# Patient Record
Sex: Male | Born: 1960 | Race: White | Hispanic: No | Marital: Single | Smoking: Former smoker
Health system: Southern US, Community
[De-identification: ages and names within clinical notes are randomized; demographics above are authoritative.]

## PROBLEM LIST (undated history)

## (undated) DIAGNOSIS — K219 Gastro-esophageal reflux disease without esophagitis: Secondary | ICD-10-CM

## (undated) DIAGNOSIS — I214 Non-ST elevation (NSTEMI) myocardial infarction: Secondary | ICD-10-CM

## (undated) DIAGNOSIS — D649 Anemia, unspecified: Secondary | ICD-10-CM

## (undated) DIAGNOSIS — B1920 Unspecified viral hepatitis C without hepatic coma: Secondary | ICD-10-CM

## (undated) DIAGNOSIS — M1990 Unspecified osteoarthritis, unspecified site: Secondary | ICD-10-CM

## (undated) DIAGNOSIS — I5021 Acute systolic (congestive) heart failure: Secondary | ICD-10-CM

## (undated) DIAGNOSIS — R002 Palpitations: Secondary | ICD-10-CM

## (undated) DIAGNOSIS — E785 Hyperlipidemia, unspecified: Secondary | ICD-10-CM

## (undated) DIAGNOSIS — C801 Malignant (primary) neoplasm, unspecified: Secondary | ICD-10-CM

## (undated) HISTORY — DX: Unspecified viral hepatitis C without hepatic coma: B19.20

## (undated) HISTORY — PX: ARTHROSCOPIC REPAIR ACL: SUR80

---

## 1998-03-09 ENCOUNTER — Ambulatory Visit: Admission: RE | Admit: 1998-03-09 | Discharge: 1998-03-09 | Payer: Self-pay | Admitting: Critical Care Medicine

## 2001-12-04 ENCOUNTER — Encounter: Payer: Self-pay | Admitting: Urology

## 2001-12-04 ENCOUNTER — Encounter: Admission: RE | Admit: 2001-12-04 | Discharge: 2001-12-04 | Payer: Self-pay | Admitting: Urology

## 2005-07-26 ENCOUNTER — Ambulatory Visit (HOSPITAL_BASED_OUTPATIENT_CLINIC_OR_DEPARTMENT_OTHER): Admission: RE | Admit: 2005-07-26 | Discharge: 2005-07-26 | Payer: Self-pay | Admitting: Otolaryngology

## 2005-07-29 ENCOUNTER — Ambulatory Visit: Payer: Self-pay | Admitting: Internal Medicine

## 2009-02-28 ENCOUNTER — Encounter: Admission: RE | Admit: 2009-02-28 | Discharge: 2009-02-28 | Payer: Self-pay | Admitting: Gastroenterology

## 2010-07-28 NOTE — Procedures (Signed)
NAMEMARCELO, [name]               ACCOUNT NO.:  [id number]   MEDICAL RECORD NO.:  [id number]          PATIENT TYPE:  OUT   LOCATION:  SLEEP CENTER                 FACILITY:  [facility]   PHYSICIAN:  Clinton D. Maple Hudson, M.D. DATE OF BIRTH:  1960/09/27   DATE OF STUDY:  07/26/2005                              NOCTURNAL POLYSOMNOGRAM   REFERRING PHYSICIAN:  Dr. Hermelinda Medicus   INDICATION FOR STUDY:  Hypersomnia with sleep apnea.   EPWORTH SLEEPINESS SCORE:  14/24, BMI 28.4.  Weight 199 pounds.   HOME MEDICATIONS:  Famvir.  A diagnostic NPSG protocol was requested.   INDICATION FOR STUDY:  Total sleep time 408 minutes with sleep efficiency  89%.  Stage I was 7%, stage II 76%, stages III and IV absent, REM 17% of  total sleep time.  Sleep latency 15 minutes, REM latency 72 minutes, awake  after sleep onset 35 minutes, arousal index 41 indicating increased  fragmentation.  No bedtime medication was taken.   RESPIRATORY DATA:  Apnea/hypopnea index (AHI, RDI) 45.9 obstructive events  per hour indicating moderately severe obstructive sleep apnea/hypopnea  syndrome.  This reflected 281 obstructive apneas and 31 hypopneas.  Events  were not positional.  REM AHI 43.1 per hour.  NPSG protocol was followed.   OXYGEN DATA:  Very loud snoring with oxygen desaturation to a nadir of 72%.  Mean oxygen saturation through the study was 91% on room air.   CARDIAC DATA:  Normal sinus rhythm with PVCs.   MOVEMENT/PARASOMNIA:  A total of 98 limb jerks were recorded of which 15  were associated with arousal or awakening for periodic limb movement with  arousal index of 2.2 per hour which is minimally increased and of doubtful  significance.   IMPRESSION/RECOMMENDATION:  1.  Moderately severe obstructive sleep apnea/hypopnea syndrome, AHI 45.9      per hour with nonpositional events,      loud snoring, and oxygen desaturation to 72%.  2.  Consider return for CPAP titration or evaluate for alternative  therapies      as appropriate.      Clinton D. Maple Hudson, M.D.  Diplomate, Biomedical engineer of Sleep Medicine  Electronically Signed     CDY/MEDQ  D:  07/29/2005 15:13:57  T:  07/30/2005 12:15:56  Job:  [id number]

## 2011-03-16 ENCOUNTER — Other Ambulatory Visit: Payer: Self-pay | Admitting: Gastroenterology

## 2011-03-16 DIAGNOSIS — B182 Chronic viral hepatitis C: Secondary | ICD-10-CM

## 2011-03-19 ENCOUNTER — Ambulatory Visit
Admission: RE | Admit: 2011-03-19 | Discharge: 2011-03-19 | Disposition: A | Discharge status: Home / Self Care | Payer: BC Managed Care – PPO | Source: Ambulatory Visit | Attending: Gastroenterology | Admitting: Gastroenterology

## 2011-03-19 DIAGNOSIS — B182 Chronic viral hepatitis C: Secondary | ICD-10-CM

## 2011-08-13 ENCOUNTER — Telehealth: Payer: Self-pay | Admitting: *Deleted

## 2011-08-13 ENCOUNTER — Telehealth: Payer: Self-pay | Admitting: Internal Medicine

## 2011-08-13 NOTE — Telephone Encounter (Signed)
pt aware of new pt appt for 6/12

## 2011-08-13 NOTE — Telephone Encounter (Signed)
Rec'd referral and call from [facility] with Dr Isabel Caprice, that patient needs a phlebotomy. Informed James [name] that patient will need to establish with Hematologist here before this can be determined. 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 (17.9g/52.5%) Called and informed patient that we would schedule consult appt, and to make sure he eats and drinks well before visit in the event that our attending MD wants him to have a phlebotomy on same day. Patient verbalized understanding. Orders to scheduling, patient request Wednesday afternoons if possible, due to work obligations.

## 2011-08-16 ENCOUNTER — Telehealth: Payer: Self-pay | Admitting: Internal Medicine

## 2011-08-16 NOTE — Telephone Encounter (Signed)
Referred by Dr. Barron Alvine Dx- PCV

## 2011-08-22 ENCOUNTER — Ambulatory Visit (HOSPITAL_BASED_OUTPATIENT_CLINIC_OR_DEPARTMENT_OTHER): Payer: BC Managed Care – PPO

## 2011-08-22 ENCOUNTER — Telehealth: Payer: Self-pay | Admitting: Internal Medicine

## 2011-08-22 ENCOUNTER — Ambulatory Visit: Payer: BC Managed Care – PPO

## 2011-08-22 ENCOUNTER — Other Ambulatory Visit (HOSPITAL_BASED_OUTPATIENT_CLINIC_OR_DEPARTMENT_OTHER): Payer: BC Managed Care – PPO | Admitting: Lab

## 2011-08-22 ENCOUNTER — Other Ambulatory Visit: Payer: Self-pay | Admitting: Internal Medicine

## 2011-08-22 ENCOUNTER — Ambulatory Visit (HOSPITAL_BASED_OUTPATIENT_CLINIC_OR_DEPARTMENT_OTHER): Payer: BC Managed Care – PPO | Admitting: Internal Medicine

## 2011-08-22 ENCOUNTER — Telehealth: Payer: Self-pay | Admitting: *Deleted

## 2011-08-22 VITALS — BP 128/79 | HR 96 | Temp 98.4°F

## 2011-08-22 VITALS — BP 130/80 | HR 86 | Temp 98.3°F | Ht 69.0 in | Wt 196.7 lb

## 2011-08-22 DIAGNOSIS — D751 Secondary polycythemia: Secondary | ICD-10-CM

## 2011-08-22 DIAGNOSIS — D45 Polycythemia vera: Secondary | ICD-10-CM

## 2011-08-22 LAB — CBC & DIFF AND RETIC
BASO%: 0.1 % (ref 0.0–2.0)
Basophils Absolute: 0 10*3/uL (ref 0.0–0.1)
EOS%: 1 % (ref 0.0–7.0)
Eosinophils Absolute: 0.1 10*3/uL (ref 0.0–0.5)
HCT: 50.6 % — ABNORMAL HIGH (ref 38.4–49.9)
HGB: 17.9 g/dL — ABNORMAL HIGH (ref 13.0–17.1)
Immature Retic Fract: 6.2 % (ref 3.00–10.60)
LYMPH%: 17.2 % (ref 14.0–49.0)
MCH: 31.7 pg (ref 27.2–33.4)
MCHC: 35.4 g/dL (ref 32.0–36.0)
MCV: 89.7 fL (ref 79.3–98.0)
MONO#: 0.5 10*3/uL (ref 0.1–0.9)
MONO%: 6.6 % (ref 0.0–14.0)
NEUT#: 5.1 10*3/uL (ref 1.5–6.5)
NEUT%: 75.1 % — ABNORMAL HIGH (ref 39.0–75.0)
Platelets: 212 10*3/uL (ref 140–400)
RBC: 5.64 10*6/uL (ref 4.20–5.82)
RDW: 13.5 % (ref 11.0–14.6)
Retic %: 1.29 % (ref 0.80–1.80)
Retic Ct Abs: 72.76 10*3/uL (ref 34.80–93.90)
WBC: 6.8 10*3/uL (ref 4.0–10.3)
lymph#: 1.2 10*3/uL (ref 0.9–3.3)
nRBC: 0 % (ref 0–0)

## 2011-08-22 LAB — COMPREHENSIVE METABOLIC PANEL
ALT: 33 U/L (ref 0–53)
AST: 27 U/L (ref 0–37)
Albumin: 4 g/dL (ref 3.5–5.2)
Alkaline Phosphatase: 51 U/L (ref 39–117)
BUN: 22 mg/dL (ref 6–23)
CO2: 23 mEq/L (ref 19–32)
Calcium: 9.1 mg/dL (ref 8.4–10.5)
Chloride: 107 mEq/L (ref 96–112)
Creatinine, Ser: 1.08 mg/dL (ref 0.50–1.35)
Glucose, Bld: 96 mg/dL (ref 70–99)
Potassium: 4.1 mEq/L (ref 3.5–5.3)
Sodium: 138 mEq/L (ref 135–145)
Total Bilirubin: 0.5 mg/dL (ref 0.3–1.2)
Total Protein: 6.5 g/dL (ref 6.0–8.3)

## 2011-08-22 LAB — LACTATE DEHYDROGENASE: LDH: 154 U/L (ref 94–250)

## 2011-08-22 NOTE — Telephone Encounter (Signed)
Per staff message I have schedule appts.  JMW

## 2011-08-22 NOTE — Progress Notes (Signed)
Add on phlebotomy today. Pt. Saw Dr. Arbutus Ped.  Written and verbal info. Reviewed with pt. UJ:[id number]. 500g phlebotomized using phlebotomy set and tolerated well.  HL

## 2011-08-22 NOTE — Progress Notes (Signed)
Post phlebotomy: V/s stable.  Discharged in to see scheduling for appts.

## 2011-08-22 NOTE — Telephone Encounter (Signed)
Emailed Michelle regarding phlebotomy °

## 2011-08-22 NOTE — Telephone Encounter (Signed)
Gave pt appt calendar for July 2013 lab, MD, and phlebotomy

## 2011-08-22 NOTE — Patient Instructions (Addendum)
Therapeutic Phlebotomy Therapeutic phlebotomy is the controlled removal of blood from your body for the purpose of treating a medical condition. It is similar to donating blood. Usually, about a pint (470 mL) of blood is removed. The average adult has 9 to 12 pints (4.3 to 5.7 L) of blood. Therapeutic phlebotomy may be used to treat the following medical conditions:  Hemochromatosis. This is a condition in which there is too much iron in the blood.   Polycythemia vera. This is a condition in which there are too many red cells in the blood.   Porphyria cutanea tarda. This is a disease usually passed from one generation to the next (inherited). It is a condition in which an important part of hemoglobin is not made properly. This results in the build up of abnormal amounts of porphyrins in the body.   Sickle cell disease. This is an inherited disease. It is a condition in which the red blood cells form an abnormal crescent shape rather than a round shape.  LET YOUR CAREGIVER KNOW ABOUT:  Allergies.   Medicines taken including herbs, eyedrops, over-the-counter medicines, and creams.   Use of steroids (by mouth or creams).   Previous problems with anesthetics or numbing medicine.   History of blood clots.   History of bleeding or blood problems.   Previous surgery.   Possibility of pregnancy, if this applies.  RISKS AND COMPLICATIONS This is a simple and safe procedure. Problems are unlikely. However, problems can occur and may include:  Nausea or lightheadedness.   Low blood pressure.   Soreness, bleeding, swelling, or bruising at the needle insertion site.   Infection.  BEFORE THE PROCEDURE  This is a procedure that can be done as an outpatient. Confirm the time that you need to arrive for your procedure. Confirm whether there is a need to fast or withhold any medications. It is helpful to wear clothing with sleeves that can be raised above the elbow. A blood sample may be done  to determine the amount of red blood cells or iron in your blood. Plan ahead of time to have someone drive you home after the procedure. PROCEDURE The entire procedure from preparation through recovery takes about 1 hour. The actual collection takes about 10 to 15 minutes.  A needle will be inserted into your vein.   Tubing and a collection bag will be attached to that needle.   Blood will flow through the needle and tubing into the collection bag.   You may be asked to open and close your hand slowly and continuously during the entire collection.   Once the specified amount of blood has been removed from your body, the collection bag and tubing will be clamped.   The needle will be removed.   Pressure will be held on the site of the needle insertion to stop the bleeding. Then a bandage will be placed over the needle insertion site.  AFTER THE PROCEDURE  Your recovery will be assessed and monitored. If there are no problems, as an outpatient, you should be able to go home shortly after the procedure.  Document Released: 07/31/2010 Document Revised: 02/15/2011 Document Reviewed: 07/31/2010 [facility] Patient Information 2012 [place], [place].   PLEASE CALL [facility] [phone] IF YOU HAVE FURTHER QUESTIONS OR CALL 911 AND GO TO THE NEAREST EMERGENCY ROOM FOR ANY TYPE OF EMERGENCY ISSUES.

## 2011-08-22 NOTE — Progress Notes (Signed)
[facility] Telephone:([phone]) [phone]   Fax:([phone]) [phone]  CONSULT NOTE  REASON FOR CONSULTATION:  51 years old white male with questionable polycythemia vera  HPI James [name] is a 51 y.o. male was past medical history significant for hypogonadism currently on androgen gell and testosterone. He also has a history of benign prostatic hypertrophy, erectile dysfunction and hepatitis C after transfusion from an accident several years ago. The patient is followed by Dr. Isabel Caprice for evaluation of his hypogonadism and on routine blood work was found to have elevated hemoglobin and hematocrit suspicious for polycythemia vera. He was referred to me today for further evaluation and recommendation regarding this abnormality. The patient is feeling fine with no specific complaints. He denied having any fatigue weakness. No history of thrombosis or bleeding. He has no headache or visual changes. Ultrasound of the abdomen on 03/19/2011 showed normal spleen size and echotexture. On 04/16/2011 his hemoglobin was 17.9 and hematocrit of 52.5%. The patient has no family history of blood disease or polycythemia vera.  Past medical history significant for: #1 hypogonadism. #2 benign prostatic hypertrophy #3 erectile dysfunction #4 history of hepatitis C after transfusion The patient denied having any history of hypertension, diabetes mellitus, coronary artery disease or stroke.  Family history: Mother is alive and healthy, father has coronary artery disease.  Social history: The patient is separated, he has 2 daughters. He works as a Administrator. He has remote history of smoking and quit 15 years ago, no history of alcohol or drug abuse. He is very active and exercise on a regular basis.  No Known Allergies  Current Outpatient Prescriptions  Medication Sig Dispense Refill  . Aspirin-Acetaminophen-Caffeine (GOODY HEADACHE PO) Take by mouth as needed.        Review of  Systems  A comprehensive review of systems was negative.  Physical Exam  AVW:[id number], healthy, no distress, well nourished and well developed SKIN: skin color, texture, turgor are normal HEAD: Normocephalic, No masses, lesions, tenderness or abnormalities EYES: normal EARS: External ears normal OROPHARYNX:no exudate and no erythema  NECK: supple, no adenopathy LYMPH:  no palpable lymphadenopathy, no hepatosplenomegaly LUNGS: clear to auscultation  HEART: regular rate & rhythm, no murmurs and no gallops ABDOMEN:abdomen soft, non-tender, normal bowel sounds and no masses or organomegaly BACK: Back symmetric, no curvature. EXTREMITIES:no joint deformities, effusion, or inflammation, no edema, no skin discoloration, no clubbing, no cyanosis  NEURO: alert & oriented x 3 with fluent speech, no focal motor/sensory deficits, gait normal  PERFORMANCE STATUS: ECOG 0  Studies/Results: CBC today showed white blood count 6.8, hemoglobin 17.9, hematocrit 50.6% and platelets count 212,000.  ASSESSMENT: This is a very pleasant 51 years old white male with polycythemia most likely reactive in nature secondary to hormonal treatment with testosterone and AndroGel but he cannot rule out polycythemia vera at this point.  PLAN: I have a lengthy discussion with the patient about his condition. I ordered several studies to evaluate the etiology of his polycythemia including repeat CBC, erythropoietin level, iron study and ferritin in addition to JAK-2 mutation. I will arrange for the patient to have a phlebotomy performed today with a target hematocrit of 45%.   The patient would come back for followup visit in 3 weeks for reevaluation and repeat phlebotomy as needed. I give the patient the time to ask questions and I answered them completely to his satisfaction.  All questions were answered. The patient knows to call the clinic with any problems, questions or concerns.  We can certainly see the patient much  sooner if necessary.  Thank you so much for allowing me to participate in the care of James [name]. I will continue to follow up the patient with you and assist in his care.  I spent 30 minutes counseling the patient face to face. The total time spent in the appointment was 55 minutes.  Kayton Ripp K. 08/22/2011, 10:49 AM

## 2011-08-23 LAB — IRON AND TIBC
%SAT: 35 % (ref 20–55)
Iron: 118 ug/dL (ref 42–165)
TIBC: 334 ug/dL (ref 215–435)
UIBC: 216 ug/dL (ref 125–400)

## 2011-08-23 LAB — ERYTHROPOIETIN: Erythropoietin: 26 m[IU]/mL (ref 2.6–34.0)

## 2011-08-23 LAB — FERRITIN: Ferritin: 148 ng/mL (ref 22–322)

## 2011-09-03 ENCOUNTER — Telehealth: Payer: Self-pay | Admitting: Internal Medicine

## 2011-09-03 NOTE — Telephone Encounter (Signed)
Talked to pt, he is aware of all appt in July 2013, lab, ML and phlebotomy

## 2011-09-12 ENCOUNTER — Telehealth: Payer: Self-pay | Admitting: Internal Medicine

## 2011-09-12 ENCOUNTER — Ambulatory Visit (HOSPITAL_BASED_OUTPATIENT_CLINIC_OR_DEPARTMENT_OTHER): Payer: BC Managed Care – PPO | Admitting: Physician Assistant

## 2011-09-12 ENCOUNTER — Ambulatory Visit (HOSPITAL_BASED_OUTPATIENT_CLINIC_OR_DEPARTMENT_OTHER): Payer: BC Managed Care – PPO

## 2011-09-12 ENCOUNTER — Other Ambulatory Visit (HOSPITAL_BASED_OUTPATIENT_CLINIC_OR_DEPARTMENT_OTHER): Payer: BC Managed Care – PPO | Admitting: Lab

## 2011-09-12 ENCOUNTER — Encounter: Payer: Self-pay | Admitting: Physician Assistant

## 2011-09-12 VITALS — BP 130/80 | HR 82 | Temp 98.7°F | Ht 69.0 in | Wt 194.7 lb

## 2011-09-12 DIAGNOSIS — D751 Secondary polycythemia: Secondary | ICD-10-CM

## 2011-09-12 DIAGNOSIS — D45 Polycythemia vera: Secondary | ICD-10-CM

## 2011-09-12 LAB — CBC WITH DIFFERENTIAL/PLATELET
BASO%: 0.4 % (ref 0.0–2.0)
Basophils Absolute: 0 10*3/uL (ref 0.0–0.1)
EOS%: 1 % (ref 0.0–7.0)
Eosinophils Absolute: 0.1 10*3/uL (ref 0.0–0.5)
HCT: 49 % (ref 38.4–49.9)
HGB: 16.8 g/dL (ref 13.0–17.1)
LYMPH%: 14.8 % (ref 14.0–49.0)
MCH: 32.2 pg (ref 27.2–33.4)
MCHC: 34.3 g/dL (ref 32.0–36.0)
MCV: 93.7 fL (ref 79.3–98.0)
MONO#: 0.5 10*3/uL (ref 0.1–0.9)
MONO%: 7.5 % (ref 0.0–14.0)
NEUT#: 5.5 10*3/uL (ref 1.5–6.5)
NEUT%: 76.3 % — ABNORMAL HIGH (ref 39.0–75.0)
Platelets: 225 10*3/uL (ref 140–400)
RBC: 5.23 10*6/uL (ref 4.20–5.82)
RDW: 13.9 % (ref 11.0–14.6)
WBC: 7.2 10*3/uL (ref 4.0–10.3)
lymph#: 1.1 10*3/uL (ref 0.9–3.3)

## 2011-09-12 NOTE — Progress Notes (Signed)
One unit of blood drained from left AC w/o difficulty. Pt tolerated well.  Pt ate snack, sandwich, crackers cheese and drank a coke.

## 2011-09-12 NOTE — Patient Instructions (Addendum)
Therapeutic Phlebotomy Therapeutic phlebotomy is the controlled removal of blood from your body for the purpose of treating a medical condition. It is similar to donating blood. Usually, about a pint (470 mL) of blood is removed. The average adult has 9 to 12 pints (4.3 to 5.7 L) of blood. Therapeutic phlebotomy may be used to treat the following medical conditions:  Hemochromatosis. This is a condition in which there is too much iron in the blood.   Polycythemia vera. This is a condition in which there are too many red cells in the blood.   Porphyria cutanea tarda. This is a disease usually passed from one generation to the next (inherited). It is a condition in which an important part of hemoglobin is not made properly. This results in the build up of abnormal amounts of porphyrins in the body.   Sickle cell disease. This is an inherited disease. It is a condition in which the red blood cells form an abnormal crescent shape rather than a round shape.  LET YOUR CAREGIVER KNOW ABOUT:  Allergies.   Medicines taken including herbs, eyedrops, over-the-counter medicines, and creams.   Use of steroids (by mouth or creams).   Previous problems with anesthetics or numbing medicine.   History of blood clots.   History of bleeding or blood problems.   Previous surgery.   Possibility of pregnancy, if this applies.  RISKS AND COMPLICATIONS This is a simple and safe procedure. Problems are unlikely. However, problems can occur and may include:  Nausea or lightheadedness.   Low blood pressure.   Soreness, bleeding, swelling, or bruising at the needle insertion site.   Infection.  BEFORE THE PROCEDURE  This is a procedure that can be done as an outpatient. Confirm the time that you need to arrive for your procedure. Confirm whether there is a need to fast or withhold any medications. It is helpful to wear clothing with sleeves that can be raised above the elbow. A blood sample may be done  to determine the amount of red blood cells or iron in your blood. Plan ahead of time to have someone drive you home after the procedure. PROCEDURE The entire procedure from preparation through recovery takes about 1 hour. The actual collection takes about 10 to 15 minutes.  A needle will be inserted into your vein.   Tubing and a collection bag will be attached to that needle.   Blood will flow through the needle and tubing into the collection bag.   You may be asked to open and close your hand slowly and continuously during the entire collection.   Once the specified amount of blood has been removed from your body, the collection bag and tubing will be clamped.   The needle will be removed.   Pressure will be held on the site of the needle insertion to stop the bleeding. Then a bandage will be placed over the needle insertion site.  AFTER THE PROCEDURE  Your recovery will be assessed and monitored. If there are no problems, as an outpatient, you should be able to go home shortly after the procedure.  Document Released: 07/31/2010 Document Revised: 02/15/2011 Document Reviewed: 07/31/2010 [employer] Patient Information 2012 [employer]. 

## 2011-09-12 NOTE — Telephone Encounter (Signed)
Gave pt appt for July 31st lab, Ml and phlebotomy

## 2011-09-12 NOTE — Progress Notes (Signed)
No images are attached to the encounter. No scans are attached to the encounter. No scans are attached to the encounter. [facility] OFFICE PROGRESS NOTE  No primary provider on file. No primary provider on file.  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 male returns for a scheduled regular office visit for followup of his reactive polycythemia. He has done well since last being seen by Dr. Arbutus Ped. He denied any problems with headache blurred or double vision, no chest pain or shortness of breath. He continues on his testosterone without difficulty. His JAK tube was negative. His ferritin and erythropoietin and iron studies were all within normal range. So that he is reactive polycythemia. Our goal is to keep his hematocrit at 45% or less the phlebotomy on an as-needed basis. He voices no specific complaints today.   MEDICAL HISTORY:History reviewed. No pertinent past medical history.  ALLERGIES:   has no known allergies.  MEDICATIONS:  Current Outpatient Prescriptions  Medication Sig Dispense Refill  . Aspirin-Acetaminophen-Caffeine (GOODY HEADACHE PO) Take by mouth as needed.      . TESTIM 50 MG/5GM GEL       . valACYclovir (VALTREX) 500 MG tablet         SURGICAL HISTORY: History reviewed. No pertinent past surgical history.  REVIEW OF SYSTEMS:  A comprehensive review of systems was negative.   PHYSICAL EXAMINATION: General appearance: alert, cooperative, appears stated age and no distress Head: Normocephalic, without obvious abnormality, atraumatic Neck: no adenopathy, no carotid bruit, no JVD, supple, symmetrical, trachea midline and thyroid not enlarged, symmetric, no tenderness/mass/nodules Lymph nodes: Cervical, supraclavicular, and axillary nodes normal. Resp: clear to auscultation  bilaterally Cardio: regular rate and rhythm, S1, S2 normal, no murmur, click, rub or gallop GI: soft, non-tender; bowel sounds normal; no masses,  no organomegaly Extremities: extremities normal, atraumatic, no cyanosis or edema Neurologic: Alert and oriented X 3, normal strength and tone. Normal symmetric reflexes. Normal coordination and gait  ECOG PERFORMANCE STATUS: 0 - Asymptomatic  Blood pressure 130/80, pulse 82, temperature 98.7 F (37.1 C), temperature source Oral, height 5\' 9"  (1.753 m), weight 194 lb 11.2 oz (88.315 kg).  LABORATORY DATA: Lab Results  Component Value Date   WBC 7.2 09/12/2011   HGB 16.8 09/12/2011   HCT 49.0 09/12/2011   MCV 93.7 09/12/2011   PLT 225 09/12/2011      Chemistry      Component Value Date/Time   NA 138 08/22/2011 0923   K 4.1 08/22/2011 0923   CL 107 08/22/2011 0923   CO2 23 08/22/2011 0923   BUN 22 08/22/2011 0923   CREATININE 1.08 08/22/2011 0923      Component Value Date/Time   CALCIUM 9.1 08/22/2011 0923   ALKPHOS 51 08/22/2011 0923   AST 27 08/22/2011 0923   ALT 33 08/22/2011 0923   BILITOT 0.5 08/22/2011 0923       RADIOGRAPHIC STUDIES:  No results found.   ASSESSMENT/PLAN: The patient is a very pleasant 51 year old white male with reactive polycythemia. Patient was discussed with Dr. Arbutus Ped. His hematocrit today is 49% and he will require phlebotomy. We'll have her return in one month with a repeat CBC to reevaluate whether she will need further phlebotomy to reach his goals of hematocrit of 45% or less. We'll continue monthly checks and a phlebotomy as needed.  Laural Benes, Lalonnie Shaffer E, PA-C     All questions were answered. The patient knows to call the clinic with any problems, questions or concerns. We can certainly see the patient much sooner if necessary.  I spent 20 minutes counseling the patient face to face. The total time spent in the appointment was 30 minutes.

## 2011-10-10 ENCOUNTER — Ambulatory Visit (HOSPITAL_BASED_OUTPATIENT_CLINIC_OR_DEPARTMENT_OTHER): Payer: BC Managed Care – PPO | Admitting: Physician Assistant

## 2011-10-10 ENCOUNTER — Other Ambulatory Visit (HOSPITAL_BASED_OUTPATIENT_CLINIC_OR_DEPARTMENT_OTHER): Payer: BC Managed Care – PPO | Admitting: Lab

## 2011-10-10 ENCOUNTER — Ambulatory Visit (HOSPITAL_BASED_OUTPATIENT_CLINIC_OR_DEPARTMENT_OTHER): Payer: BC Managed Care – PPO

## 2011-10-10 VITALS — BP 135/76 | HR 84 | Temp 97.2°F | Ht 69.0 in | Wt 196.3 lb

## 2011-10-10 DIAGNOSIS — D45 Polycythemia vera: Secondary | ICD-10-CM

## 2011-10-10 DIAGNOSIS — D751 Secondary polycythemia: Secondary | ICD-10-CM

## 2011-10-10 LAB — CBC WITH DIFFERENTIAL/PLATELET
BASO%: 0.2 % (ref 0.0–2.0)
Basophils Absolute: 0 10*3/uL (ref 0.0–0.1)
EOS%: 1.6 % (ref 0.0–7.0)
Eosinophils Absolute: 0.1 10*3/uL (ref 0.0–0.5)
HCT: 50 % — ABNORMAL HIGH (ref 38.4–49.9)
HGB: 17 g/dL (ref 13.0–17.1)
LYMPH%: 18.2 % (ref 14.0–49.0)
MCH: 31.9 pg (ref 27.2–33.4)
MCHC: 34 g/dL (ref 32.0–36.0)
MCV: 93.8 fL (ref 79.3–98.0)
MONO#: 0.7 10*3/uL (ref 0.1–0.9)
MONO%: 8.3 % (ref 0.0–14.0)
NEUT#: 5.9 10*3/uL (ref 1.5–6.5)
NEUT%: 71.7 % (ref 39.0–75.0)
Platelets: 244 10*3/uL (ref 140–400)
RBC: 5.33 10*6/uL (ref 4.20–5.82)
RDW: 13.3 % (ref 11.0–14.6)
WBC: 8.2 10*3/uL (ref 4.0–10.3)
lymph#: 1.5 10*3/uL (ref 0.9–3.3)

## 2011-10-10 NOTE — Patient Instructions (Signed)
Patient aware of next appointment; discharged home with no complaints. 

## 2011-10-10 NOTE — Progress Notes (Signed)
Therapeutic range for Hct = > 45, per Dr. Arbutus Ped; Hct = 50 today; removed 500cc blood; patient tolerated well; pre and post vital signs stable.

## 2011-10-14 NOTE — Progress Notes (Signed)
No images are attached to the encounter. No scans are attached to the encounter. No scans are attached to the encounter. [facility] OFFICE PROGRESS NOTE  No primary provider on file. No primary provider on file.  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 male returns for a scheduled regular office visit for followup of his reactive polycythemia. He voices no complaints.  He denied any problems with headache blurred or double vision, no chest pain or shortness of breath. He continues on his testosterone without difficulty.  Our goal is to keep his hematocrit at 45% or less the phlebotomy on an as-needed basis. He voices no specific complaints today.   MEDICAL HISTORY:No past medical history on file.  ALLERGIES:   has no known allergies.  MEDICATIONS:  Current Outpatient Prescriptions  Medication Sig Dispense Refill  . Aspirin-Acetaminophen-Caffeine (GOODY HEADACHE PO) Take by mouth as needed.      . imiquimod (ALDARA) 5 % cream       . TESTIM 50 MG/5GM GEL       . valACYclovir (VALTREX) 500 MG tablet         SURGICAL HISTORY: No past surgical history on file.  REVIEW OF SYSTEMS:  A comprehensive review of systems was negative.   PHYSICAL EXAMINATION: General appearance: alert, cooperative, appears stated age and no distress Head: Normocephalic, without obvious abnormality, atraumatic Neck: no adenopathy, no carotid bruit, no JVD, supple, symmetrical, trachea midline and thyroid not enlarged, symmetric, no tenderness/mass/nodules Lymph nodes: Cervical, supraclavicular, and axillary nodes normal. Resp: clear to auscultation bilaterally Cardio: regular rate and rhythm, S1, S2 normal, no murmur, click, rub or gallop GI: soft, non-tender; bowel sounds normal; no masses,  no organomegaly Extremities:  extremities normal, atraumatic, no cyanosis or edema Neurologic: Alert and oriented X 3, normal strength and tone. Normal symmetric reflexes. Normal coordination and gait  ECOG PERFORMANCE STATUS: 0 - Asymptomatic  Blood pressure 135/76, pulse 84, temperature 97.2 F (36.2 C), temperature source Oral, height 5\' 9"  (1.753 m), weight 196 lb 4.8 oz (89.041 kg).  LABORATORY DATA: Lab Results  Component Value Date   WBC 8.2 10/10/2011   HGB 17.0 10/10/2011   HCT 50.0* 10/10/2011   MCV 93.8 10/10/2011   PLT 244 10/10/2011      Chemistry      Component Value Date/Time   NA 138 08/22/2011 0923   K 4.1 08/22/2011 0923   CL 107 08/22/2011 0923   CO2 23 08/22/2011 0923   BUN 22 08/22/2011 0923   CREATININE 1.08 08/22/2011 0923      Component Value Date/Time   CALCIUM 9.1 08/22/2011 0923   ALKPHOS 51 08/22/2011 0923   AST 27 08/22/2011 0923   ALT 33 08/22/2011 0923   BILITOT 0.5 08/22/2011 0923       RADIOGRAPHIC STUDIES:  No results found.   ASSESSMENT/PLAN: The patient is a very pleasant 51 year old white male with reactive polycythemia. Patient was discussed with Dr. Arbutus Ped. His hematocrit today is 50% and he will require phlebotomy. We'll have him return on a monthly basis with a CBC to reevaluate whether he will require phlebotomy and we'll also set up phlebotomy appointments. Should the hematocrit be greater than 45% he will require phlebotomy. He'll followup with Dr. Arbutus Ped in 3 months with repeat CBC and phlebotomy appointment as well.  James [name], James Pickelsimer E, PA-C     All questions were answered. The patient knows to call the clinic with any problems, questions or concerns. We can certainly see the patient much sooner if necessary.  I spent 20 minutes counseling the patient face to face. The total time spent in the appointment was 30 minutes.

## 2011-11-13 ENCOUNTER — Other Ambulatory Visit: Payer: BC Managed Care – PPO | Admitting: Lab

## 2011-11-13 ENCOUNTER — Telehealth: Payer: Self-pay | Admitting: Internal Medicine

## 2011-11-13 NOTE — Telephone Encounter (Signed)
pt called to r/s 9/3 lab to 9/11,done

## 2011-11-21 ENCOUNTER — Other Ambulatory Visit (HOSPITAL_BASED_OUTPATIENT_CLINIC_OR_DEPARTMENT_OTHER): Payer: BC Managed Care – PPO

## 2011-11-21 DIAGNOSIS — D751 Secondary polycythemia: Secondary | ICD-10-CM

## 2011-11-21 DIAGNOSIS — D45 Polycythemia vera: Secondary | ICD-10-CM

## 2011-11-21 LAB — CBC WITH DIFFERENTIAL/PLATELET
BASO%: 0.3 % (ref 0.0–2.0)
Basophils Absolute: 0 10*3/uL (ref 0.0–0.1)
EOS%: 1.7 % (ref 0.0–7.0)
Eosinophils Absolute: 0.1 10*3/uL (ref 0.0–0.5)
HCT: 47.6 % (ref 38.4–49.9)
HGB: 16.5 g/dL (ref 13.0–17.1)
LYMPH%: 23.7 % (ref 14.0–49.0)
MCH: 31 pg (ref 27.2–33.4)
MCHC: 34.7 g/dL (ref 32.0–36.0)
MCV: 89.3 fL (ref 79.3–98.0)
MONO#: 0.5 10*3/uL (ref 0.1–0.9)
MONO%: 6.5 % (ref 0.0–14.0)
NEUT#: 4.9 10*3/uL (ref 1.5–6.5)
NEUT%: 67.8 % (ref 39.0–75.0)
Platelets: 221 10*3/uL (ref 140–400)
RBC: 5.33 10*6/uL (ref 4.20–5.82)
RDW: 12.9 % (ref 11.0–14.6)
WBC: 7.2 10*3/uL (ref 4.0–10.3)
lymph#: 1.7 10*3/uL (ref 0.9–3.3)

## 2012-01-09 ENCOUNTER — Telehealth: Payer: Self-pay | Admitting: *Deleted

## 2012-01-09 ENCOUNTER — Ambulatory Visit (HOSPITAL_BASED_OUTPATIENT_CLINIC_OR_DEPARTMENT_OTHER): Payer: BC Managed Care – PPO | Admitting: Internal Medicine

## 2012-01-09 ENCOUNTER — Other Ambulatory Visit (HOSPITAL_BASED_OUTPATIENT_CLINIC_OR_DEPARTMENT_OTHER): Payer: BC Managed Care – PPO | Admitting: Lab

## 2012-01-09 ENCOUNTER — Ambulatory Visit (HOSPITAL_BASED_OUTPATIENT_CLINIC_OR_DEPARTMENT_OTHER): Payer: BC Managed Care – PPO

## 2012-01-09 VITALS — BP 120/72 | HR 81 | Temp 98.0°F | Resp 20 | Ht 69.0 in | Wt 202.0 lb

## 2012-01-09 DIAGNOSIS — D45 Polycythemia vera: Secondary | ICD-10-CM

## 2012-01-09 DIAGNOSIS — D751 Secondary polycythemia: Secondary | ICD-10-CM

## 2012-01-09 LAB — COMPREHENSIVE METABOLIC PANEL (CC13)
ALT: 35 U/L (ref 0–55)
AST: 25 U/L (ref 5–34)
Albumin: 3.7 g/dL (ref 3.5–5.0)
Alkaline Phosphatase: 60 U/L (ref 40–150)
BUN: 23 mg/dL (ref 7.0–26.0)
CO2: 24 mEq/L (ref 22–29)
Calcium: 9.3 mg/dL (ref 8.4–10.4)
Chloride: 107 mEq/L (ref 98–107)
Creatinine: 1 mg/dL (ref 0.7–1.3)
Glucose: 101 mg/dl — ABNORMAL HIGH (ref 70–99)
Potassium: 4.5 mEq/L (ref 3.5–5.1)
Sodium: 137 mEq/L (ref 136–145)
Total Bilirubin: 0.39 mg/dL (ref 0.20–1.20)
Total Protein: 6.7 g/dL (ref 6.4–8.3)

## 2012-01-09 LAB — CBC WITH DIFFERENTIAL/PLATELET
BASO%: 0.5 % (ref 0.0–2.0)
Basophils Absolute: 0 10*3/uL (ref 0.0–0.1)
EOS%: 2.2 % (ref 0.0–7.0)
Eosinophils Absolute: 0.2 10*3/uL (ref 0.0–0.5)
HCT: 50.5 % — ABNORMAL HIGH (ref 38.4–49.9)
HGB: 17.4 g/dL — ABNORMAL HIGH (ref 13.0–17.1)
LYMPH%: 21.1 % (ref 14.0–49.0)
MCH: 31.1 pg (ref 27.2–33.4)
MCHC: 34.5 g/dL (ref 32.0–36.0)
MCV: 90.1 fL (ref 79.3–98.0)
MONO#: 0.5 10*3/uL (ref 0.1–0.9)
MONO%: 7.4 % (ref 0.0–14.0)
NEUT#: 5 10*3/uL (ref 1.5–6.5)
NEUT%: 68.8 % (ref 39.0–75.0)
Platelets: 200 10*3/uL (ref 140–400)
RBC: 5.61 10*6/uL (ref 4.20–5.82)
RDW: 14.1 % (ref 11.0–14.6)
WBC: 7.3 10*3/uL (ref 4.0–10.3)
lymph#: 1.5 10*3/uL (ref 0.9–3.3)

## 2012-01-09 LAB — LACTATE DEHYDROGENASE (CC13): LDH: 199 U/L (ref 125–220)

## 2012-01-09 NOTE — Telephone Encounter (Signed)
Per staff message and POF I ave scheduled appts.  JMW  

## 2012-01-09 NOTE — Telephone Encounter (Signed)
gv and printed appt schedule for pt for NOV °

## 2012-01-09 NOTE — Progress Notes (Signed)
      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  PRIOR THERAPY: None   CURRENT THERAPY: Phlebotomy on an as-needed basis noted to keep the hematocrit at 45% or less   INTERVAL HISTORY: James [name] 51 y.o. male returns to the clinic today for routine two-month followup visit. The patient is feeling fine today with no specific complaints. He denied having any significant weight loss or night sweats. He denied having any chest pain, shortness breath, cough or hemoptysis. He has no bleeding issues or palpable lymphadenopathy. He has repeat CBC performed earlier today and he is here for evaluation and discussion of his lab results. He still on treatment with testosterone gel.  MEDICAL HISTORY:No past medical history on file.  ALLERGIES:   has no known allergies.  MEDICATIONS:  Current Outpatient Prescriptions  Medication Sig Dispense Refill  . Aspirin-Acetaminophen-Caffeine (GOODY HEADACHE PO) Take by mouth as needed.      . imiquimod (ALDARA) 5 % cream       . TESTIM 50 MG/5GM GEL       . valACYclovir (VALTREX) 500 MG tablet         REVIEW OF SYSTEMS:  A comprehensive review of systems was negative.   PHYSICAL EXAMINATION: General appearance: alert, cooperative and no distress Head: Normocephalic, without obvious abnormality, atraumatic Neck: no adenopathy Lymph nodes: Cervical, supraclavicular, and axillary nodes normal. Resp: clear to auscultation bilaterally Cardio: regular rate and rhythm, S1, S2 normal, no murmur, click, rub or gallop GI: soft, non-tender; bowel sounds normal; no masses,  no organomegaly Extremities: extremities normal, atraumatic, no cyanosis or edema  ECOG PERFORMANCE STATUS: 0 - Asymptomatic  Blood pressure 120/72, pulse 81, temperature 98 F (36.7 C), temperature source Oral, resp. rate 20, height 5\' 9"   (1.753 m), weight 202 lb (91.627 kg).  LABORATORY DATA: Lab Results  Component Value Date   WBC 7.3 01/09/2012   HGB 17.4* 01/09/2012   HCT 50.5* 01/09/2012   MCV 90.1 01/09/2012   PLT 200 01/09/2012      Chemistry      Component Value Date/Time   NA 138 08/22/2011 0923   K 4.1 08/22/2011 0923   CL 107 08/22/2011 0923   CO2 23 08/22/2011 0923   BUN 22 08/22/2011 0923   CREATININE 1.08 08/22/2011 0923      Component Value Date/Time   CALCIUM 9.1 08/22/2011 0923   ALKPHOS 51 08/22/2011 0923   AST 27 08/22/2011 0923   ALT 33 08/22/2011 0923   BILITOT 0.5 08/22/2011 0923       RADIOGRAPHIC STUDIES: No results found.  ASSESSMENT: This is a very pleasant 51 years old white male with reactive polycythemia most likely secondary to his hormonal treatment with testosterone gel. His hemoglobin and hematocrit are elevated today.  PLAN: I recommended for him to proceed with phlebotomy today to keep his hematocrit around 45%. I would see him back for followup visit in one month with repeat CBC and consideration of phlebotomy if his hematocrit is still elevated. The patient was advised to call immediately if he has any concerning symptoms in the interval.  All questions were answered. The patient knows to call the clinic with any problems, questions or concerns. We can certainly see the patient much sooner if necessary.

## 2012-01-09 NOTE — Patient Instructions (Signed)
Your hemoglobin and hematocrit are elevated today. We'll proceed with phlebotomy today and in one month

## 2012-01-09 NOTE — Progress Notes (Signed)
1458-Therapeutic phlebotomy complete.  516g blood removed.  Pt tolerated procedure well without complaints.  Nourishments provided-dhp, rn 1320- Pt states he feels well, would like to go home.  VSS.  D/c'd home-dhp, rn

## 2012-01-09 NOTE — Patient Instructions (Addendum)
Therapeutic Phlebotomy Therapeutic phlebotomy is the controlled removal of blood from your body for the purpose of treating a medical condition. It is similar to donating blood. Usually, about a pint (470 mL) of blood is removed. The average adult has 9 to 12 pints (4.3 to 5.7 L) of blood. Therapeutic phlebotomy may be used to treat the following medical conditions:  Hemochromatosis. This is a condition in which there is too much iron in the blood.  Polycythemia vera. This is a condition in which there are too many red cells in the blood.  Porphyria cutanea tarda. This is a disease usually passed from one generation to the next (inherited). It is a condition in which an important part of hemoglobin is not made properly. This results in the build up of abnormal amounts of porphyrins in the body.  Sickle cell disease. This is an inherited disease. It is a condition in which the red blood cells form an abnormal crescent shape rather than a round shape. LET YOUR CAREGIVER KNOW ABOUT:  Allergies.  Medicines taken including herbs, eyedrops, over-the-counter medicines, and creams.  Use of steroids (by mouth or creams).  Previous problems with anesthetics or numbing medicine.  History of blood clots.  History of bleeding or blood problems.  Previous surgery.  Possibility of pregnancy, if this applies. RISKS AND COMPLICATIONS This is a simple and safe procedure. Problems are unlikely. However, problems can occur and may include:  Nausea or lightheadedness.  Low blood pressure.  Soreness, bleeding, swelling, or bruising at the needle insertion site.  Infection. BEFORE THE PROCEDURE  This is a procedure that can be done as an outpatient. Confirm the time that you need to arrive for your procedure. Confirm whether there is a need to fast or withhold any medications. It is helpful to wear clothing with sleeves that can be raised above the elbow. A blood sample may be done to determine the  amount of red blood cells or iron in your blood. Plan ahead of time to have someone drive you home after the procedure. PROCEDURE The entire procedure from preparation through recovery takes about 1 hour. The actual collection takes about 10 to 15 minutes.  A needle will be inserted into your vein.  Tubing and a collection bag will be attached to that needle.  Blood will flow through the needle and tubing into the collection bag.  You may be asked to open and close your hand slowly and continuously during the entire collection.  Once the specified amount of blood has been removed from your body, the collection bag and tubing will be clamped.  The needle will be removed.  Pressure will be held on the site of the needle insertion to stop the bleeding. Then a bandage will be placed over the needle insertion site. AFTER THE PROCEDURE  Your recovery will be assessed and monitored. If there are no problems, as an outpatient, you should be able to go home shortly after the procedure.  Document Released: 07/31/2010 Document Revised: 05/21/2011 Document Reviewed: 07/31/2010 [employer] Patient Information 2013 [employer].  

## 2012-02-06 ENCOUNTER — Other Ambulatory Visit: Payer: BC Managed Care – PPO | Admitting: Lab

## 2012-02-06 ENCOUNTER — Ambulatory Visit: Payer: BC Managed Care – PPO | Admitting: Physician Assistant

## 2012-02-20 ENCOUNTER — Encounter: Payer: Self-pay | Admitting: Physician Assistant

## 2012-02-20 ENCOUNTER — Telehealth: Payer: Self-pay | Admitting: Internal Medicine

## 2012-02-20 ENCOUNTER — Other Ambulatory Visit (HOSPITAL_BASED_OUTPATIENT_CLINIC_OR_DEPARTMENT_OTHER): Payer: BC Managed Care – PPO | Admitting: Lab

## 2012-02-20 ENCOUNTER — Telehealth: Payer: Self-pay | Admitting: *Deleted

## 2012-02-20 ENCOUNTER — Ambulatory Visit (HOSPITAL_BASED_OUTPATIENT_CLINIC_OR_DEPARTMENT_OTHER): Payer: BC Managed Care – PPO | Admitting: Physician Assistant

## 2012-02-20 ENCOUNTER — Ambulatory Visit (HOSPITAL_BASED_OUTPATIENT_CLINIC_OR_DEPARTMENT_OTHER): Payer: BC Managed Care – PPO

## 2012-02-20 VITALS — BP 122/82 | HR 84 | Temp 98.1°F | Resp 18 | Ht 69.0 in | Wt 201.7 lb

## 2012-02-20 DIAGNOSIS — D751 Secondary polycythemia: Secondary | ICD-10-CM

## 2012-02-20 DIAGNOSIS — D45 Polycythemia vera: Secondary | ICD-10-CM

## 2012-02-20 LAB — CBC WITH DIFFERENTIAL/PLATELET
BASO%: 0.7 % (ref 0.0–2.0)
Basophils Absolute: 0 10*3/uL (ref 0.0–0.1)
EOS%: 1.8 % (ref 0.0–7.0)
Eosinophils Absolute: 0.1 10*3/uL (ref 0.0–0.5)
HCT: 49.3 % (ref 38.4–49.9)
HGB: 16.8 g/dL (ref 13.0–17.1)
LYMPH%: 20 % (ref 14.0–49.0)
MCH: 31.2 pg (ref 27.2–33.4)
MCHC: 34 g/dL (ref 32.0–36.0)
MCV: 91.8 fL (ref 79.3–98.0)
MONO#: 0.5 10*3/uL (ref 0.1–0.9)
MONO%: 6.6 % (ref 0.0–14.0)
NEUT#: 4.9 10*3/uL (ref 1.5–6.5)
NEUT%: 70.9 % (ref 39.0–75.0)
Platelets: 218 10*3/uL (ref 140–400)
RBC: 5.37 10*6/uL (ref 4.20–5.82)
RDW: 14.3 % (ref 11.0–14.6)
WBC: 6.9 10*3/uL (ref 4.0–10.3)
lymph#: 1.4 10*3/uL (ref 0.9–3.3)

## 2012-02-20 NOTE — Telephone Encounter (Signed)
Per staff message and POF I have scheduled appt.  JMW  

## 2012-02-20 NOTE — Progress Notes (Signed)
Phlebotomy performed via right antecubital vein. 500 cc removed without difficulty. Patient ate snack before procedure, tolerated procedure well.

## 2012-02-20 NOTE — Patient Instructions (Addendum)
Follow up in 1 month with repeat labs and possible phlebotomy

## 2012-02-20 NOTE — Patient Instructions (Addendum)

## 2012-02-20 NOTE — Telephone Encounter (Signed)
gv and printed appt schedule to pt for Jan 2014 °

## 2012-02-24 NOTE — Progress Notes (Signed)
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  PRIOR THERAPY: None   CURRENT THERAPY: Phlebotomy on an as-needed basis noted to keep the hematocrit at 45% or less   INTERVAL HISTORY: James [name] 51 y.o. male returns to the clinic today for a scheduled followup visit. The patient is feeling fine today with no specific complaints. He denied having any significant weight loss or night sweats. He denied having any chest pain, shortness breath, cough or hemoptysis. He has no bleeding issues or palpable lymphadenopathy. He has repeat CBC performed earlier today and he is here for evaluation and discussion of his lab results. He still on treatment with testosterone gel.  MEDICAL HISTORY:History reviewed. No pertinent past medical history.  ALLERGIES:   has no known allergies.  MEDICATIONS:  Current Outpatient Prescriptions  Medication Sig Dispense Refill  . Aspirin-Acetaminophen-Caffeine (GOODY HEADACHE PO) Take by mouth as needed.      . imiquimod (ALDARA) 5 % cream       . TESTIM 50 MG/5GM GEL       . valACYclovir (VALTREX) 500 MG tablet         REVIEW OF SYSTEMS:  A comprehensive review of systems was negative.   PHYSICAL EXAMINATION: General appearance: alert, cooperative and no distress Head: Normocephalic, without obvious abnormality, atraumatic Neck: no adenopathy Lymph nodes: Cervical, supraclavicular, and axillary nodes normal. Resp: clear to auscultation bilaterally Cardio: regular rate and rhythm, S1, S2 normal, no murmur, click, rub or gallop GI: soft, non-tender; bowel sounds normal; no masses,  no organomegaly Extremities: extremities normal, atraumatic, no cyanosis or edema  ECOG PERFORMANCE STATUS: 0 - Asymptomatic  Blood pressure 122/82, pulse 84, temperature 98.1 F (36.7 C), temperature source Oral, resp. rate  18, height 5\' 9"  (1.753 m), weight 201 lb 11.2 oz (91.491 kg).  LABORATORY DATA: Lab Results  Component Value Date   WBC 6.9 02/20/2012   HGB 16.8 02/20/2012   HCT 49.3 02/20/2012   MCV 91.8 02/20/2012   PLT 218 02/20/2012      Chemistry      Component Value Date/Time   NA 137 01/09/2012 1321   NA 138 08/22/2011 0923   K 4.5 01/09/2012 1321   K 4.1 08/22/2011 0923   CL 107 01/09/2012 1321   CL 107 08/22/2011 0923   CO2 24 01/09/2012 1321   CO2 23 08/22/2011 0923   BUN 23.0 01/09/2012 1321   BUN 22 08/22/2011 0923   CREATININE 1.0 01/09/2012 1321   CREATININE 1.08 08/22/2011 0923      Component Value Date/Time   CALCIUM 9.3 01/09/2012 1321   CALCIUM 9.1 08/22/2011 0923   ALKPHOS 60 01/09/2012 1321   ALKPHOS 51 08/22/2011 0923   AST 25 01/09/2012 1321   AST 27 08/22/2011 0923   ALT 35 01/09/2012 1321   ALT 33 08/22/2011 0923   BILITOT 0.39 01/09/2012 1321   BILITOT 0.5 08/22/2011 0923       RADIOGRAPHIC STUDIES: No results found.  ASSESSMENT/PLAN: This is a very pleasant 51 years old white male with reactive polycythemia most likely secondary to his hormonal treatment with testosterone gel. His hematocrit is elevated today at 49.3%. He will require phlebotomy today. The patient was discussed with Dr. Arbutus Ped. He will proceed with phlebotomy of one unit today and return in one month with repeat CBC differential and a platelet for possible further phlebotomy.  Laural Benes, Yaman Grauberger E, PA-C   All questions were answered. The patient knows to call the clinic with any problems, questions or concerns. We can certainly see the patient much sooner if necessary.

## 2012-03-24 ENCOUNTER — Ambulatory Visit (HOSPITAL_BASED_OUTPATIENT_CLINIC_OR_DEPARTMENT_OTHER): Payer: BC Managed Care – PPO | Admitting: Physician Assistant

## 2012-03-24 ENCOUNTER — Telehealth: Payer: Self-pay | Admitting: Internal Medicine

## 2012-03-24 ENCOUNTER — Other Ambulatory Visit (HOSPITAL_BASED_OUTPATIENT_CLINIC_OR_DEPARTMENT_OTHER): Payer: BC Managed Care – PPO

## 2012-03-24 ENCOUNTER — Telehealth: Payer: Self-pay | Admitting: *Deleted

## 2012-03-24 ENCOUNTER — Ambulatory Visit (HOSPITAL_BASED_OUTPATIENT_CLINIC_OR_DEPARTMENT_OTHER): Payer: BC Managed Care – PPO

## 2012-03-24 VITALS — BP 140/98 | HR 81 | Resp 20

## 2012-03-24 VITALS — BP 128/83 | HR 82 | Temp 98.7°F | Resp 20 | Ht 69.0 in | Wt 200.2 lb

## 2012-03-24 DIAGNOSIS — D751 Secondary polycythemia: Secondary | ICD-10-CM

## 2012-03-24 DIAGNOSIS — D45 Polycythemia vera: Secondary | ICD-10-CM

## 2012-03-24 LAB — CBC WITH DIFFERENTIAL/PLATELET
BASO%: 0.3 % (ref 0.0–2.0)
Basophils Absolute: 0 10*3/uL (ref 0.0–0.1)
EOS%: 1.8 % (ref 0.0–7.0)
Eosinophils Absolute: 0.1 10*3/uL (ref 0.0–0.5)
HCT: 48.9 % (ref 38.4–49.9)
HGB: 16.9 g/dL (ref 13.0–17.1)
LYMPH%: 20.8 % (ref 14.0–49.0)
MCH: 30.9 pg (ref 27.2–33.4)
MCHC: 34.6 g/dL (ref 32.0–36.0)
MCV: 89.4 fL (ref 79.3–98.0)
MONO#: 0.4 10*3/uL (ref 0.1–0.9)
MONO%: 5.9 % (ref 0.0–14.0)
NEUT#: 4.5 10*3/uL (ref 1.5–6.5)
NEUT%: 71.2 % (ref 39.0–75.0)
Platelets: 205 10*3/uL (ref 140–400)
RBC: 5.47 10*6/uL (ref 4.20–5.82)
RDW: 13.6 % (ref 11.0–14.6)
WBC: 6.3 10*3/uL (ref 4.0–10.3)
lymph#: 1.3 10*3/uL (ref 0.9–3.3)
nRBC: 0 % (ref 0–0)

## 2012-03-24 NOTE — Patient Instructions (Addendum)
Therapeutic Phlebotomy Therapeutic phlebotomy is the controlled removal of blood from your body for the purpose of treating a medical condition. It is similar to donating blood. Usually, about a pint (470 mL) of blood is removed. The average adult has 9 to 12 pints (4.3 to 5.7 L) of blood. Therapeutic phlebotomy may be used to treat the following medical conditions:  Hemochromatosis. This is a condition in which there is too much iron in the blood.  Polycythemia vera. This is a condition in which there are too many red cells in the blood.  Porphyria cutanea tarda. This is a disease usually passed from one generation to the next (inherited). It is a condition in which an important part of hemoglobin is not made properly. This results in the build up of abnormal amounts of porphyrins in the body.  Sickle cell disease. This is an inherited disease. It is a condition in which the red blood cells form an abnormal crescent shape rather than a round shape. LET YOUR CAREGIVER KNOW ABOUT:  Allergies.  Medicines taken including herbs, eyedrops, over-the-counter medicines, and creams.  Use of steroids (by mouth or creams).  Previous problems with anesthetics or numbing medicine.  History of blood clots.  History of bleeding or blood problems.  Previous surgery.  Possibility of pregnancy, if this applies. RISKS AND COMPLICATIONS This is a simple and safe procedure. Problems are unlikely. However, problems can occur and may include:  Nausea or lightheadedness.  Low blood pressure.  Soreness, bleeding, swelling, or bruising at the needle insertion site.  Infection. BEFORE THE PROCEDURE  This is a procedure that can be done as an outpatient. Confirm the time that you need to arrive for your procedure. Confirm whether there is a need to fast or withhold any medications. It is helpful to wear clothing with sleeves that can be raised above the elbow. A blood sample may be done to determine the  amount of red blood cells or iron in your blood. Plan ahead of time to have someone drive you home after the procedure. PROCEDURE The entire procedure from preparation through recovery takes about 1 hour. The actual collection takes about 10 to 15 minutes.  A needle will be inserted into your vein.  Tubing and a collection bag will be attached to that needle.  Blood will flow through the needle and tubing into the collection bag.  You may be asked to open and close your hand slowly and continuously during the entire collection.  Once the specified amount of blood has been removed from your body, the collection bag and tubing will be clamped.  The needle will be removed.  Pressure will be held on the site of the needle insertion to stop the bleeding. Then a bandage will be placed over the needle insertion site. AFTER THE PROCEDURE  Your recovery will be assessed and monitored. If there are no problems, as an outpatient, you should be able to go home shortly after the procedure.  Document Released: 07/31/2010 Document Revised: 05/21/2011 Document Reviewed: 07/31/2010 [employer] Patient Information 2013 [employer].  

## 2012-03-24 NOTE — Patient Instructions (Addendum)
Continue with monthly phlebotomy if indicated by your lab results Follow up in 3 months

## 2012-03-24 NOTE — Telephone Encounter (Signed)
Per staff message and POF I have scheduled appts.  JMW  

## 2012-03-24 NOTE — Progress Notes (Signed)
1 unit of blood drawn from right antecubital. Pt tolerated well. Snack given to pt with discharge instructions. Pt tolerated procedure well.

## 2012-03-24 NOTE — Telephone Encounter (Signed)
appts made and printed for pt aom °

## 2012-03-26 NOTE — Progress Notes (Signed)
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  PRIOR THERAPY: None   CURRENT THERAPY: Phlebotomy on an as-needed basis noted to keep the hematocrit at 45% or less   INTERVAL HISTORY: James [name] 52 y.o. male returns to the clinic today for a scheduled followup visit. The patient is feeling fine today with no specific complaints. He denied having any significant weight loss or night sweats. He denied having any chest pain, shortness breath, cough or hemoptysis. He has no bleeding issues or palpable lymphadenopathy. He had a repeat CBC performed earlier today and he is here for evaluation and discussion of his lab results. He still on treatment with testosterone gel.  MEDICAL HISTORY:No past medical history on file.  ALLERGIES:   has no known allergies.  MEDICATIONS:  Current Outpatient Prescriptions  Medication Sig Dispense Refill  . Aspirin-Acetaminophen-Caffeine (GOODY HEADACHE PO) Take by mouth as needed.      . TESTIM 50 MG/5GM GEL       . valACYclovir (VALTREX) 500 MG tablet         REVIEW OF SYSTEMS:  A comprehensive review of systems was negative.   PHYSICAL EXAMINATION: General appearance: alert, cooperative and no distress Head: Normocephalic, without obvious abnormality, atraumatic Neck: no adenopathy Lymph nodes: Cervical, supraclavicular, and axillary nodes normal. Resp: clear to auscultation bilaterally Cardio: regular rate and rhythm, S1, S2 normal, no murmur, click, rub or gallop GI: soft, non-tender; bowel sounds normal; no masses,  no organomegaly Extremities: extremities normal, atraumatic, no cyanosis or edema  ECOG PERFORMANCE STATUS: 0 - Asymptomatic  Blood pressure 128/83, pulse 82, temperature 98.7 F (37.1 C), temperature source Oral, resp. rate 20, height 5\' 9"  (1.753 m), weight 200 lb 3.2 oz (90.81  kg).  LABORATORY DATA: Lab Results  Component Value Date   WBC 6.3 03/24/2012   HGB 16.9 03/24/2012   HCT 48.9 03/24/2012   MCV 89.4 03/24/2012   PLT 205 03/24/2012      Chemistry      Component Value Date/Time   NA 137 01/09/2012 1321   NA 138 08/22/2011 0923   K 4.5 01/09/2012 1321   K 4.1 08/22/2011 0923   CL 107 01/09/2012 1321   CL 107 08/22/2011 0923   CO2 24 01/09/2012 1321   CO2 23 08/22/2011 0923   BUN 23.0 01/09/2012 1321   BUN 22 08/22/2011 0923   CREATININE 1.0 01/09/2012 1321   CREATININE 1.08 08/22/2011 0923      Component Value Date/Time   CALCIUM 9.3 01/09/2012 1321   CALCIUM 9.1 08/22/2011 0923   ALKPHOS 60 01/09/2012 1321   ALKPHOS 51 08/22/2011 0923   AST 25 01/09/2012 1321   AST 27 08/22/2011 0923   ALT 35 01/09/2012 1321   ALT 33 08/22/2011 0923   BILITOT 0.39 01/09/2012 1321   BILITOT 0.5 08/22/2011 0923       RADIOGRAPHIC STUDIES: No results found.  ASSESSMENT/PLAN: This is a very pleasant 52 years old white male with reactive polycythemia most likely secondary to his hormonal treatment with testosterone gel. His hematocrit is elevated today at 48.9%. He will require phlebotomy today. The patient was discussed with Dr. Arbutus Ped. He will proceed with phlebotomy of one unit today. He will return monthly for a CBC Differential and possible phlebotomy and will follow up in 3 months for another symptom management visit with a repeat CBC Differential  and possible phlebotomy.  James [name], James Raburn E, PA-C   All questions were answered. The patient knows to call the clinic with any problems, questions or concerns. We can certainly see the patient much sooner if necessary.

## 2012-04-15 ENCOUNTER — Telehealth: Payer: Self-pay | Admitting: *Deleted

## 2012-04-15 NOTE — Telephone Encounter (Signed)
Progress note from Dr Kinnie Scales 04/15/12 given to Dr Donnald Garre to review.  SLJ

## 2012-04-21 ENCOUNTER — Other Ambulatory Visit (HOSPITAL_BASED_OUTPATIENT_CLINIC_OR_DEPARTMENT_OTHER): Payer: BC Managed Care – PPO | Admitting: Lab

## 2012-04-21 ENCOUNTER — Ambulatory Visit (HOSPITAL_BASED_OUTPATIENT_CLINIC_OR_DEPARTMENT_OTHER): Payer: BC Managed Care – PPO

## 2012-04-21 DIAGNOSIS — D45 Polycythemia vera: Secondary | ICD-10-CM

## 2012-04-21 DIAGNOSIS — D751 Secondary polycythemia: Secondary | ICD-10-CM

## 2012-04-21 LAB — CBC WITH DIFFERENTIAL/PLATELET
BASO%: 0.1 % (ref 0.0–2.0)
Basophils Absolute: 0 10*3/uL (ref 0.0–0.1)
EOS%: 1.8 % (ref 0.0–7.0)
Eosinophils Absolute: 0.1 10*3/uL (ref 0.0–0.5)
HCT: 49.2 % (ref 38.4–49.9)
HGB: 16.8 g/dL (ref 13.0–17.1)
LYMPH%: 18.7 % (ref 14.0–49.0)
MCH: 30.8 pg (ref 27.2–33.4)
MCHC: 34.1 g/dL (ref 32.0–36.0)
MCV: 90.3 fL (ref 79.3–98.0)
MONO#: 0.7 10*3/uL (ref 0.1–0.9)
MONO%: 8.2 % (ref 0.0–14.0)
NEUT#: 5.7 10*3/uL (ref 1.5–6.5)
NEUT%: 71.2 % (ref 39.0–75.0)
Platelets: 199 10*3/uL (ref 140–400)
RBC: 5.45 10*6/uL (ref 4.20–5.82)
RDW: 13.6 % (ref 11.0–14.6)
WBC: 7.9 10*3/uL (ref 4.0–10.3)
lymph#: 1.5 10*3/uL (ref 0.9–3.3)
nRBC: 0 % (ref 0–0)

## 2012-04-21 NOTE — Patient Instructions (Addendum)
Therapeutic Phlebotomy Therapeutic phlebotomy is the controlled removal of blood from your body for the purpose of treating a medical condition. It is similar to donating blood. Usually, about a pint (470 mL) of blood is removed. The average adult has 9 to 12 pints (4.3 to 5.7 L) of blood. Therapeutic phlebotomy may be used to treat the following medical conditions:  Hemochromatosis. This is a condition in which there is too much iron in the blood.  Polycythemia vera. This is a condition in which there are too many red cells in the blood.  Porphyria cutanea tarda. This is a disease usually passed from one generation to the next (inherited). It is a condition in which an important part of hemoglobin is not made properly. This results in the build up of abnormal amounts of porphyrins in the body.  Sickle cell disease. This is an inherited disease. It is a condition in which the red blood cells form an abnormal crescent shape rather than a round shape. LET YOUR CAREGIVER KNOW ABOUT:  Allergies.  Medicines taken including herbs, eyedrops, over-the-counter medicines, and creams.  Use of steroids (by mouth or creams).  Previous problems with anesthetics or numbing medicine.  History of blood clots.  History of bleeding or blood problems.  Previous surgery.  Possibility of pregnancy, if this applies. RISKS AND COMPLICATIONS This is a simple and safe procedure. Problems are unlikely. However, problems can occur and may include:  Nausea or lightheadedness.  Low blood pressure.  Soreness, bleeding, swelling, or bruising at the needle insertion site.  Infection. BEFORE THE PROCEDURE  This is a procedure that can be done as an outpatient. Confirm the time that you need to arrive for your procedure. Confirm whether there is a need to fast or withhold any medications. It is helpful to wear clothing with sleeves that can be raised above the elbow. A blood sample may be done to determine the  amount of red blood cells or iron in your blood. Plan ahead of time to have someone drive you home after the procedure. PROCEDURE The entire procedure from preparation through recovery takes about 1 hour. The actual collection takes about 10 to 15 minutes.  A needle will be inserted into your vein.  Tubing and a collection bag will be attached to that needle.  Blood will flow through the needle and tubing into the collection bag.  You may be asked to open and close your hand slowly and continuously during the entire collection.  Once the specified amount of blood has been removed from your body, the collection bag and tubing will be clamped.  The needle will be removed.  Pressure will be held on the site of the needle insertion to stop the bleeding. Then a bandage will be placed over the needle insertion site. AFTER THE PROCEDURE  Your recovery will be assessed and monitored. If there are no problems, as an outpatient, you should be able to go home shortly after the procedure.  Document Released: 07/31/2010 Document Revised: 05/21/2011 Document Reviewed: 07/31/2010 [employer] Patient Information 2013 [employer].  

## 2012-04-21 NOTE — Progress Notes (Signed)
Phlebotomy started at 1445, 504g obtained, ended at 1455.  Pt tolerated well, eating snacks after.  No complaints voiced. Pt left ambulatory @ 1530.   dmr

## 2012-04-22 ENCOUNTER — Telehealth: Payer: Self-pay | Admitting: Medical Oncology

## 2012-04-22 NOTE — Telephone Encounter (Signed)
Getting treatment for hep c with zavalda / ribavirin. Wants to see Dr Arbutus Ped on march 7th to discus the hep c treatment and phlebotomies

## 2012-04-24 ENCOUNTER — Telehealth: Payer: Self-pay | Admitting: Internal Medicine

## 2012-04-24 NOTE — Telephone Encounter (Signed)
lmonvm for pt re appt for 2/18

## 2012-04-29 ENCOUNTER — Encounter: Payer: Self-pay | Admitting: Internal Medicine

## 2012-04-29 ENCOUNTER — Ambulatory Visit (HOSPITAL_BASED_OUTPATIENT_CLINIC_OR_DEPARTMENT_OTHER): Payer: BC Managed Care – PPO | Admitting: Internal Medicine

## 2012-04-29 VITALS — BP 124/81 | HR 95 | Temp 98.0°F | Resp 20 | Ht 69.0 in | Wt 201.6 lb

## 2012-04-29 DIAGNOSIS — B1920 Unspecified viral hepatitis C without hepatic coma: Secondary | ICD-10-CM

## 2012-04-29 DIAGNOSIS — D751 Secondary polycythemia: Secondary | ICD-10-CM

## 2012-04-29 DIAGNOSIS — D45 Polycythemia vera: Secondary | ICD-10-CM

## 2012-04-29 HISTORY — DX: Unspecified viral hepatitis C without hepatic coma: B19.20

## 2012-04-29 NOTE — Progress Notes (Signed)
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   PRIOR THERAPY: None   CURRENT THERAPY: Phlebotomy on an as-needed basis noted to keep the hematocrit at 45% or less  INTERVAL HISTORY: James [name] 52 y.o. male returns to the clinic today for followup visit. The patient is feeling fine today with no specific complaints. He denied having any significant weight loss or night sweats. He denied having any headache or blurry vision. Has no chest pain, shortness breath, cough or hemoptysis. He had a phlebotomy performed last week. He has repeat CBC performed earlier today and he is here for evaluation and discussion of his lab results. The patient expected to start treatment for hepatitis C at [facility] in April 2014.    ALLERGIES:  has No Known Allergies.  MEDICATIONS:  Current Outpatient Prescriptions  Medication Sig Dispense Refill  . Aspirin-Acetaminophen-Caffeine (GOODY HEADACHE PO) Take by mouth as needed.      . TESTIM 50 MG/5GM GEL       . valACYclovir (VALTREX) 500 MG tablet        No current facility-administered medications for this visit.   REVIEW OF SYSTEMS:  A comprehensive review of systems was negative.   PHYSICAL EXAMINATION: General appearance: alert, cooperative and no distress Head: Normocephalic, without obvious abnormality, atraumatic Neck: no adenopathy Lymph nodes: Cervical, supraclavicular, and axillary nodes normal. Resp: clear to auscultation bilaterally Cardio: regular rate and rhythm, S1, S2 normal, no murmur, click, rub or gallop GI: soft, non-tender; bowel sounds normal; no masses,  no organomegaly Extremities: extremities normal, atraumatic, no cyanosis or edema  ECOG PERFORMANCE STATUS: 0 - Asymptomatic  Blood pressure 124/81, pulse 95, temperature 98 F (36.7 C), temperature source Oral, resp.  rate 20, height 5\' 9"  (1.753 m), weight 201 lb 9.6 oz (91.445 kg).  LABORATORY DATA: Lab Results  Component Value Date   WBC 7.9 04/21/2012   HGB 16.8 04/21/2012   HCT 49.2 04/21/2012   MCV 90.3 04/21/2012   PLT 199 04/21/2012      Chemistry      Component Value Date/Time   NA 137 01/09/2012 1321   NA 138 08/22/2011 0923   K 4.5 01/09/2012 1321   K 4.1 08/22/2011 0923   CL 107 01/09/2012 1321   CL 107 08/22/2011 0923   CO2 24 01/09/2012 1321   CO2 23 08/22/2011 0923   BUN 23.0 01/09/2012 1321   BUN 22 08/22/2011 0923   CREATININE 1.0 01/09/2012 1321   CREATININE 1.08 08/22/2011 0923      Component Value Date/Time   CALCIUM 9.3 01/09/2012 1321   CALCIUM 9.1 08/22/2011 0923   ALKPHOS 60 01/09/2012 1321   ALKPHOS 51 08/22/2011 0923   AST 25 01/09/2012 1321   AST 27 08/22/2011 0923   ALT 35 01/09/2012 1321   ALT 33 08/22/2011 0923   BILITOT 0.39 01/09/2012 1321   BILITOT 0.5 08/22/2011 0923       RADIOGRAPHIC STUDIES: No results found.  ASSESSMENT: This is a very pleasant 51 years old white male with history of reactive polycythemia secondary to hormonal treatment with testesterone. His hemoglobin and hematocrit are within the acceptable range today.  PLAN: I recommend for the patient to continue on observation with repeat CBC and comprehensive metabolic panel in 3 months. His treatment for hepatitis C may lower his hemoglobin and hematocrit as a side effect.  We'll continue to monitor this closely. He was advised to call immediately if he has any concerning symptoms in the interval. All questions were answered. The patient knows to call the clinic with any problems, questions or concerns. We can certainly see the patient much sooner if necessary.

## 2012-04-29 NOTE — Patient Instructions (Signed)
Your hemoglobin and hematocrit are stable today. Followup in 3 months with repeat CBC and comprehensive metabolic panel.

## 2012-04-30 ENCOUNTER — Telehealth: Payer: Self-pay | Admitting: Medical Oncology

## 2012-04-30 NOTE — Telephone Encounter (Signed)
I called pt and left message to pick up his checkbook that was found in exam room 19.

## 2012-04-30 NOTE — Telephone Encounter (Signed)
Pt picked up checkbook this am.

## 2012-05-02 ENCOUNTER — Telehealth: Payer: Self-pay | Admitting: Internal Medicine

## 2012-05-16 ENCOUNTER — Ambulatory Visit: Payer: BC Managed Care – PPO | Admitting: Physician Assistant

## 2012-05-16 ENCOUNTER — Other Ambulatory Visit: Payer: BC Managed Care – PPO | Admitting: Lab

## 2012-07-29 ENCOUNTER — Other Ambulatory Visit: Payer: Self-pay | Admitting: *Deleted

## 2012-07-29 ENCOUNTER — Other Ambulatory Visit: Payer: BC Managed Care – PPO

## 2012-07-29 ENCOUNTER — Ambulatory Visit: Payer: BC Managed Care – PPO | Admitting: Internal Medicine

## 2012-07-29 ENCOUNTER — Telehealth: Payer: Self-pay | Admitting: Internal Medicine

## 2012-07-29 NOTE — Telephone Encounter (Signed)
s.w. pt and r/s missed appt.....pt ok and aware °

## 2012-08-06 ENCOUNTER — Encounter: Payer: Self-pay | Admitting: Internal Medicine

## 2012-08-06 ENCOUNTER — Ambulatory Visit (HOSPITAL_BASED_OUTPATIENT_CLINIC_OR_DEPARTMENT_OTHER): Payer: BC Managed Care – PPO | Admitting: Internal Medicine

## 2012-08-06 ENCOUNTER — Telehealth: Payer: Self-pay | Admitting: Internal Medicine

## 2012-08-06 ENCOUNTER — Other Ambulatory Visit (HOSPITAL_BASED_OUTPATIENT_CLINIC_OR_DEPARTMENT_OTHER): Payer: BC Managed Care – PPO | Admitting: Lab

## 2012-08-06 ENCOUNTER — Other Ambulatory Visit: Payer: Self-pay | Admitting: Medical Oncology

## 2012-08-06 VITALS — BP 138/84 | HR 86 | Temp 97.8°F | Resp 18 | Ht 69.0 in | Wt 198.9 lb

## 2012-08-06 DIAGNOSIS — D751 Secondary polycythemia: Secondary | ICD-10-CM

## 2012-08-06 DIAGNOSIS — D45 Polycythemia vera: Secondary | ICD-10-CM

## 2012-08-06 LAB — CBC WITH DIFFERENTIAL/PLATELET
BASO%: 0.4 % (ref 0.0–2.0)
Basophils Absolute: 0 10*3/uL (ref 0.0–0.1)
EOS%: 1.7 % (ref 0.0–7.0)
Eosinophils Absolute: 0.1 10*3/uL (ref 0.0–0.5)
HCT: 38.4 % (ref 38.4–49.9)
HGB: 12.8 g/dL — ABNORMAL LOW (ref 13.0–17.1)
LYMPH%: 18.6 % (ref 14.0–49.0)
MCH: 31.8 pg (ref 27.2–33.4)
MCHC: 33.3 g/dL (ref 32.0–36.0)
MCV: 95.5 fL (ref 79.3–98.0)
MONO#: 0.4 10*3/uL (ref 0.1–0.9)
MONO%: 6.9 % (ref 0.0–14.0)
NEUT#: 4.7 10*3/uL (ref 1.5–6.5)
NEUT%: 72.4 % (ref 39.0–75.0)
Platelets: 310 10*3/uL (ref 140–400)
RBC: 4.02 10*6/uL — ABNORMAL LOW (ref 4.20–5.82)
RDW: 18.8 % — ABNORMAL HIGH (ref 11.0–14.6)
WBC: 6.5 10*3/uL (ref 4.0–10.3)
lymph#: 1.2 10*3/uL (ref 0.9–3.3)

## 2012-08-06 LAB — COMPREHENSIVE METABOLIC PANEL (CC13)
ALT: 41 U/L (ref 0–55)
AST: 26 U/L (ref 5–34)
Albumin: 3.6 g/dL (ref 3.5–5.0)
Alkaline Phosphatase: 59 U/L (ref 40–150)
BUN: 25.6 mg/dL (ref 7.0–26.0)
CO2: 24 mEq/L (ref 22–29)
Calcium: 8.7 mg/dL (ref 8.4–10.4)
Chloride: 106 mEq/L (ref 98–107)
Creatinine: 1 mg/dL (ref 0.7–1.3)
Glucose: 111 mg/dl — ABNORMAL HIGH (ref 70–99)
Potassium: 4 mEq/L (ref 3.5–5.1)
Sodium: 138 mEq/L (ref 136–145)
Total Bilirubin: 0.74 mg/dL (ref 0.20–1.20)
Total Protein: 6.6 g/dL (ref 6.4–8.3)

## 2012-08-06 NOTE — Progress Notes (Signed)
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   PRIOR THERAPY: None   CURRENT THERAPY: Phlebotomy on an as-needed basis noted to keep the hematocrit at 45% or less  INTERVAL HISTORY: James [name] 53 y.o. male returns to the clinic today for his pain the patient is feeling fine today with no specific complaints. He is currently on treatment with Ribavirin for hepatitis C and tolerating it fairly well. He denied having any significant fatigue or weakness. He denied having any weight loss or night sweats. He has no chest pain, shortness breath, cough or hemoptysis. The patient has repeat CBC performed earlier today and he is here for evaluation and recommendation regarding his condition.  MEDICAL HISTORY: Past Medical History  Diagnosis Date  . Hepatitis C 04/29/2012    ALLERGIES:  has No Known Allergies.  MEDICATIONS:  Current Outpatient Prescriptions  Medication Sig Dispense Refill  . Aspirin-Acetaminophen-Caffeine (GOODY HEADACHE PO) Take by mouth as needed.      . TESTIM 50 MG/5GM GEL       . valACYclovir (VALTREX) 500 MG tablet       . ribavirin (REBETOL) 200 MG capsule Take 200 mg by mouth daily.      [name] SOVALDI 400 MG TABS Take 400 mg by mouth daily.       No current facility-administered medications for this visit.    REVIEW OF SYSTEMS:  A comprehensive review of systems was negative.   PHYSICAL EXAMINATION: General appearance: alert, cooperative and no distress Head: Normocephalic, without obvious abnormality, atraumatic Neck: no adenopathy Lymph nodes: Cervical, supraclavicular, and axillary nodes normal. Resp: clear to auscultation bilaterally Cardio: regular rate and rhythm, S1, S2 normal, no murmur, click, rub or gallop GI: soft, non-tender; bowel sounds  normal; no masses,  no organomegaly Extremities: extremities normal, atraumatic, no cyanosis or edema  ECOG PERFORMANCE STATUS: 0 - Asymptomatic  Blood pressure 138/84, pulse 86, temperature 97.8 F (36.6 C), temperature source Oral, resp. rate 18, height 5\' 9"  (1.753 m), weight 198 lb 14.4 oz (90.22 kg).  LABORATORY DATA: Lab Results  Component Value Date   WBC 6.5 08/06/2012   HGB 12.8* 08/06/2012   HCT 38.4 08/06/2012   MCV 95.5 08/06/2012   PLT 310 08/06/2012      Chemistry      Component Value Date/Time   NA 137 01/09/2012 1321   NA 138 08/22/2011 0923   K 4.5 01/09/2012 1321   K 4.1 08/22/2011 0923   CL 107 01/09/2012 1321   CL 107 08/22/2011 0923   CO2 24 01/09/2012 1321   CO2 23 08/22/2011 0923   BUN 23.0 01/09/2012 1321   BUN 22 08/22/2011 0923   CREATININE 1.0 01/09/2012 1321   CREATININE 1.08 08/22/2011 0923      Component Value Date/Time   CALCIUM 9.3 01/09/2012 1321   CALCIUM 9.1 08/22/2011 0923   ALKPHOS 60 01/09/2012 1321   ALKPHOS 51 08/22/2011 0923   AST 25 01/09/2012 1321   AST 27 08/22/2011 0923   ALT 35 01/09/2012 1321   ALT 33 08/22/2011 0923   BILITOT 0.39 01/09/2012 1321   BILITOT 0.5 08/22/2011 0923       RADIOGRAPHIC STUDIES: No results found.  ASSESSMENT: This is a very pleasant 52 years old white male with reactive polycythemia secondary to  hormonal treatment.   PLAN: The patient is doing fine today in his hemoglobin and hematocrit are within the acceptable range. I recommended for the patient to continue on observation for now with repeat CBC, comprehensive metabolic panel and LDH in 3 months. I will arrange for the patient to have phlebotomy at that time if his hematocrit over 45%. He was advised to call immediately if he has any concerning symptoms in the interval.  All questions were answered. The patient knows to call the clinic with any problems, questions or concerns. We can certainly see the patient much sooner if necessary.

## 2012-08-06 NOTE — Telephone Encounter (Signed)
Gave pt appt for August 2014 lab and md

## 2012-08-06 NOTE — Patient Instructions (Signed)
Continue observation for now. Followup visit in 3 months

## 2012-08-07 ENCOUNTER — Telehealth: Payer: Self-pay | Admitting: *Deleted

## 2012-08-07 NOTE — Telephone Encounter (Signed)
Per staff message and POF I have scheduled appts.  JMW  

## 2012-11-05 ENCOUNTER — Telehealth: Payer: Self-pay | Admitting: *Deleted

## 2012-11-05 ENCOUNTER — Other Ambulatory Visit: Payer: Self-pay | Admitting: *Deleted

## 2012-11-05 ENCOUNTER — Telehealth: Payer: Self-pay | Admitting: Internal Medicine

## 2012-11-05 NOTE — Telephone Encounter (Signed)
Walk in form received at 1515 that patient is in lobby with questions. Request  A stress test or referral to cardiologist.  Reports he is having a heart flutter/palpitations in the mid upper chest that have been worse the last two weeks.  Noted "fluttering all the time when on Hepatitis C medications".  Was told palpitations would eventually stop when this medicine was stopped.  The past two weeks he has noted palpitations at night before he lies down and every morning upon waking up.  Today he did feel a little light headed is why he came in.  Denies any diaphoresis or other symptoms.  Requested stress test and or a referral to a cardiologist if Dr. Arbutus Ped is unable to help.  Advised to go to ER but he does not want to do this.  [facility] handouts for Palpitions and Holter monitoring given to patient at this time.  Tomorrow's appointments will be cancelled due to provider out of office this week.  Patient did not want to wait any longer and asked for a scheduler to call him with new appointments.  Can be reached at [phone].

## 2012-11-05 NOTE — Telephone Encounter (Signed)
Talked to pt and he is aware of new appt time for 11/06/12 lab,ML and Phlebotomy, ok with Seward Grater

## 2012-11-06 ENCOUNTER — Ambulatory Visit (HOSPITAL_BASED_OUTPATIENT_CLINIC_OR_DEPARTMENT_OTHER): Payer: BC Managed Care – PPO | Admitting: Physician Assistant

## 2012-11-06 ENCOUNTER — Telehealth: Payer: Self-pay | Admitting: *Deleted

## 2012-11-06 ENCOUNTER — Other Ambulatory Visit: Payer: BC Managed Care – PPO | Admitting: Lab

## 2012-11-06 ENCOUNTER — Ambulatory Visit (HOSPITAL_BASED_OUTPATIENT_CLINIC_OR_DEPARTMENT_OTHER): Payer: BC Managed Care – PPO

## 2012-11-06 ENCOUNTER — Telehealth: Payer: Self-pay | Admitting: Internal Medicine

## 2012-11-06 ENCOUNTER — Ambulatory Visit: Payer: BC Managed Care – PPO | Admitting: Internal Medicine

## 2012-11-06 ENCOUNTER — Other Ambulatory Visit (HOSPITAL_BASED_OUTPATIENT_CLINIC_OR_DEPARTMENT_OTHER): Payer: BC Managed Care – PPO | Admitting: Lab

## 2012-11-06 VITALS — BP 142/86 | HR 84 | Temp 98.3°F | Resp 19

## 2012-11-06 VITALS — BP 140/84 | HR 80 | Temp 98.4°F | Resp 18 | Ht 69.0 in | Wt 198.0 lb

## 2012-11-06 DIAGNOSIS — D45 Polycythemia vera: Secondary | ICD-10-CM

## 2012-11-06 DIAGNOSIS — D751 Secondary polycythemia: Secondary | ICD-10-CM

## 2012-11-06 LAB — COMPREHENSIVE METABOLIC PANEL (CC13)
ALT: 23 U/L (ref 0–55)
AST: 20 U/L (ref 5–34)
Albumin: 3.5 g/dL (ref 3.5–5.0)
Alkaline Phosphatase: 58 U/L (ref 40–150)
BUN: 22.4 mg/dL (ref 7.0–26.0)
CO2: 22 mEq/L (ref 22–29)
Calcium: 9.1 mg/dL (ref 8.4–10.4)
Chloride: 108 mEq/L (ref 98–109)
Creatinine: 1.1 mg/dL (ref 0.7–1.3)
Glucose: 140 mg/dl (ref 70–140)
Potassium: 4.1 mEq/L (ref 3.5–5.1)
Sodium: 138 mEq/L (ref 136–145)
Total Bilirubin: 0.23 mg/dL (ref 0.20–1.20)
Total Protein: 6.9 g/dL (ref 6.4–8.3)

## 2012-11-06 LAB — CBC WITH DIFFERENTIAL/PLATELET
BASO%: 0.1 % (ref 0.0–2.0)
Basophils Absolute: 0 10*3/uL (ref 0.0–0.1)
EOS%: 1.6 % (ref 0.0–7.0)
Eosinophils Absolute: 0.1 10*3/uL (ref 0.0–0.5)
HCT: 49.1 % (ref 38.4–49.9)
HGB: 16.8 g/dL (ref 13.0–17.1)
LYMPH%: 23 % (ref 14.0–49.0)
MCH: 31.2 pg (ref 27.2–33.4)
MCHC: 34.2 g/dL (ref 32.0–36.0)
MCV: 91.3 fL (ref 79.3–98.0)
MONO#: 0.5 10*3/uL (ref 0.1–0.9)
MONO%: 6.6 % (ref 0.0–14.0)
NEUT#: 4.6 10*3/uL (ref 1.5–6.5)
NEUT%: 68.7 % (ref 39.0–75.0)
Platelets: 215 10*3/uL (ref 140–400)
RBC: 5.38 10*6/uL (ref 4.20–5.82)
RDW: 12.9 % (ref 11.0–14.6)
WBC: 6.8 10*3/uL (ref 4.0–10.3)
lymph#: 1.6 10*3/uL (ref 0.9–3.3)

## 2012-11-06 LAB — LACTATE DEHYDROGENASE (CC13): LDH: 182 U/L (ref 125–245)

## 2012-11-06 NOTE — Telephone Encounter (Signed)
Per staff message and POF I have scheduled appts.  JMW  

## 2012-11-06 NOTE — Patient Instructions (Signed)
Therapeutic Phlebotomy Therapeutic phlebotomy is the controlled removal of blood from your body for the purpose of treating a medical condition. It is similar to donating blood. Usually, about a pint (470 mL) of blood is removed. The average adult has 9 to 12 pints (4.3 to 5.7 L) of blood. Therapeutic phlebotomy may be used to treat the following medical conditions:  Hemochromatosis. This is a condition in which there is too much iron in the blood.  Polycythemia vera. This is a condition in which there are too many red cells in the blood.  Porphyria cutanea tarda. This is a disease usually passed from one generation to the next (inherited). It is a condition in which an important part of hemoglobin is not made properly. This results in the build up of abnormal amounts of porphyrins in the body.  Sickle cell disease. This is an inherited disease. It is a condition in which the red blood cells form an abnormal crescent shape rather than a round shape. LET YOUR CAREGIVER KNOW ABOUT:  Allergies.  Medicines taken including herbs, eyedrops, over-the-counter medicines, and creams.  Use of steroids (by mouth or creams).  Previous problems with anesthetics or numbing medicine.  History of blood clots.  History of bleeding or blood problems.  Previous surgery.  Possibility of pregnancy, if this applies. RISKS AND COMPLICATIONS This is a simple and safe procedure. Problems are unlikely. However, problems can occur and may include:  Nausea or lightheadedness.  Low blood pressure.  Soreness, bleeding, swelling, or bruising at the needle insertion site.  Infection. BEFORE THE PROCEDURE  This is a procedure that can be done as an outpatient. Confirm the time that you need to arrive for your procedure. Confirm whether there is a need to fast or withhold any medications. It is helpful to wear clothing with sleeves that can be raised above the elbow. A blood sample may be done to determine the  amount of red blood cells or iron in your blood. Plan ahead of time to have someone drive you home after the procedure. PROCEDURE The entire procedure from preparation through recovery takes about 1 hour. The actual collection takes about 10 to 15 minutes.  A needle will be inserted into your vein.  Tubing and a collection bag will be attached to that needle.  Blood will flow through the needle and tubing into the collection bag.  You may be asked to open and close your hand slowly and continuously during the entire collection.  Once the specified amount of blood has been removed from your body, the collection bag and tubing will be clamped.  The needle will be removed.  Pressure will be held on the site of the needle insertion to stop the bleeding. Then a bandage will be placed over the needle insertion site. AFTER THE PROCEDURE  Your recovery will be assessed and monitored. If there are no problems, as an outpatient, you should be able to go home shortly after the procedure.  Document Released: 07/31/2010 Document Revised: 05/21/2011 Document Reviewed: 07/31/2010 [employer] Patient Information 2014 [employer].  

## 2012-11-06 NOTE — Telephone Encounter (Signed)
gv and printed appt sched and avs for pt...emailed MW to add tx... °

## 2012-11-06 NOTE — Progress Notes (Signed)
Hct @ 49.1, goal is for hct to be less than 45%. Phlebotomy started @ 1545 using phlebotomy kit and completed @ 1550 with 1 unit removed through right AC. Patient tolerated well. To stay 30 minutes for observation. Drink and nourishment provided.

## 2012-11-11 NOTE — Patient Instructions (Addendum)
Reduce your consumption of nicotine, 5 lower inner she drinks an coffee/espresso as well as discontinue smoking cigars. Followup in one month with possible call phlebotomy

## 2012-11-11 NOTE — Progress Notes (Signed)
[facility] [phone]   Fax:(336) [phone]  OFFICE PROGRESS NOTE  No PCP Per Patient [address] 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   PRIOR THERAPY: None   CURRENT THERAPY: Phlebotomy on an as-needed basis noted to keep the hematocrit at 45% or less  INTERVAL HISTORY: James [name] 52 y.o. male returns to the clinic today for his pain the patient is feeling fine today with no specific complaints. He is status post treatment with Ribavirin for hepatitis C and tolerating it fairly well. He denied having any significant fatigue or weakness. He denied having any weight loss or night sweats. He has no chest pain, shortness breath, cough or hemoptysis. He does report some episodes of his heart fluttering and feeling lightheaded. He admits to using a lot of nicotine down as well as drinking a lot of five-hour energy drinks an a lot of coffee and espresso. She's also been smoking cigars. He feels that this may be contributing to his symptoms. He is able to work out at [employer], lifting weights without any difficulty and without any symptoms of chest pain heart letter or dizziness. The patient has repeat CBC performed earlier today and he is here for evaluation and recommendation regarding his condition.  MEDICAL HISTORY: Past Medical History  Diagnosis Date  . Hepatitis C 04/29/2012    ALLERGIES:  has No Known Allergies.  MEDICATIONS:  Current Outpatient Prescriptions  Medication Sig Dispense Refill  . Aspirin-Acetaminophen-Caffeine (GOODY HEADACHE PO) Take by mouth as needed.      . ribavirin (REBETOL) 200 MG capsule Take 200 mg by mouth daily.      [name] SOVALDI 400 MG TABS Take 400 mg by mouth daily.      . TESTIM 50 MG/5GM GEL       . valACYclovir (VALTREX) 500 MG tablet        No current facility-administered medications for this visit.    REVIEW OF SYSTEMS:  A  comprehensive review of systems was negative.   PHYSICAL EXAMINATION: General appearance: alert, cooperative and no distress Head: Normocephalic, without obvious abnormality, atraumatic Neck: no adenopathy Lymph nodes: Cervical, supraclavicular, and axillary nodes normal. Resp: clear to auscultation bilaterally Cardio: regular rate and rhythm, S1, S2 normal, no murmur, click, rub or gallop GI: soft, non-tender; bowel sounds normal; no masses,  no organomegaly Extremities: extremities normal, atraumatic, no cyanosis or edema  ECOG PERFORMANCE STATUS: 0 - Asymptomatic  Blood pressure 140/84, pulse 80, temperature 98.4 F (36.9 C), temperature source Oral, resp. rate 18, height 5\' 9"  (1.753 m), weight 198 lb (89.812 kg).  LABORATORY DATA: Lab Results  Component Value Date   WBC 6.8 11/06/2012   HGB 16.8 11/06/2012   HCT 49.1 11/06/2012   MCV 91.3 11/06/2012   PLT 215 11/06/2012      Chemistry      Component Value Date/Time   NA 138 11/06/2012 1425   NA 138 08/22/2011 0923   K 4.1 11/06/2012 1425   K 4.1 08/22/2011 0923   CL 106 08/06/2012 1536   CL 107 08/22/2011 0923   CO2 22 11/06/2012 1425   CO2 23 08/22/2011 0923   BUN 22.4 11/06/2012 1425   BUN 22 08/22/2011 0923   CREATININE 1.1 11/06/2012 1425   CREATININE 1.08 08/22/2011 0923      Component Value Date/Time   CALCIUM 9.1 11/06/2012 1425   CALCIUM 9.1 08/22/2011 0923  ALKPHOS 58 11/06/2012 1425   ALKPHOS 51 08/22/2011 0923   AST 20 11/06/2012 1425   AST 27 08/22/2011 0923   ALT 23 11/06/2012 1425   ALT 33 08/22/2011 0923   BILITOT 0.23 11/06/2012 1425   BILITOT 0.5 08/22/2011 0923       RADIOGRAPHIC STUDIES: No results found.  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   PLAN: The patient is doing fine today in his hemoglobin and hematocrit are elevated at hemoglobin 16.8 and hematocrit of 49.1. He will require phlebotomy today. Advised patient to  this to decrease her totally discontinue his use of nicotine down, hilar energy drinks is coffee consumption and certainly the smoking of cigars. Is also increase his water intake. If his symptoms of feeling his heart flutter and lightheadedness persists he is to followup with his primary care physician for further evaluation and management.  He'll followup in one month with repeat CBC differential C. met and LDH and possible phlebotomy appointment if needed.  Leahanna Buser E, PA-C   He was advised to call immediately if he has any concerning symptoms in the interval.  All questions were answered. The patient knows to call the clinic with any problems, questions or concerns. We can certainly see the patient much sooner if necessary.

## 2012-12-01 ENCOUNTER — Other Ambulatory Visit: Payer: Self-pay | Admitting: *Deleted

## 2012-12-02 ENCOUNTER — Telehealth: Payer: Self-pay | Admitting: Internal Medicine

## 2012-12-02 ENCOUNTER — Telehealth: Payer: Self-pay | Admitting: *Deleted

## 2012-12-02 NOTE — Telephone Encounter (Signed)
Sent michelle a staff message to add the pt in for a plebotomy appt on 12/10/2012 to follow the md visit.

## 2012-12-02 NOTE — Telephone Encounter (Signed)
Per staff message and POF I have scheduled appts.  JMW  

## 2012-12-03 ENCOUNTER — Ambulatory Visit: Payer: BC Managed Care – PPO | Admitting: Physician Assistant

## 2012-12-03 ENCOUNTER — Telehealth: Payer: Self-pay | Admitting: Internal Medicine

## 2012-12-03 ENCOUNTER — Other Ambulatory Visit: Payer: BC Managed Care – PPO

## 2012-12-03 NOTE — Telephone Encounter (Signed)
lmonvm of the pt regarding his r/s appts to oct.

## 2012-12-10 ENCOUNTER — Other Ambulatory Visit: Payer: BC Managed Care – PPO

## 2012-12-10 ENCOUNTER — Ambulatory Visit: Payer: BC Managed Care – PPO | Admitting: Physician Assistant

## 2012-12-18 ENCOUNTER — Telehealth: Payer: Self-pay | Admitting: Internal Medicine

## 2012-12-18 ENCOUNTER — Telehealth: Payer: Self-pay | Admitting: *Deleted

## 2012-12-18 NOTE — Telephone Encounter (Signed)
pt called to r/s missed appt...done.Marland Kitchenemialed MW to add phlebotomy

## 2012-12-18 NOTE — Telephone Encounter (Signed)
Per staff message and POF I have scheduled appts.  JMW  

## 2012-12-24 ENCOUNTER — Other Ambulatory Visit (HOSPITAL_BASED_OUTPATIENT_CLINIC_OR_DEPARTMENT_OTHER): Payer: BC Managed Care – PPO | Admitting: Lab

## 2012-12-24 ENCOUNTER — Ambulatory Visit (HOSPITAL_BASED_OUTPATIENT_CLINIC_OR_DEPARTMENT_OTHER): Payer: BC Managed Care – PPO | Admitting: Physician Assistant

## 2012-12-24 ENCOUNTER — Ambulatory Visit (HOSPITAL_BASED_OUTPATIENT_CLINIC_OR_DEPARTMENT_OTHER): Payer: BC Managed Care – PPO

## 2012-12-24 ENCOUNTER — Telehealth: Payer: Self-pay | Admitting: Internal Medicine

## 2012-12-24 ENCOUNTER — Encounter: Payer: Self-pay | Admitting: Physician Assistant

## 2012-12-24 VITALS — BP 115/70 | HR 85 | Temp 97.6°F | Resp 20 | Ht 69.0 in | Wt 197.0 lb

## 2012-12-24 DIAGNOSIS — D45 Polycythemia vera: Secondary | ICD-10-CM

## 2012-12-24 DIAGNOSIS — D751 Secondary polycythemia: Secondary | ICD-10-CM

## 2012-12-24 LAB — COMPREHENSIVE METABOLIC PANEL (CC13)
ALT: 21 U/L (ref 0–55)
AST: 24 U/L (ref 5–34)
Albumin: 3.5 g/dL (ref 3.5–5.0)
Alkaline Phosphatase: 61 U/L (ref 40–150)
Anion Gap: 9 mEq/L (ref 3–11)
BUN: 31.4 mg/dL — ABNORMAL HIGH (ref 7.0–26.0)
CO2: 22 mEq/L (ref 22–29)
Calcium: 9.2 mg/dL (ref 8.4–10.4)
Chloride: 107 mEq/L (ref 98–109)
Creatinine: 2 mg/dL — ABNORMAL HIGH (ref 0.7–1.3)
Glucose: 102 mg/dl (ref 70–140)
Potassium: 4 mEq/L (ref 3.5–5.1)
Sodium: 138 mEq/L (ref 136–145)
Total Bilirubin: 0.39 mg/dL (ref 0.20–1.20)
Total Protein: 7.2 g/dL (ref 6.4–8.3)

## 2012-12-24 LAB — CBC WITH DIFFERENTIAL/PLATELET
BASO%: 0.6 % (ref 0.0–2.0)
Basophils Absolute: 0 10*3/uL (ref 0.0–0.1)
EOS%: 2.6 % (ref 0.0–7.0)
Eosinophils Absolute: 0.2 10*3/uL (ref 0.0–0.5)
HCT: 49.6 % (ref 38.4–49.9)
HGB: 16.7 g/dL (ref 13.0–17.1)
LYMPH%: 21.2 % (ref 14.0–49.0)
MCH: 30.4 pg (ref 27.2–33.4)
MCHC: 33.6 g/dL (ref 32.0–36.0)
MCV: 90.4 fL (ref 79.3–98.0)
MONO#: 0.6 10*3/uL (ref 0.1–0.9)
MONO%: 8.3 % (ref 0.0–14.0)
NEUT#: 4.8 10*3/uL (ref 1.5–6.5)
NEUT%: 67.3 % (ref 39.0–75.0)
Platelets: 211 10*3/uL (ref 140–400)
RBC: 5.49 10*6/uL (ref 4.20–5.82)
RDW: 14.3 % (ref 11.0–14.6)
WBC: 7.1 10*3/uL (ref 4.0–10.3)
lymph#: 1.5 10*3/uL (ref 0.9–3.3)

## 2012-12-24 LAB — LACTATE DEHYDROGENASE (CC13): LDH: 193 U/L (ref 125–245)

## 2012-12-24 NOTE — Patient Instructions (Addendum)
Decrease your intake of protein as discussed Continue monthly lab and phlebotomy as needed Follow up in 3 months

## 2012-12-24 NOTE — Progress Notes (Signed)
500 grams removed over 5 minutes using a 16 gauge phlebotomy set. Patient tolerated well. Nourishment provided.

## 2012-12-24 NOTE — Patient Instructions (Signed)
Therapeutic Phlebotomy Therapeutic phlebotomy is the controlled removal of blood from your body for the purpose of treating a medical condition. It is similar to donating blood. Usually, about a pint (470 mL) of blood is removed. The average adult has 9 to 12 pints (4.3 to 5.7 L) of blood. Therapeutic phlebotomy may be used to treat the following medical conditions:  Hemochromatosis. This is a condition in which there is too much iron in the blood.  Polycythemia vera. This is a condition in which there are too many red cells in the blood.  Porphyria cutanea tarda. This is a disease usually passed from one generation to the next (inherited). It is a condition in which an important part of hemoglobin is not made properly. This results in the build up of abnormal amounts of porphyrins in the body.  Sickle cell disease. This is an inherited disease. It is a condition in which the red blood cells form an abnormal crescent shape rather than a round shape. LET YOUR CAREGIVER KNOW ABOUT:  Allergies.  Medicines taken including herbs, eyedrops, over-the-counter medicines, and creams.  Use of steroids (by mouth or creams).  Previous problems with anesthetics or numbing medicine.  History of blood clots.  History of bleeding or blood problems.  Previous surgery.  Possibility of pregnancy, if this applies. RISKS AND COMPLICATIONS This is a simple and safe procedure. Problems are unlikely. However, problems can occur and may include:  Nausea or lightheadedness.  Low blood pressure.  Soreness, bleeding, swelling, or bruising at the needle insertion site.  Infection. BEFORE THE PROCEDURE  This is a procedure that can be done as an outpatient. Confirm the time that you need to arrive for your procedure. Confirm whether there is a need to fast or withhold any medications. It is helpful to wear clothing with sleeves that can be raised above the elbow. A blood sample may be done to determine the  amount of red blood cells or iron in your blood. Plan ahead of time to have someone drive you home after the procedure. PROCEDURE The entire procedure from preparation through recovery takes about 1 hour. The actual collection takes about 10 to 15 minutes.  A needle will be inserted into your vein.  Tubing and a collection bag will be attached to that needle.  Blood will flow through the needle and tubing into the collection bag.  You may be asked to open and close your hand slowly and continuously during the entire collection.  Once the specified amount of blood has been removed from your body, the collection bag and tubing will be clamped.  The needle will be removed.  Pressure will be held on the site of the needle insertion to stop the bleeding. Then a bandage will be placed over the needle insertion site. AFTER THE PROCEDURE  Your recovery will be assessed and monitored. If there are no problems, as an outpatient, you should be able to go home shortly after the procedure.  Document Released: 07/31/2010 Document Revised: 05/21/2011 Document Reviewed: 07/31/2010 [employer] Patient Information 2014 [employer].  

## 2012-12-24 NOTE — Telephone Encounter (Signed)
gv and printed appt sched and avs for NOv thru Jan 2015

## 2012-12-25 ENCOUNTER — Telehealth: Payer: Self-pay | Admitting: *Deleted

## 2012-12-25 NOTE — Progress Notes (Addendum)
[facility] [phone]   [phone]  SHARED VISIT PROGRESS NOTE  No PCP Per Patient [address] 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   PRIOR THERAPY: None   CURRENT THERAPY: Phlebotomy on an as-needed basis noted to keep the hematocrit at 45% or less  INTERVAL HISTORY: James [name] 52 y.o. male returns to the clinic today for his pain the patient is feeling fine today with no specific complaints. He is status post treatment with Ribavirin for hepatitis C and toleratedit fairly well. He states that his doctor has declared him "healed" from hepatitis C. Since being seen last month he is been working out at [employer] and has significantly increased his protein intake in the form of protein shakes as well as needs. He did discontinue the 5 hour energy shots, caffeine and of cigars and has had no further heart palpitations. He denied having any significant fatigue or weakness. He denied having any weight loss or night sweats. He has no chest pain, shortness breath, cough or hemoptysis.  The patient has repeat CBC performed earlier today and he is here for evaluation and recommendation regarding his condition.  MEDICAL HISTORY: Past Medical History  Diagnosis Date  . Hepatitis C 04/29/2012    ALLERGIES:  has No Known Allergies.  MEDICATIONS:  Current Outpatient Prescriptions  Medication Sig Dispense Refill  . Aspirin-Acetaminophen-Caffeine (GOODY HEADACHE PO) Take by mouth as needed.      . TESTIM 50 MG/5GM GEL       . valACYclovir (VALTREX) 500 MG tablet        No current facility-administered medications for this visit.    REVIEW OF SYSTEMS:  A comprehensive review of systems was negative.   PHYSICAL EXAMINATION: General appearance: alert, cooperative and no distress Head: Normocephalic, without obvious abnormality, atraumatic Neck: no adenopathy Lymph  nodes: Cervical, supraclavicular, and axillary nodes normal. Resp: clear to auscultation bilaterally Cardio: regular rate and rhythm, S1, S2 normal, no murmur, click, rub or gallop GI: soft, non-tender; bowel sounds normal; no masses,  no organomegaly Extremities: extremities normal, atraumatic, no cyanosis or edema  ECOG PERFORMANCE STATUS: 0 - Asymptomatic  Blood pressure 115/70, pulse 85, temperature 97.6 F (36.4 C), temperature source Oral, resp. rate 20, height 5\' 9"  (1.753 m), weight 197 lb (89.359 kg).  LABORATORY DATA: Lab Results  Component Value Date   WBC 7.1 12/24/2012   HGB 16.7 12/24/2012   HCT 49.6 12/24/2012   MCV 90.4 12/24/2012   PLT 211 12/24/2012      Chemistry      Component Value Date/Time   NA 138 12/24/2012 1440   NA 138 08/22/2011 0923   K 4.0 12/24/2012 1440   K 4.1 08/22/2011 0923   CL 106 08/06/2012 1536   CL 107 08/22/2011 0923   CO2 22 12/24/2012 1440   CO2 23 08/22/2011 0923   BUN 31.4* 12/24/2012 1440   BUN 22 08/22/2011 0923   CREATININE 2.0* 12/24/2012 1440   CREATININE 1.08 08/22/2011 0923      Component Value Date/Time   CALCIUM 9.2 12/24/2012 1440   CALCIUM 9.1 08/22/2011 0923   ALKPHOS 61 12/24/2012 1440   ALKPHOS 51 08/22/2011 0923   AST 24 12/24/2012 1440   AST 27 08/22/2011 0923   ALT 21 12/24/2012 1440   ALT 33 08/22/2011 0923   BILITOT 0.39 12/24/2012 1440   BILITOT 0.5 08/22/2011 5284  RADIOGRAPHIC STUDIES: No results found.  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   PLAN: The patient is doing fine today in his hemoglobin and hematocrit are elevated at hemoglobin 16.7 and hematocrit of 49.6. He will require phlebotomy today. Patient was discussed with an also seen by Dr. Arbutus Ped. His chemistries became available and reveals a an increase creatinine of 2.0. His creatinine last month was 1.1. Patient was asked to decrease his increased protein  intake. He'll return on a monthly basis with a CBC and possible phlebotomy appointment. We are looking to keep his hematocrit around 45%. We'll plan to recheck his chemistries next month to ensure that his creatinine has returned to normal range. He'll followup with Dr. Arbutus Ped in 3 months for another symptom management visit. He lives CBC differential, C. met LDH at that visit as well as a phlebotomy appointment if needed.   Kataryna Mcquilkin E, PA-C   He was advised to call immediately if he has any concerning symptoms in the interval.  All questions were answered. The patient knows to call the clinic with any problems, questions or concerns. We can certainly see the patient much sooner if necessary.  ADDENDUM: Hematology/Oncology Attending: I had a face to face encounter with the patient. I recommended his care plan. This is a very pleasant 52 years old white male with reactive polycythemia was negative JAK-2 mutation most likely secondary to hormonal treatment and he required phlebotomy on as-needed basis. His CBC today showed elevated hematocrit. I recommended for the patient to proceed with phlebotomy today. He was also found to have elevated serum creatinine and the patient has been on several over-the-counter high-protein supplement. I strongly recommended for the patient to discontinue the high-protein supplement at this point. I will check his comprehensive metabolic panel in one month to ensure normalization of his serum creatinine. He was also advised to follow up with his primary care physician because of the renal insufficiency. He would come back for follow up visit in 3 months for reevaluation with repeat CBC and phlebotomy as needed. Lajuana Matte., MD 12/27/2012

## 2012-12-25 NOTE — Telephone Encounter (Signed)
Per staff message and POF I have scheduled appts.  JMW  

## 2013-01-21 ENCOUNTER — Telehealth: Payer: Self-pay | Admitting: Internal Medicine

## 2013-01-21 ENCOUNTER — Ambulatory Visit (HOSPITAL_BASED_OUTPATIENT_CLINIC_OR_DEPARTMENT_OTHER): Payer: BC Managed Care – PPO

## 2013-01-21 ENCOUNTER — Other Ambulatory Visit: Payer: Self-pay | Admitting: Medical Oncology

## 2013-01-21 ENCOUNTER — Other Ambulatory Visit (HOSPITAL_BASED_OUTPATIENT_CLINIC_OR_DEPARTMENT_OTHER): Payer: BC Managed Care – PPO

## 2013-01-21 ENCOUNTER — Other Ambulatory Visit: Payer: Self-pay | Admitting: Internal Medicine

## 2013-01-21 DIAGNOSIS — D45 Polycythemia vera: Secondary | ICD-10-CM

## 2013-01-21 DIAGNOSIS — D751 Secondary polycythemia: Secondary | ICD-10-CM

## 2013-01-21 LAB — CBC WITH DIFFERENTIAL/PLATELET
BASO%: 0.6 % (ref 0.0–2.0)
Basophils Absolute: 0 10*3/uL (ref 0.0–0.1)
EOS%: 2.1 % (ref 0.0–7.0)
Eosinophils Absolute: 0.2 10*3/uL (ref 0.0–0.5)
HCT: 48.5 % (ref 38.4–49.9)
HGB: 16 g/dL (ref 13.0–17.1)
LYMPH%: 20.9 % (ref 14.0–49.0)
MCH: 30.3 pg (ref 27.2–33.4)
MCHC: 33 g/dL (ref 32.0–36.0)
MCV: 91.6 fL (ref 79.3–98.0)
MONO#: 0.5 10*3/uL (ref 0.1–0.9)
MONO%: 6.5 % (ref 0.0–14.0)
NEUT#: 5.7 10*3/uL (ref 1.5–6.5)
NEUT%: 69.9 % (ref 39.0–75.0)
Platelets: 231 10*3/uL (ref 140–400)
RBC: 5.29 10*6/uL (ref 4.20–5.82)
RDW: 15 % — ABNORMAL HIGH (ref 11.0–14.6)
WBC: 8.1 10*3/uL (ref 4.0–10.3)
lymph#: 1.7 10*3/uL (ref 0.9–3.3)

## 2013-01-21 LAB — COMPREHENSIVE METABOLIC PANEL (CC13)
ALT: 25 U/L (ref 0–55)
AST: 19 U/L (ref 5–34)
Albumin: 3.5 g/dL (ref 3.5–5.0)
Alkaline Phosphatase: 61 U/L (ref 40–150)
Anion Gap: 9 mEq/L (ref 3–11)
BUN: 22 mg/dL (ref 7.0–26.0)
CO2: 23 mEq/L (ref 22–29)
Calcium: 9.4 mg/dL (ref 8.4–10.4)
Chloride: 108 mEq/L (ref 98–109)
Creatinine: 1.1 mg/dL (ref 0.7–1.3)
Glucose: 99 mg/dl (ref 70–140)
Potassium: 4.3 mEq/L (ref 3.5–5.1)
Sodium: 140 mEq/L (ref 136–145)
Total Bilirubin: 0.36 mg/dL (ref 0.20–1.20)
Total Protein: 6.9 g/dL (ref 6.4–8.3)

## 2013-01-21 NOTE — Patient Instructions (Signed)
Therapeutic Phlebotomy Therapeutic phlebotomy is the controlled removal of blood from your body for the purpose of treating a medical condition. It is similar to donating blood. Usually, about a pint (470 mL) of blood is removed. The average adult has 9 to 12 pints (4.3 to 5.7 L) of blood. Therapeutic phlebotomy may be used to treat the following medical conditions:  Hemochromatosis. This is a condition in which there is too much iron in the blood.  Polycythemia vera. This is a condition in which there are too many red cells in the blood.  Porphyria cutanea tarda. This is a disease usually passed from one generation to the next (inherited). It is a condition in which an important part of hemoglobin is not made properly. This results in the build up of abnormal amounts of porphyrins in the body.  Sickle cell disease. This is an inherited disease. It is a condition in which the red blood cells form an abnormal crescent shape rather than a round shape. LET YOUR CAREGIVER KNOW ABOUT:  Allergies.  Medicines taken including herbs, eyedrops, over-the-counter medicines, and creams.  Use of steroids (by mouth or creams).  Previous problems with anesthetics or numbing medicine.  History of blood clots.  History of bleeding or blood problems.  Previous surgery.  Possibility of pregnancy, if this applies. RISKS AND COMPLICATIONS This is a simple and safe procedure. Problems are unlikely. However, problems can occur and may include:  Nausea or lightheadedness.  Low blood pressure.  Soreness, bleeding, swelling, or bruising at the needle insertion site.  Infection. BEFORE THE PROCEDURE  This is a procedure that can be done as an outpatient. Confirm the time that you need to arrive for your procedure. Confirm whether there is a need to fast or withhold any medications. It is helpful to wear clothing with sleeves that can be raised above the elbow. A blood sample may be done to determine the  amount of red blood cells or iron in your blood. Plan ahead of time to have someone drive you home after the procedure. PROCEDURE The entire procedure from preparation through recovery takes about 1 hour. The actual collection takes about 10 to 15 minutes.  A needle will be inserted into your vein.  Tubing and a collection bag will be attached to that needle.  Blood will flow through the needle and tubing into the collection bag.  You may be asked to open and close your hand slowly and continuously during the entire collection.  Once the specified amount of blood has been removed from your body, the collection bag and tubing will be clamped.  The needle will be removed.  Pressure will be held on the site of the needle insertion to stop the bleeding. Then a bandage will be placed over the needle insertion site. AFTER THE PROCEDURE  Your recovery will be assessed and monitored. If there are no problems, as an outpatient, you should be able to go home shortly after the procedure.  Document Released: 07/31/2010 Document Revised: 05/21/2011 Document Reviewed: 07/31/2010 [employer] Patient Information 2014 [employer].  

## 2013-01-21 NOTE — Telephone Encounter (Signed)
Pt came by and informed me that he always see MD or ML on lab visit every months, went to see Dr. Arbutus Ped he stated that once pt resulltis seen he will order phlebotomy

## 2013-01-21 NOTE — Progress Notes (Signed)
Dr Arbutus Ped ordered  i unit phlebotomy  today. Pt tolerated well.

## 2013-02-18 ENCOUNTER — Other Ambulatory Visit: Payer: BC Managed Care – PPO

## 2013-02-18 ENCOUNTER — Telehealth: Payer: Self-pay | Admitting: Internal Medicine

## 2013-02-18 NOTE — Telephone Encounter (Signed)
s.w. pt on incomming call adn he wanted to have est and phlebotomy..per pof pt does not need until 1.7.15..transferred pt to desk nurse.

## 2013-02-19 ENCOUNTER — Other Ambulatory Visit (HOSPITAL_BASED_OUTPATIENT_CLINIC_OR_DEPARTMENT_OTHER): Payer: BC Managed Care – PPO

## 2013-02-19 ENCOUNTER — Ambulatory Visit (HOSPITAL_BASED_OUTPATIENT_CLINIC_OR_DEPARTMENT_OTHER): Payer: BC Managed Care – PPO

## 2013-02-19 ENCOUNTER — Other Ambulatory Visit: Payer: Self-pay | Admitting: Medical Oncology

## 2013-02-19 DIAGNOSIS — D45 Polycythemia vera: Secondary | ICD-10-CM

## 2013-02-19 DIAGNOSIS — D751 Secondary polycythemia: Secondary | ICD-10-CM

## 2013-02-19 LAB — CBC WITH DIFFERENTIAL/PLATELET
BASO%: 0.2 % (ref 0.0–2.0)
Basophils Absolute: 0 10*3/uL (ref 0.0–0.1)
EOS%: 2.7 % (ref 0.0–7.0)
Eosinophils Absolute: 0.2 10*3/uL (ref 0.0–0.5)
HCT: 47.9 % (ref 38.4–49.9)
HGB: 16 g/dL (ref 13.0–17.1)
LYMPH%: 24.6 % (ref 14.0–49.0)
MCH: 31 pg (ref 27.2–33.4)
MCHC: 33.4 g/dL (ref 32.0–36.0)
MCV: 92.7 fL (ref 79.3–98.0)
MONO#: 0.5 10*3/uL (ref 0.1–0.9)
MONO%: 7 % (ref 0.0–14.0)
NEUT#: 4.8 10*3/uL (ref 1.5–6.5)
NEUT%: 65.5 % (ref 39.0–75.0)
Platelets: 229 10*3/uL (ref 140–400)
RBC: 5.16 10*6/uL (ref 4.20–5.82)
RDW: 14.3 % (ref 11.0–14.6)
WBC: 7.3 10*3/uL (ref 4.0–10.3)
lymph#: 1.8 10*3/uL (ref 0.9–3.3)

## 2013-03-17 ENCOUNTER — Telehealth: Payer: Self-pay | Admitting: Internal Medicine

## 2013-03-17 NOTE — Telephone Encounter (Signed)
s.w pt called to r/s appt....done...pt ok and ok

## 2013-03-18 ENCOUNTER — Ambulatory Visit: Payer: BC Managed Care – PPO | Admitting: Internal Medicine

## 2013-03-18 ENCOUNTER — Other Ambulatory Visit: Payer: BC Managed Care – PPO

## 2013-03-25 ENCOUNTER — Ambulatory Visit (HOSPITAL_BASED_OUTPATIENT_CLINIC_OR_DEPARTMENT_OTHER): Payer: BC Managed Care – PPO | Admitting: Internal Medicine

## 2013-03-25 ENCOUNTER — Other Ambulatory Visit (HOSPITAL_BASED_OUTPATIENT_CLINIC_OR_DEPARTMENT_OTHER): Payer: BC Managed Care – PPO

## 2013-03-25 ENCOUNTER — Encounter: Payer: Self-pay | Admitting: Internal Medicine

## 2013-03-25 ENCOUNTER — Ambulatory Visit (HOSPITAL_BASED_OUTPATIENT_CLINIC_OR_DEPARTMENT_OTHER): Payer: BC Managed Care – PPO

## 2013-03-25 ENCOUNTER — Telehealth: Payer: Self-pay | Admitting: Internal Medicine

## 2013-03-25 VITALS — BP 130/76 | HR 79 | Resp 20

## 2013-03-25 VITALS — BP 142/92 | HR 84 | Temp 97.9°F | Resp 18 | Ht 69.0 in | Wt 203.8 lb

## 2013-03-25 DIAGNOSIS — D751 Secondary polycythemia: Secondary | ICD-10-CM

## 2013-03-25 DIAGNOSIS — D45 Polycythemia vera: Secondary | ICD-10-CM

## 2013-03-25 LAB — COMPREHENSIVE METABOLIC PANEL (CC13)
ALT: 23 U/L (ref 0–55)
AST: 19 U/L (ref 5–34)
Albumin: 3.6 g/dL (ref 3.5–5.0)
Alkaline Phosphatase: 62 U/L (ref 40–150)
Anion Gap: 9 mEq/L (ref 3–11)
BUN: 28.3 mg/dL — ABNORMAL HIGH (ref 7.0–26.0)
CO2: 24 mEq/L (ref 22–29)
Calcium: 9 mg/dL (ref 8.4–10.4)
Chloride: 106 mEq/L (ref 98–109)
Creatinine: 1.2 mg/dL (ref 0.7–1.3)
Glucose: 105 mg/dl (ref 70–140)
Potassium: 4.2 mEq/L (ref 3.5–5.1)
Sodium: 140 mEq/L (ref 136–145)
Total Bilirubin: 0.3 mg/dL (ref 0.20–1.20)
Total Protein: 6.8 g/dL (ref 6.4–8.3)

## 2013-03-25 LAB — CBC WITH DIFFERENTIAL/PLATELET
BASO%: 0.5 % (ref 0.0–2.0)
Basophils Absolute: 0 10*3/uL (ref 0.0–0.1)
EOS%: 2 % (ref 0.0–7.0)
Eosinophils Absolute: 0.2 10*3/uL (ref 0.0–0.5)
HCT: 49.7 % (ref 38.4–49.9)
HGB: 16.6 g/dL (ref 13.0–17.1)
LYMPH%: 19.2 % (ref 14.0–49.0)
MCH: 30.2 pg (ref 27.2–33.4)
MCHC: 33.3 g/dL (ref 32.0–36.0)
MCV: 90.6 fL (ref 79.3–98.0)
MONO#: 0.5 10*3/uL (ref 0.1–0.9)
MONO%: 6.8 % (ref 0.0–14.0)
NEUT#: 5.5 10*3/uL (ref 1.5–6.5)
NEUT%: 71.5 % (ref 39.0–75.0)
Platelets: 212 10*3/uL (ref 140–400)
RBC: 5.48 10*6/uL (ref 4.20–5.82)
RDW: 13.4 % (ref 11.0–14.6)
WBC: 7.7 10*3/uL (ref 4.0–10.3)
lymph#: 1.5 10*3/uL (ref 0.9–3.3)

## 2013-03-25 LAB — LACTATE DEHYDROGENASE (CC13): LDH: 170 U/L (ref 125–245)

## 2013-03-25 NOTE — Progress Notes (Signed)
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   PRIOR THERAPY: None   CURRENT THERAPY: Phlebotomy on an as-needed basis noted to keep the hematocrit at 45% or less  INTERVAL HISTORY: James [name] 53 y.o. male returns to the clinic today for followup visit. The patient is feeling fine today with no specific complaints. He denied having any significant fatigue or weakness. He denied having any weight loss or night sweats. He has no chest pain, shortness of breath, cough or hemoptysis. He denied having any significant bleeding issues. The patient continues to use androgen for muscle building. He has repeat CBC and comprehensive metabolic panel performed earlier today and he is here for evaluation and discussion of his lab results.  MEDICAL HISTORY: Past Medical History  Diagnosis Date  . Hepatitis C 04/29/2012    ALLERGIES:  has No Known Allergies.  MEDICATIONS:  Current Outpatient Prescriptions  Medication Sig Dispense Refill  . Aspirin-Acetaminophen-Caffeine (GOODY HEADACHE PO) Take by mouth as needed.      . TESTIM 50 MG/5GM GEL       . valACYclovir (VALTREX) 500 MG tablet        No current facility-administered medications for this visit.    REVIEW OF SYSTEMS:  A comprehensive review of systems was negative.   PHYSICAL EXAMINATION: General appearance: alert, cooperative and no distress Head: Normocephalic, without obvious abnormality, atraumatic Neck: no adenopathy, no JVD, supple, symmetrical, trachea midline and thyroid not enlarged, symmetric, no tenderness/mass/nodules Lymph nodes: Cervical, supraclavicular, and axillary nodes normal. Resp: clear to auscultation bilaterally Back: symmetric, no curvature. ROM normal. No CVA tenderness. Cardio: regular rate and  rhythm, S1, S2 normal, no murmur, click, rub or gallop GI: soft, non-tender; bowel sounds normal; no masses,  no organomegaly Extremities: extremities normal, atraumatic, no cyanosis or edema  ECOG PERFORMANCE STATUS: 0 - Asymptomatic  Blood pressure 142/92, pulse 84, temperature 97.9 F (36.6 C), temperature source Oral, resp. rate 18, height 5\' 9"  (1.753 m), weight 203 lb 12.8 oz (92.443 kg).  LABORATORY DATA: Lab Results  Component Value Date   WBC 7.7 03/25/2013   HGB 16.6 03/25/2013   HCT 49.7 03/25/2013   MCV 90.6 03/25/2013   PLT 212 03/25/2013      Chemistry      Component Value Date/Time   NA 140 01/21/2013 1440   NA 138 08/22/2011 0923   K 4.3 01/21/2013 1440   K 4.1 08/22/2011 0923   CL 106 08/06/2012 1536   CL 107 08/22/2011 0923   CO2 23 01/21/2013 1440   CO2 23 08/22/2011 0923   BUN 22.0 01/21/2013 1440   BUN 22 08/22/2011 0923   CREATININE 1.1 01/21/2013 1440   CREATININE 1.08 08/22/2011 0923      Component Value Date/Time   CALCIUM 9.4 01/21/2013 1440   CALCIUM 9.1 08/22/2011 0923   ALKPHOS 61 01/21/2013 1440   ALKPHOS 51 08/22/2011 0923   AST 19 01/21/2013 1440   AST 27 08/22/2011 0923   ALT 25 01/21/2013 1440   ALT 33 08/22/2011 0923   BILITOT 0.36 01/21/2013 1440   BILITOT 0.5 08/22/2011 0923       RADIOGRAPHIC STUDIES: No results found.  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 The patient is doing fine today. I gave him the option of observation versus proceeding with phlebotomy since his hematocrit is 49.7%. He would like to proceed with phlebotomy today as scheduled. I was him back for followup visit in 3 months with repeat CBC and comprehensive metabolic panel. He was advised to call immediately if he has any concerning symptoms in the interval. The patient voices  understanding of current disease status and treatment options and is in agreement with the current care plan.  All questions were answered. The patient knows to call the clinic with any problems, questions or concerns. We can certainly see the patient much sooner if necessary.  Disclaimer: This note was dictated with voice recognition software. Similar sounding words can inadvertently be transcribed and may not be corrected upon review.

## 2013-03-25 NOTE — Patient Instructions (Signed)

## 2013-03-25 NOTE — Patient Instructions (Signed)
Smoking Cessation Quitting smoking is important to your health and has many advantages. However, it is not always easy to quit since nicotine is a very addictive drug. Often times, people try 3 times or more before being able to quit. This document explains the best ways for you to prepare to quit smoking. Quitting takes hard work and a lot of effort, but you can do it. ADVANTAGES OF QUITTING SMOKING  You will live longer, feel better, and live better.  Your body will feel the impact of quitting smoking almost immediately.  Within 20 minutes, blood pressure decreases. Your pulse returns to its normal level.  After 8 hours, carbon monoxide levels in the blood return to normal. Your oxygen level increases.  After 24 hours, the chance of having a heart attack starts to decrease. Your breath, hair, and body stop smelling like smoke.  After 48 hours, damaged nerve endings begin to recover. Your sense of taste and smell improve.  After 72 hours, the body is virtually free of nicotine. Your bronchial tubes relax and breathing becomes easier.  After 2 to 12 weeks, lungs can hold more air. Exercise becomes easier and circulation improves.  The risk of having a heart attack, stroke, cancer, or lung disease is greatly reduced.  After 1 year, the risk of coronary heart disease is cut in half.  After 5 years, the risk of stroke falls to the same as a nonsmoker.  After 10 years, the risk of lung cancer is cut in half and the risk of other cancers decreases significantly.  After 15 years, the risk of coronary heart disease drops, usually to the level of a nonsmoker.  If you are pregnant, quitting smoking will improve your chances of having a healthy baby.  The people you live with, especially any children, will be healthier.  You will have extra money to spend on things other than cigarettes. QUESTIONS TO THINK ABOUT BEFORE ATTEMPTING TO QUIT You may want to talk about your answers with your  caregiver.  Why do you want to quit?  If you tried to quit in the past, what helped and what did not?  What will be the most difficult situations for you after you quit? How will you plan to handle them?  Who can help you through the tough times? Your family? Friends? A caregiver?  What pleasures do you get from smoking? What ways can you still get pleasure if you quit? Here are some questions to ask your caregiver:  How can you help me to be successful at quitting?  What medicine do you think would be best for me and how should I take it?  What should I do if I need more help?  What is smoking withdrawal like? How can I get information on withdrawal? GET READY  Set a quit date.  Change your environment by getting rid of all cigarettes, ashtrays, matches, and lighters in your home, car, or work. Do not let people smoke in your home.  Review your past attempts to quit. Think about what worked and what did not. GET SUPPORT AND ENCOURAGEMENT You have a better chance of being successful if you have help. You can get support in many ways.  Tell your family, friends, and co-workers that you are going to quit and need their support. Ask them not to smoke around you.  Get individual, group, or telephone counseling and support. Programs are available at local hospitals and health centers. Call your local health department for   information about programs in your area.  Spiritual beliefs and practices may help some smokers quit.  Download a "quit meter" on your computer to keep track of quit statistics, such as how long you have gone without smoking, cigarettes not smoked, and money saved.  Get a self-help book about quitting smoking and staying off of tobacco. LEARN NEW SKILLS AND BEHAVIORS  Distract yourself from urges to smoke. Talk to someone, go for a walk, or occupy your time with a task.  Change your normal routine. Take a different route to work. Drink tea instead of coffee.  Eat breakfast in a different place.  Reduce your stress. Take a hot bath, exercise, or read a book.  Plan something enjoyable to do every day. Reward yourself for not smoking.  Explore interactive web-based programs that specialize in helping you quit. GET MEDICINE AND USE IT CORRECTLY Medicines can help you stop smoking and decrease the urge to smoke. Combining medicine with the above behavioral methods and support can greatly increase your chances of successfully quitting smoking.  Nicotine replacement therapy helps deliver nicotine to your body without the negative effects and risks of smoking. Nicotine replacement therapy includes nicotine gum, lozenges, inhalers, nasal sprays, and skin patches. Some may be available over-the-counter and others require a prescription.  Antidepressant medicine helps people abstain from smoking, but how this works is unknown. This medicine is available by prescription.  Nicotinic receptor partial agonist medicine simulates the effect of nicotine in your brain. This medicine is available by prescription. Ask your caregiver for advice about which medicines to use and how to use them based on your health history. Your caregiver will tell you what side effects to look out for if you choose to be on a medicine or therapy. Carefully read the information on the package. Do not use any other product containing nicotine while using a nicotine replacement product.  RELAPSE OR DIFFICULT SITUATIONS Most relapses occur within the first 3 months after quitting. Do not be discouraged if you start smoking again. Remember, most people try several times before finally quitting. You may have symptoms of withdrawal because your body is used to nicotine. You may crave cigarettes, be irritable, feel very hungry, cough often, get headaches, or have difficulty concentrating. The withdrawal symptoms are only temporary. They are strongest when you first quit, but they will go away within  10 14 days. To reduce the chances of relapse, try to:  Avoid drinking alcohol. Drinking lowers your chances of successfully quitting.  Reduce the amount of caffeine you consume. Once you quit smoking, the amount of caffeine in your body increases and can give you symptoms, such as a rapid heartbeat, sweating, and anxiety.  Avoid smokers because they can make you want to smoke.  Do not let weight gain distract you. Many smokers will gain weight when they quit, usually less than 10 pounds. Eat a healthy diet and stay active. You can always lose the weight gained after you quit.  Find ways to improve your mood other than smoking. FOR MORE INFORMATION  www.smokefree.gov  Document Released: 02/20/2001 Document Revised: 08/28/2011 Document Reviewed: 06/07/2011 Exit[employer] Patient Information 2014 [employer].  

## 2013-03-25 NOTE — Telephone Encounter (Signed)
gv adn printed appt sched and avs for pt for April...sed added tx... °

## 2013-03-25 NOTE — Progress Notes (Signed)
Phlebotomized right AC with a 16 G needle and removed 500 mls of blood. Patient tolerated well. Patient was observed for 30 minutes post phlebotomy. Patient ate and drank afterwards. VSS. Patient denied dizziness and lightheadedness.

## 2013-05-22 ENCOUNTER — Telehealth: Payer: Self-pay | Admitting: Internal Medicine

## 2013-05-22 NOTE — Telephone Encounter (Signed)
lvm for pt regarding to April appt per MD pt has phleb every 50mths

## 2013-05-25 ENCOUNTER — Telehealth: Payer: Self-pay

## 2013-05-25 ENCOUNTER — Other Ambulatory Visit: Payer: Self-pay

## 2013-05-25 NOTE — Telephone Encounter (Signed)
Patient inquired about why is appointments were now 3 months apart when they used to be every month. Per documentation on 03/25/13 from last visit with Dr.Mohamed informed patient they discussed because his hematocrit was 49.7% he was given the option of option of observation vs phlembotomy. Dr.Mohamed also had advised for patient to return back in 3 months to repeat his CBC and CMET and F/U, and was advised to call back with any problems in between that time. I advised the patient he could discuss the f/u time periods more with Dr.Mohamed when we came back for his next f/u on 06/17/13. Patient denies any problems at this time, states he actually feels like a million bucks right now. Informed patient if he had any problems to give [place] a call. Pt verbalizes understanding. Denies any further questions or concerns at this time.

## 2013-06-17 ENCOUNTER — Encounter: Payer: Self-pay | Admitting: Internal Medicine

## 2013-06-17 ENCOUNTER — Telehealth: Payer: Self-pay | Admitting: Internal Medicine

## 2013-06-17 ENCOUNTER — Ambulatory Visit (HOSPITAL_BASED_OUTPATIENT_CLINIC_OR_DEPARTMENT_OTHER): Payer: BC Managed Care – PPO

## 2013-06-17 ENCOUNTER — Other Ambulatory Visit: Payer: Self-pay

## 2013-06-17 ENCOUNTER — Other Ambulatory Visit (HOSPITAL_BASED_OUTPATIENT_CLINIC_OR_DEPARTMENT_OTHER): Payer: BC Managed Care – PPO

## 2013-06-17 ENCOUNTER — Ambulatory Visit (HOSPITAL_BASED_OUTPATIENT_CLINIC_OR_DEPARTMENT_OTHER): Payer: BC Managed Care – PPO | Admitting: Internal Medicine

## 2013-06-17 VITALS — BP 105/64 | HR 88 | Temp 97.8°F | Resp 20

## 2013-06-17 VITALS — BP 121/79 | HR 101 | Resp 18 | Ht 69.0 in | Wt 199.8 lb

## 2013-06-17 DIAGNOSIS — D45 Polycythemia vera: Secondary | ICD-10-CM

## 2013-06-17 DIAGNOSIS — D751 Secondary polycythemia: Secondary | ICD-10-CM

## 2013-06-17 LAB — COMPREHENSIVE METABOLIC PANEL (CC13)
ALT: 17 U/L (ref 0–55)
AST: 21 U/L (ref 5–34)
Albumin: 4.1 g/dL (ref 3.5–5.0)
Alkaline Phosphatase: 62 U/L (ref 40–150)
Anion Gap: 9 mEq/L (ref 3–11)
BUN: 25.1 mg/dL (ref 7.0–26.0)
CO2: 24 mEq/L (ref 22–29)
Calcium: 9.5 mg/dL (ref 8.4–10.4)
Chloride: 109 mEq/L (ref 98–109)
Creatinine: 1.2 mg/dL (ref 0.7–1.3)
Glucose: 150 mg/dl — ABNORMAL HIGH (ref 70–140)
Potassium: 4 mEq/L (ref 3.5–5.1)
Sodium: 142 mEq/L (ref 136–145)
Total Bilirubin: 0.42 mg/dL (ref 0.20–1.20)
Total Protein: 7.3 g/dL (ref 6.4–8.3)

## 2013-06-17 LAB — CBC WITH DIFFERENTIAL/PLATELET
BASO%: 0.2 % (ref 0.0–2.0)
Basophils Absolute: 0 10*3/uL (ref 0.0–0.1)
EOS%: 1.9 % (ref 0.0–7.0)
Eosinophils Absolute: 0.1 10*3/uL (ref 0.0–0.5)
HCT: 51.8 % — ABNORMAL HIGH (ref 38.4–49.9)
HGB: 17.2 g/dL — ABNORMAL HIGH (ref 13.0–17.1)
LYMPH%: 20.1 % (ref 14.0–49.0)
MCH: 29.5 pg (ref 27.2–33.4)
MCHC: 33.2 g/dL (ref 32.0–36.0)
MCV: 89 fL (ref 79.3–98.0)
MONO#: 0.4 10*3/uL (ref 0.1–0.9)
MONO%: 5.9 % (ref 0.0–14.0)
NEUT#: 5.1 10*3/uL (ref 1.5–6.5)
NEUT%: 71.9 % (ref 39.0–75.0)
Platelets: 226 10*3/uL (ref 140–400)
RBC: 5.82 10*6/uL (ref 4.20–5.82)
RDW: 15.6 % — ABNORMAL HIGH (ref 11.0–14.6)
WBC: 7.1 10*3/uL (ref 4.0–10.3)
lymph#: 1.4 10*3/uL (ref 0.9–3.3)

## 2013-06-17 LAB — LACTATE DEHYDROGENASE (CC13): LDH: 196 U/L (ref 125–245)

## 2013-06-17 NOTE — Patient Instructions (Signed)
Therapeutic Phlebotomy Therapeutic phlebotomy is the controlled removal of blood from your body for the purpose of treating a medical condition. It is similar to donating blood. Usually, about a pint (470 mL) of blood is removed. The average adult has 9 to 12 pints (4.3 to 5.7 L) of blood. Therapeutic phlebotomy may be used to treat the following medical conditions:  Hemochromatosis. This is a condition in which there is too much iron in the blood.  Polycythemia vera. This is a condition in which there are too many red cells in the blood.  Porphyria cutanea tarda. This is a disease usually passed from one generation to the next (inherited). It is a condition in which an important part of hemoglobin is not made properly. This results in the build up of abnormal amounts of porphyrins in the body.  Sickle cell disease. This is an inherited disease. It is a condition in which the red blood cells form an abnormal crescent shape rather than a round shape. LET YOUR CAREGIVER KNOW ABOUT:  Allergies.  Medicines taken including herbs, eyedrops, over-the-counter medicines, and creams.  Use of steroids (by mouth or creams).  Previous problems with anesthetics or numbing medicine.  History of blood clots.  History of bleeding or blood problems.  Previous surgery.  Possibility of pregnancy, if this applies. RISKS AND COMPLICATIONS This is a simple and safe procedure. Problems are unlikely. However, problems can occur and may include:  Nausea or lightheadedness.  Low blood pressure.  Soreness, bleeding, swelling, or bruising at the needle insertion site.  Infection. BEFORE THE PROCEDURE  This is a procedure that can be done as an outpatient. Confirm the time that you need to arrive for your procedure. Confirm whether there is a need to fast or withhold any medications. It is helpful to wear clothing with sleeves that can be raised above the elbow. A blood sample may be done to determine the  amount of red blood cells or iron in your blood. Plan ahead of time to have someone drive you home after the procedure. PROCEDURE The entire procedure from preparation through recovery takes about 1 hour. The actual collection takes about 10 to 15 minutes.  A needle will be inserted into your vein.  Tubing and a collection bag will be attached to that needle.  Blood will flow through the needle and tubing into the collection bag.  You may be asked to open and close your hand slowly and continuously during the entire collection.  Once the specified amount of blood has been removed from your body, the collection bag and tubing will be clamped.  The needle will be removed.  Pressure will be held on the site of the needle insertion to stop the bleeding. Then a bandage will be placed over the needle insertion site. AFTER THE PROCEDURE  Your recovery will be assessed and monitored. If there are no problems, as an outpatient, you should be able to go home shortly after the procedure.  Document Released: 07/31/2010 Document Revised: 05/21/2011 Document Reviewed: 07/31/2010 [employer] Patient Information 2014 [employer].  

## 2013-06-17 NOTE — Progress Notes (Signed)
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   PRIOR THERAPY: None   CURRENT THERAPY: Phlebotomy on an as-needed basis noted to keep the hematocrit at 45% or less  INTERVAL HISTORY: James [name] 53 y.o. male returns to the clinic today for followup visit. The patient is feeling fine today with no specific complaints. He denied having any significant fatigue or weakness. He denied having any weight loss or night sweats. He has no chest pain, shortness of breath, cough or hemoptysis. He denied having any significant bleeding issues. The patient continues to use androgen gel 2 tube daily for muscle building and this is supervised by Dr. Risa Grill. He has repeat CBC and comprehensive metabolic panel performed earlier today and he is here for evaluation and discussion of his lab results.  MEDICAL HISTORY: Past Medical History  Diagnosis Date  . Hepatitis C 04/29/2012    ALLERGIES:  has No Known Allergies.  MEDICATIONS:  Current Outpatient Prescriptions  Medication Sig Dispense Refill  . Aspirin-Acetaminophen-Caffeine (GOODY HEADACHE PO) Take by mouth as needed.      . TESTIM 50 MG/5GM GEL       . valACYclovir (VALTREX) 500 MG tablet        No current facility-administered medications for this visit.    REVIEW OF SYSTEMS:  A comprehensive review of systems was negative.   PHYSICAL EXAMINATION: General appearance: alert, cooperative and no distress Head: Normocephalic, without obvious abnormality, atraumatic Neck: no adenopathy, no JVD, supple, symmetrical, trachea midline and thyroid not enlarged, symmetric, no tenderness/mass/nodules Lymph nodes: Cervical, supraclavicular, and axillary nodes normal. Resp: clear to auscultation bilaterally Back: symmetric, no curvature. ROM normal. No CVA  tenderness. Cardio: regular rate and rhythm, S1, S2 normal, no murmur, click, rub or gallop GI: soft, non-tender; bowel sounds normal; no masses,  no organomegaly Extremities: extremities normal, atraumatic, no cyanosis or edema  ECOG PERFORMANCE STATUS: 0 - Asymptomatic  Blood pressure 121/79, pulse 101, resp. rate 18, height 5\' 9"  (1.753 m), weight 199 lb 12.8 oz (90.629 kg).  LABORATORY DATA: Lab Results  Component Value Date   WBC 7.1 06/17/2013   HGB 17.2* 06/17/2013   HCT 51.8* 06/17/2013   MCV 89.0 06/17/2013   PLT 226 06/17/2013      Chemistry      Component Value Date/Time   NA 140 03/25/2013 1402   NA 138 08/22/2011 0923   K 4.2 03/25/2013 1402   K 4.1 08/22/2011 0923   CL 106 08/06/2012 1536   CL 107 08/22/2011 0923   CO2 24 03/25/2013 1402   CO2 23 08/22/2011 0923   BUN 28.3* 03/25/2013 1402   BUN 22 08/22/2011 0923   CREATININE 1.2 03/25/2013 1402   CREATININE 1.08 08/22/2011 0923      Component Value Date/Time   CALCIUM 9.0 03/25/2013 1402   CALCIUM 9.1 08/22/2011 0923   ALKPHOS 62 03/25/2013 1402   ALKPHOS 51 08/22/2011 0923   AST 19 03/25/2013 1402   AST 27 08/22/2011 0923   ALT 23 03/25/2013 1402   ALT 33 08/22/2011 0923   BILITOT 0.30 03/25/2013 1402   BILITOT 0.5 08/22/2011 0923       RADIOGRAPHIC STUDIES: No results found.  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 The patient is doing fine today. His hematocrit is elevated today to 51.8%. He would like to proceed with phlebotomy today as scheduled. I was him back for followup visit in 2 months with repeat CBC and comprehensive metabolic panel and phlebotomy as needed. He was advised to call immediately if he has any concerning symptoms in the interval. The patient voices understanding of current disease status and treatment options and is in  agreement with the current care plan.  All questions were answered. The patient knows to call the clinic with any problems, questions or concerns. We can certainly see the patient much sooner if necessary.  Disclaimer: This note was dictated with voice recognition software. Similar sounding words can inadvertently be transcribed and may not be corrected upon review.

## 2013-06-17 NOTE — Patient Instructions (Signed)
Smoking Cessation, Tips for Success If you are ready to quit smoking, congratulations! You have chosen to help yourself be healthier. Cigarettes bring nicotine, tar, carbon monoxide, and other irritants into your body. Your lungs, heart, and blood vessels will be able to work better without these poisons. There are many different ways to quit smoking. Nicotine gum, nicotine patches, a nicotine inhaler, or nicotine nasal spray can help with physical craving. Hypnosis, support groups, and medicines help break the habit of smoking. WHAT THINGS CAN I DO TO MAKE QUITTING EASIER?  Here are some tips to help you quit for good:  Pick a date when you will quit smoking completely. Tell all of your friends and family about your plan to quit on that date.  Do not try to slowly cut down on the number of cigarettes you are smoking. Pick a quit date and quit smoking completely starting on that day.  Throw away all cigarettes.   Clean and remove all ashtrays from your home, work, and car.   On a card, write down your reasons for quitting. Carry the card with you and read it when you get the urge to smoke.   Cleanse your body of nicotine. Drink enough water and fluids to keep your urine clear or pale yellow. Do this after quitting to flush the nicotine from your body.   Learn to predict your moods. Do not let a bad situation be your excuse to have a cigarette. Some situations in your life might tempt you into wanting a cigarette.   Never have "just one" cigarette. It leads to wanting another and another. Remind yourself of your decision to quit.   Change habits associated with smoking. If you smoked while driving or when feeling stressed, try other activities to replace smoking. Stand up when drinking your coffee. Brush your teeth after eating. Sit in a different chair when you read the paper. Avoid alcohol while trying to quit, and try to drink fewer caffeinated beverages. Alcohol and caffeine may urge  you to smoke.   Avoid foods and drinks that can trigger a desire to smoke, such as sugary or spicy foods and alcohol.   Ask people who smoke not to smoke around you.   Have something planned to do right after eating or having a cup of coffee. For example, plan to take a walk or exercise.   Try a relaxation exercise to calm you down and decrease your stress. Remember, you may be tense and nervous for the first 2 weeks after you quit, but this will pass.   Find new activities to keep your hands busy. Play with a pen, coin, or rubber band. Doodle or draw things on paper.   Brush your teeth right after eating. This will help cut down on the craving for the taste of tobacco after meals. You can also try mouthwash.   Use oral substitutes in place of cigarettes. Try using lemon drops, carrots, cinnamon sticks, or chewing gum. Keep them handy so they are available when you have the urge to smoke.   When you have the urge to smoke, try deep breathing.   Designate your home as a nonsmoking area.   If you are a heavy smoker, ask your health care provider about a prescription for nicotine chewing gum. It can ease your withdrawal from nicotine.   Reward yourself. Set aside the cigarette money you save and buy yourself something nice.   Look for support from others. Join a support group or   smoking cessation program. Ask someone at home or at work to help you with your plan to quit smoking.   Always ask yourself, "Do I need this cigarette or is this just a reflex?" Tell yourself, "Today, I choose not to smoke," or "I do not want to smoke." You are reminding yourself of your decision to quit.  Do not replace cigarette smoking with electronic cigarettes (commonly called e-cigarettes). The safety of e-cigarettes is unknown, and some may contain harmful chemicals.  If you relapse, do not give up! Plan ahead and think about what you will do the next time you get the urge to smoke.  HOW WILL  I FEEL WHEN I QUIT SMOKING? You may have symptoms of withdrawal because your body is used to nicotine (the addictive substance in cigarettes). You may crave cigarettes, be irritable, feel very hungry, cough often, get headaches, or have difficulty concentrating. The withdrawal symptoms are only temporary. They are strongest when you first quit but will go away within 10 14 days. When withdrawal symptoms occur, stay in control. Think about your reasons for quitting. Remind yourself that these are signs that your body is healing and getting used to being without cigarettes. Remember that withdrawal symptoms are easier to treat than the major diseases that smoking can cause.  Even after the withdrawal is over, expect periodic urges to smoke. However, these cravings are generally short lived and will go away whether you smoke or not. Do not smoke!  WHAT RESOURCES ARE AVAILABLE TO HELP ME QUIT SMOKING? Your health care provider can direct you to community resources or hospitals for support, which may include:  Group support.  Education.  Hypnosis.  Therapy. Document Released: 11/25/2003 Document Revised: 12/17/2012 Document Reviewed: 08/14/2012 [employer] Patient Information 2014 [employer].  

## 2013-06-17 NOTE — Progress Notes (Signed)
Phlebotomy performed from [phone], using 18G phlebotomy set to left [facility] without difficulty. 500 units removed. Snacks/drink provided after procedure. Phlebotomy tolerated well. 30 min observation completed at 1655.

## 2013-06-17 NOTE — Telephone Encounter (Signed)
gv and printed appt sched and avs for pt for June...sedc added tx.

## 2013-08-12 ENCOUNTER — Ambulatory Visit (HOSPITAL_BASED_OUTPATIENT_CLINIC_OR_DEPARTMENT_OTHER): Payer: BC Managed Care – PPO

## 2013-08-12 ENCOUNTER — Other Ambulatory Visit (HOSPITAL_BASED_OUTPATIENT_CLINIC_OR_DEPARTMENT_OTHER): Payer: BC Managed Care – PPO

## 2013-08-12 ENCOUNTER — Encounter: Payer: Self-pay | Admitting: Internal Medicine

## 2013-08-12 ENCOUNTER — Telehealth: Payer: Self-pay | Admitting: Internal Medicine

## 2013-08-12 ENCOUNTER — Ambulatory Visit (HOSPITAL_BASED_OUTPATIENT_CLINIC_OR_DEPARTMENT_OTHER): Payer: BC Managed Care – PPO | Admitting: Internal Medicine

## 2013-08-12 VITALS — BP 130/78 | HR 78 | Temp 98.5°F | Resp 18 | Ht 69.0 in | Wt 199.0 lb

## 2013-08-12 DIAGNOSIS — D751 Secondary polycythemia: Secondary | ICD-10-CM

## 2013-08-12 DIAGNOSIS — D45 Polycythemia vera: Secondary | ICD-10-CM

## 2013-08-12 LAB — COMPREHENSIVE METABOLIC PANEL (CC13)
ALT: 20 U/L (ref 0–55)
AST: 17 U/L (ref 5–34)
Albumin: 3.6 g/dL (ref 3.5–5.0)
Alkaline Phosphatase: 63 U/L (ref 40–150)
Anion Gap: 13 mEq/L — ABNORMAL HIGH (ref 3–11)
BUN: 25.7 mg/dL (ref 7.0–26.0)
CO2: 19 mEq/L — ABNORMAL LOW (ref 22–29)
Calcium: 9 mg/dL (ref 8.4–10.4)
Chloride: 108 mEq/L (ref 98–109)
Creatinine: 1.3 mg/dL (ref 0.7–1.3)
Glucose: 99 mg/dl (ref 70–140)
Potassium: 4.2 mEq/L (ref 3.5–5.1)
Sodium: 141 mEq/L (ref 136–145)
Total Bilirubin: 0.33 mg/dL (ref 0.20–1.20)
Total Protein: 7 g/dL (ref 6.4–8.3)

## 2013-08-12 LAB — CBC WITH DIFFERENTIAL/PLATELET
BASO%: 0.7 % (ref 0.0–2.0)
Basophils Absolute: 0.1 10*3/uL (ref 0.0–0.1)
EOS%: 2.3 % (ref 0.0–7.0)
Eosinophils Absolute: 0.2 10*3/uL (ref 0.0–0.5)
HCT: 49.8 % (ref 38.4–49.9)
HGB: 16.6 g/dL (ref 13.0–17.1)
LYMPH%: 21.8 % (ref 14.0–49.0)
MCH: 29.8 pg (ref 27.2–33.4)
MCHC: 33.3 g/dL (ref 32.0–36.0)
MCV: 89.6 fL (ref 79.3–98.0)
MONO#: 0.6 10*3/uL (ref 0.1–0.9)
MONO%: 8.6 % (ref 0.0–14.0)
NEUT#: 4.9 10*3/uL (ref 1.5–6.5)
NEUT%: 66.6 % (ref 39.0–75.0)
Platelets: 193 10*3/uL (ref 140–400)
RBC: 5.56 10*6/uL (ref 4.20–5.82)
RDW: 14.1 % (ref 11.0–14.6)
WBC: 7.4 10*3/uL (ref 4.0–10.3)
lymph#: 1.6 10*3/uL (ref 0.9–3.3)

## 2013-08-12 NOTE — Progress Notes (Signed)
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   PRIOR THERAPY: None   CURRENT THERAPY: Phlebotomy on an as-needed basis noted to keep the hematocrit at 45% or less  INTERVAL HISTORY: James [name] 53 y.o. male returns to the clinic today for followup visit. The patient is feeling fine today with no specific complaints except for occasional heart flutter. He was seen by his primary care physician and EKG in addition to be 2-D echo was performed and according to the patient would unremarkable. He denied having any significant fatigue or weakness. He denied having any weight loss or night sweats. He has no chest pain, shortness of breath, cough or hemoptysis. He denied having any significant bleeding issues. The patient continues to use androgen gel 2 tube daily for muscle building and this is supervised by Dr. Risa Grill. He has repeat CBC and comprehensive metabolic panel performed earlier today and he is here for evaluation and discussion of his lab results.  MEDICAL HISTORY: Past Medical History  Diagnosis Date  . Hepatitis C 04/29/2012    ALLERGIES:  has No Known Allergies.  MEDICATIONS:  Current Outpatient Prescriptions  Medication Sig Dispense Refill  . Aspirin-Acetaminophen-Caffeine (GOODY HEADACHE PO) Take by mouth as needed.      . TESTIM 50 MG/5GM GEL       . valACYclovir (VALTREX) 500 MG tablet        No current facility-administered medications for this visit.    REVIEW OF SYSTEMS:  A comprehensive review of systems was negative.   PHYSICAL EXAMINATION: General appearance: alert, cooperative and no distress Head: Normocephalic, without obvious abnormality, atraumatic Neck: no adenopathy, no JVD, supple, symmetrical, trachea midline and thyroid not enlarged, symmetric, no  tenderness/mass/nodules Lymph nodes: Cervical, supraclavicular, and axillary nodes normal. Resp: clear to auscultation bilaterally Back: symmetric, no curvature. ROM normal. No CVA tenderness. Cardio: regular rate and rhythm, S1, S2 normal, no murmur, click, rub or gallop GI: soft, non-tender; bowel sounds normal; no masses,  no organomegaly Extremities: extremities normal, atraumatic, no cyanosis or edema  ECOG PERFORMANCE STATUS: 0 - Asymptomatic  Blood pressure 130/78, pulse 78, temperature 98.5 F (36.9 C), temperature source Oral, resp. rate 18, height 5\' 9"  (1.753 m), weight 199 lb (90.266 kg), SpO2 97.00%.  LABORATORY DATA: Lab Results  Component Value Date   WBC 7.4 08/12/2013   HGB 16.6 08/12/2013   HCT 49.8 08/12/2013   MCV 89.6 08/12/2013   PLT 193 08/12/2013      Chemistry      Component Value Date/Time   NA 141 08/12/2013 1505   NA 138 08/22/2011 0923   K 4.2 08/12/2013 1505   K 4.1 08/22/2011 0923   CL 106 08/06/2012 1536   CL 107 08/22/2011 0923   CO2 19* 08/12/2013 1505   CO2 23 08/22/2011 0923   BUN 25.7 08/12/2013 1505   BUN 22 08/22/2011 0923   CREATININE 1.3 08/12/2013 1505   CREATININE 1.08 08/22/2011 0923      Component Value Date/Time   CALCIUM 9.0 08/12/2013 1505   CALCIUM 9.1 08/22/2011 0923   ALKPHOS 63 08/12/2013 1505   ALKPHOS 51 08/22/2011 0923   AST 17 08/12/2013 1505   AST 27 08/22/2011 0923   ALT 20 08/12/2013 1505   ALT 33 08/22/2011 0923   BILITOT 0.33 08/12/2013 1505  BILITOT 0.5 08/22/2011 0923       RADIOGRAPHIC STUDIES: No results found.  ASSESSMENT AND PLAN: This is a very pleasant 53 years old white male with reactive polycythemia secondary to hormonal treatment and currently on treatment with phlebotomy as needed basis to keep his hematocrit close to 45%. The patient is doing fine today. His hematocrit is elevated today to 49.8%. He would like to proceed with phlebotomy today as scheduled. I was him back for followup visit in 2 months with repeat CBC and  comprehensive metabolic panel and phlebotomy as needed. I was advised the patient to see a cardiologist if he continues to have cardiac palpitation. He was advised to call immediately if he has any concerning symptoms in the interval. The patient voices understanding of current disease status and treatment options and is in agreement with the current care plan.  All questions were answered. The patient knows to call the clinic with any problems, questions or concerns. We can certainly see the patient much sooner if necessary.  Disclaimer: This note was dictated with voice recognition software. Similar sounding words can inadvertently be transcribed and may not be corrected upon review.

## 2013-08-12 NOTE — Patient Instructions (Signed)
You Can Quit Smoking If you are ready to quit smoking or are thinking about it, congratulations! You have chosen to help yourself be healthier and live longer! There are lots of different ways to quit smoking. Nicotine gum, nicotine patches, a nicotine inhaler, or nicotine nasal spray can help with physical craving. Hypnosis, support groups, and medicines help break the habit of smoking. TIPS TO GET OFF AND STAY OFF CIGARETTES  Learn to predict your moods. Do not let a bad situation be your excuse to have a cigarette. Some situations in your life might tempt you to have a cigarette.  Ask friends and co-workers not to smoke around you.  Make your home smoke-free.  Never have "just one" cigarette. It leads to wanting another and another. Remind yourself of your decision to quit.  On a card, make a list of your reasons for not smoking. Read it at least the same number of times a day as you have a cigarette. Tell yourself everyday, "I do not want to smoke. I choose not to smoke."  Ask someone at home or work to help you with your plan to quit smoking.  Have something planned after you eat or have a cup of coffee. Take a walk or get other exercise to perk you up. This will help to keep you from overeating.  Try a relaxation exercise to calm you down and decrease your stress. Remember, you may be tense and nervous the first two weeks after you quit. This will pass.  Find new activities to keep your hands busy. Play with a pen, coin, or rubber band. Doodle or draw things on paper.  Brush your teeth right after eating. This will help cut down the craving for the taste of tobacco after meals. You can try mouthwash too.  Try gum, breath mints, or diet candy to keep something in your mouth. IF YOU SMOKE AND WANT TO QUIT:  Do not stock up on cigarettes. Never buy a carton. Wait until one pack is finished before you buy another.  Never carry cigarettes with you at work or at home.  Keep cigarettes  as far away from you as possible. Leave them with someone else.  Never carry matches or a lighter with you.  Ask yourself, "Do I need this cigarette or is this just a reflex?"  Bet with someone that you can quit. Put cigarette money in a piggy bank every morning. If you smoke, you give up the money. If you do not smoke, by the end of the week, you keep the money.  Keep trying. It takes 21 days to change a habit!  Talk to your doctor about using medicines to help you quit. These include nicotine replacement gum, lozenges, or skin patches. Document Released: 12/23/2008 Document Revised: 05/21/2011 Document Reviewed: 12/23/2008 [employer] Patient Information 2014 [employer].  

## 2013-08-12 NOTE — Telephone Encounter (Signed)
sent email to MW to sch Therapeutic Phlebotomy-adv pt that i would call w/appt time

## 2013-08-12 NOTE — Progress Notes (Signed)
Phlebotomy procedure done. 500 ml removed through 16G phlebotomy kit in right AC. Patient tolerated well. Provided snacks and drinks after procedure.

## 2013-08-12 NOTE — Patient Instructions (Signed)
Therapeutic Phlebotomy Therapeutic phlebotomy is the controlled removal of blood from your body for the purpose of treating a medical condition. It is similar to donating blood. Usually, about a pint (470 mL) of blood is removed. The average adult has 9 to 12 pints (4.3 to 5.7 L) of blood. Therapeutic phlebotomy may be used to treat the following medical conditions:  Hemochromatosis. This is a condition in which there is too much iron in the blood.  Polycythemia vera. This is a condition in which there are too many red cells in the blood.  Porphyria cutanea tarda. This is a disease usually passed from one generation to the next (inherited). It is a condition in which an important part of hemoglobin is not made properly. This results in the build up of abnormal amounts of porphyrins in the body.  Sickle cell disease. This is an inherited disease. It is a condition in which the red blood cells form an abnormal crescent shape rather than a round shape. LET YOUR CAREGIVER KNOW ABOUT:  Allergies.  Medicines taken including herbs, eyedrops, over-the-counter medicines, and creams.  Use of steroids (by mouth or creams).  Previous problems with anesthetics or numbing medicine.  History of blood clots.  History of bleeding or blood problems.  Previous surgery.  Possibility of pregnancy, if this applies. RISKS AND COMPLICATIONS This is a simple and safe procedure. Problems are unlikely. However, problems can occur and may include:  Nausea or lightheadedness.  Low blood pressure.  Soreness, bleeding, swelling, or bruising at the needle insertion site.  Infection. BEFORE THE PROCEDURE  This is a procedure that can be done as an outpatient. Confirm the time that you need to arrive for your procedure. Confirm whether there is a need to fast or withhold any medications. It is helpful to wear clothing with sleeves that can be raised above the elbow. A blood sample may be done to determine the  amount of red blood cells or iron in your blood. Plan ahead of time to have someone drive you home after the procedure. PROCEDURE The entire procedure from preparation through recovery takes about 1 hour. The actual collection takes about 10 to 15 minutes.  A needle will be inserted into your vein.  Tubing and a collection bag will be attached to that needle.  Blood will flow through the needle and tubing into the collection bag.  You may be asked to open and close your hand slowly and continuously during the entire collection.  Once the specified amount of blood has been removed from your body, the collection bag and tubing will be clamped.  The needle will be removed.  Pressure will be held on the site of the needle insertion to stop the bleeding. Then a bandage will be placed over the needle insertion site. AFTER THE PROCEDURE  Your recovery will be assessed and monitored. If there are no problems, as an outpatient, you should be able to go home shortly after the procedure.  Document Released: 07/31/2010 Document Revised: 05/21/2011 Document Reviewed: 07/31/2010 [employer] Patient Information 2014 [employer].  

## 2013-08-12 NOTE — Telephone Encounter (Signed)
per pof to sch appt-gave pt copy of sch °

## 2013-08-13 ENCOUNTER — Telehealth: Payer: Self-pay | Admitting: *Deleted

## 2013-08-13 NOTE — Telephone Encounter (Signed)
Per staff message and POF I have scheduled appts.  JMW  

## 2013-08-18 ENCOUNTER — Telehealth: Payer: Self-pay | Admitting: Internal Medicine

## 2013-08-18 NOTE — Telephone Encounter (Signed)
per pof to sch appt-cld pt to give time & date of appt in Aug

## 2013-10-28 ENCOUNTER — Ambulatory Visit (HOSPITAL_BASED_OUTPATIENT_CLINIC_OR_DEPARTMENT_OTHER): Payer: BC Managed Care – PPO | Admitting: Internal Medicine

## 2013-10-28 ENCOUNTER — Other Ambulatory Visit (HOSPITAL_BASED_OUTPATIENT_CLINIC_OR_DEPARTMENT_OTHER): Payer: BC Managed Care – PPO

## 2013-10-28 ENCOUNTER — Encounter: Payer: Self-pay | Admitting: Internal Medicine

## 2013-10-28 ENCOUNTER — Telehealth: Payer: Self-pay | Admitting: Internal Medicine

## 2013-10-28 ENCOUNTER — Ambulatory Visit (HOSPITAL_BASED_OUTPATIENT_CLINIC_OR_DEPARTMENT_OTHER): Payer: BC Managed Care – PPO

## 2013-10-28 VITALS — BP 119/66 | HR 70

## 2013-10-28 VITALS — BP 127/85 | HR 75 | Temp 98.1°F | Resp 20 | Ht 69.0 in | Wt 196.7 lb

## 2013-10-28 DIAGNOSIS — D751 Secondary polycythemia: Secondary | ICD-10-CM

## 2013-10-28 LAB — CBC WITH DIFFERENTIAL/PLATELET
BASO%: 0.8 % (ref 0.0–2.0)
Basophils Absolute: 0.1 10*3/uL (ref 0.0–0.1)
EOS%: 2.2 % (ref 0.0–7.0)
Eosinophils Absolute: 0.2 10*3/uL (ref 0.0–0.5)
HCT: 50.9 % — ABNORMAL HIGH (ref 38.4–49.9)
HGB: 16.6 g/dL (ref 13.0–17.1)
LYMPH%: 20.3 % (ref 14.0–49.0)
MCH: 29.7 pg (ref 27.2–33.4)
MCHC: 32.7 g/dL (ref 32.0–36.0)
MCV: 91 fL (ref 79.3–98.0)
MONO#: 0.5 10*3/uL (ref 0.1–0.9)
MONO%: 6.3 % (ref 0.0–14.0)
NEUT#: 5.7 10*3/uL (ref 1.5–6.5)
NEUT%: 70.4 % (ref 39.0–75.0)
Platelets: 231 10*3/uL (ref 140–400)
RBC: 5.59 10*6/uL (ref 4.20–5.82)
RDW: 14.3 % (ref 11.0–14.6)
WBC: 8.1 10*3/uL (ref 4.0–10.3)
lymph#: 1.7 10*3/uL (ref 0.9–3.3)

## 2013-10-28 LAB — COMPREHENSIVE METABOLIC PANEL (CC13)
ALT: 17 U/L (ref 0–55)
AST: 17 U/L (ref 5–34)
Albumin: 3.7 g/dL (ref 3.5–5.0)
Alkaline Phosphatase: 58 U/L (ref 40–150)
Anion Gap: 7 mEq/L (ref 3–11)
BUN: 29.4 mg/dL — ABNORMAL HIGH (ref 7.0–26.0)
CO2: 24 mEq/L (ref 22–29)
Calcium: 9.2 mg/dL (ref 8.4–10.4)
Chloride: 108 mEq/L (ref 98–109)
Creatinine: 1.2 mg/dL (ref 0.7–1.3)
Glucose: 103 mg/dl (ref 70–140)
Potassium: 4.3 mEq/L (ref 3.5–5.1)
Sodium: 140 mEq/L (ref 136–145)
Total Bilirubin: 0.3 mg/dL (ref 0.20–1.20)
Total Protein: 6.9 g/dL (ref 6.4–8.3)

## 2013-10-28 NOTE — Telephone Encounter (Signed)
gv and printed appt sched and avs for pt for OCT..sed added tx. °

## 2013-10-28 NOTE — Progress Notes (Signed)
Post VSS, Patient discharged after 30 minute observation, with no complaints. Ambulating and AVS provided.

## 2013-10-28 NOTE — Patient Instructions (Signed)

## 2013-10-28 NOTE — Progress Notes (Signed)
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   PRIOR THERAPY: None   CURRENT THERAPY: Phlebotomy on an as-needed basis noted to keep the hematocrit at 45% or less. He is scheduled for one today.  INTERVAL HISTORY: James [name] 53 y.o. male returns to the clinic today for followup visit. The patient is feeling fine today with no specific complaints. He denied having any significant fatigue or weakness. He denied having any weight loss or night sweats. He has no chest pain, shortness of breath, cough or hemoptysis. He denied having any significant bleeding issues. The patient continues to use androgen gel 2 tube daily for muscle building and this is supervised by Dr. Risa Grill and no change in this medication. He has repeat CBC and comprehensive metabolic panel performed earlier today and he is here for evaluation and discussion of his lab results.  MEDICAL HISTORY: Past Medical History  Diagnosis Date  . Hepatitis C 04/29/2012    ALLERGIES:  has No Known Allergies.  MEDICATIONS:  Current Outpatient Prescriptions  Medication Sig Dispense Refill  . Aspirin-Acetaminophen-Caffeine (GOODY HEADACHE PO) Take by mouth as needed.      . TESTIM 50 MG/5GM GEL       . valACYclovir (VALTREX) 500 MG tablet        No current facility-administered medications for this visit.    REVIEW OF SYSTEMS:  A comprehensive review of systems was negative.   PHYSICAL EXAMINATION: General appearance: alert, cooperative and no distress Head: Normocephalic, without obvious abnormality, atraumatic Neck: no adenopathy, no JVD, supple, symmetrical, trachea midline and thyroid not enlarged, symmetric, no tenderness/mass/nodules Lymph nodes: Cervical, supraclavicular, and axillary nodes normal. Resp: clear to auscultation  bilaterally Back: symmetric, no curvature. ROM normal. No CVA tenderness. Cardio: regular rate and rhythm, S1, S2 normal, no murmur, click, rub or gallop GI: soft, non-tender; bowel sounds normal; no masses,  no organomegaly Extremities: extremities normal, atraumatic, no cyanosis or edema  ECOG PERFORMANCE STATUS: 0 - Asymptomatic  There were no vitals taken for this visit.  LABORATORY DATA: Lab Results  Component Value Date   WBC 8.1 10/28/2013   HGB 16.6 10/28/2013   HCT 50.9* 10/28/2013   MCV 91.0 10/28/2013   PLT 231 10/28/2013      Chemistry      Component Value Date/Time   NA 141 08/12/2013 1505   NA 138 08/22/2011 0923   K 4.2 08/12/2013 1505   K 4.1 08/22/2011 0923   CL 106 08/06/2012 1536   CL 107 08/22/2011 0923   CO2 19* 08/12/2013 1505   CO2 23 08/22/2011 0923   BUN 25.7 08/12/2013 1505   BUN 22 08/22/2011 0923   CREATININE 1.3 08/12/2013 1505   CREATININE 1.08 08/22/2011 0923      Component Value Date/Time   CALCIUM 9.0 08/12/2013 1505   CALCIUM 9.1 08/22/2011 0923   ALKPHOS 63 08/12/2013 1505   ALKPHOS 51 08/22/2011 0923   AST 17 08/12/2013 1505   AST 27 08/22/2011 0923   ALT 20 08/12/2013 1505   ALT 33 08/22/2011 0923   BILITOT 0.33 08/12/2013 1505   BILITOT 0.5 08/22/2011 0923       RADIOGRAPHIC STUDIES: No results found.  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 The patient is doing fine today. His hematocrit is elevated today to 50.9%. He would like to proceed with phlebotomy today as scheduled. I was him back for followup visit in 2 months with repeat CBC and comprehensive metabolic panel and phlebotomy as needed. He was advised to call immediately if he has any concerning symptoms in the interval. The patient voices understanding of current disease status and treatment options and is in  agreement with the current care plan.  All questions were answered. The patient knows to call the clinic with any problems, questions or concerns. We can certainly see the patient much sooner if necessary.  Disclaimer: This note was dictated with voice recognition software. Similar sounding words can inadvertently be transcribed and may not be corrected upon review.

## 2013-12-23 ENCOUNTER — Telehealth: Payer: Self-pay | Admitting: *Deleted

## 2013-12-23 ENCOUNTER — Encounter: Payer: Self-pay | Admitting: Internal Medicine

## 2013-12-23 ENCOUNTER — Telehealth: Payer: Self-pay | Admitting: Internal Medicine

## 2013-12-23 ENCOUNTER — Other Ambulatory Visit (HOSPITAL_BASED_OUTPATIENT_CLINIC_OR_DEPARTMENT_OTHER): Payer: BC Managed Care – PPO

## 2013-12-23 ENCOUNTER — Ambulatory Visit (HOSPITAL_BASED_OUTPATIENT_CLINIC_OR_DEPARTMENT_OTHER): Payer: BC Managed Care – PPO | Admitting: Internal Medicine

## 2013-12-23 ENCOUNTER — Ambulatory Visit (HOSPITAL_BASED_OUTPATIENT_CLINIC_OR_DEPARTMENT_OTHER): Payer: BC Managed Care – PPO

## 2013-12-23 VITALS — BP 136/68 | HR 84 | Temp 98.2°F | Resp 18

## 2013-12-23 VITALS — BP 127/80 | HR 96 | Temp 98.4°F | Resp 18 | Ht 69.0 in | Wt 201.5 lb

## 2013-12-23 DIAGNOSIS — D751 Secondary polycythemia: Secondary | ICD-10-CM

## 2013-12-23 LAB — CBC WITH DIFFERENTIAL/PLATELET
BASO%: 0.5 % (ref 0.0–2.0)
Basophils Absolute: 0 10*3/uL (ref 0.0–0.1)
EOS%: 2.8 % (ref 0.0–7.0)
Eosinophils Absolute: 0.2 10*3/uL (ref 0.0–0.5)
HCT: 49.6 % (ref 38.4–49.9)
HGB: 16.5 g/dL (ref 13.0–17.1)
LYMPH%: 21.3 % (ref 14.0–49.0)
MCH: 30.3 pg (ref 27.2–33.4)
MCHC: 33.2 g/dL (ref 32.0–36.0)
MCV: 91.3 fL (ref 79.3–98.0)
MONO#: 0.7 10*3/uL (ref 0.1–0.9)
MONO%: 8.2 % (ref 0.0–14.0)
NEUT#: 5.4 10*3/uL (ref 1.5–6.5)
NEUT%: 67.2 % (ref 39.0–75.0)
Platelets: 210 10*3/uL (ref 140–400)
RBC: 5.44 10*6/uL (ref 4.20–5.82)
RDW: 14.3 % (ref 11.0–14.6)
WBC: 8 10*3/uL (ref 4.0–10.3)
lymph#: 1.7 10*3/uL (ref 0.9–3.3)

## 2013-12-23 LAB — COMPREHENSIVE METABOLIC PANEL (CC13)
ALT: 19 U/L (ref 0–55)
AST: 18 U/L (ref 5–34)
Albumin: 3.6 g/dL (ref 3.5–5.0)
Alkaline Phosphatase: 63 U/L (ref 40–150)
Anion Gap: 8 mEq/L (ref 3–11)
BUN: 29.3 mg/dL — ABNORMAL HIGH (ref 7.0–26.0)
CO2: 23 mEq/L (ref 22–29)
Calcium: 9.4 mg/dL (ref 8.4–10.4)
Chloride: 110 mEq/L — ABNORMAL HIGH (ref 98–109)
Creatinine: 1.2 mg/dL (ref 0.7–1.3)
Glucose: 95 mg/dl (ref 70–140)
Potassium: 4.4 mEq/L (ref 3.5–5.1)
Sodium: 141 mEq/L (ref 136–145)
Total Bilirubin: 0.45 mg/dL (ref 0.20–1.20)
Total Protein: 7 g/dL (ref 6.4–8.3)

## 2013-12-23 NOTE — Telephone Encounter (Signed)
Per staff message and POF I have scheduled appts. Advised scheduler of appts. JMW  

## 2013-12-23 NOTE — Telephone Encounter (Signed)
Pt confirmed labs/ov per 10/14 POF, sent msg to add Phlebotomy, gave pt AVS.... KJ

## 2013-12-23 NOTE — Progress Notes (Signed)
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   PRIOR THERAPY: None   CURRENT THERAPY: Phlebotomy on an as-needed basis noted to keep the hematocrit at 45% or less. He is scheduled for one today.  INTERVAL HISTORY: James [name] 53 y.o. male returns to the clinic today for followup visit. He has no complaints today. He denied having any significant fatigue or weakness. He denied having any weight loss or night sweats. He has no chest pain, shortness of breath, cough or hemoptysis. He denied having any significant bleeding issues. The patient continues to use androgen gel 2 tube daily for muscle building and this is supervised by Dr. Risa Grill and no change in this medication. He has repeat CBC and comprehensive metabolic panel performed earlier today and he is here for evaluation and discussion of his lab results.  MEDICAL HISTORY: Past Medical History  Diagnosis Date  . Hepatitis C 04/29/2012    ALLERGIES:  has No Known Allergies.  MEDICATIONS:  Current Outpatient Prescriptions  Medication Sig Dispense Refill  . aspirin 325 MG tablet Take 325 mg by mouth 2 (two) times daily.      . TESTIM 50 MG/5GM GEL       . valACYclovir (VALTREX) 500 MG tablet       . Aspirin-Acetaminophen-Caffeine (GOODY HEADACHE PO) Take by mouth as needed.       No current facility-administered medications for this visit.    REVIEW OF SYSTEMS:  A comprehensive review of systems was negative.   PHYSICAL EXAMINATION: General appearance: alert, cooperative and no distress Head: Normocephalic, without obvious abnormality, atraumatic Neck: no adenopathy, no JVD, supple, symmetrical, trachea midline and thyroid not enlarged, symmetric, no tenderness/mass/nodules Lymph nodes: Cervical, supraclavicular, and axillary nodes  normal. Resp: clear to auscultation bilaterally Back: symmetric, no curvature. ROM normal. No CVA tenderness. Cardio: regular rate and rhythm, S1, S2 normal, no murmur, click, rub or gallop GI: soft, non-tender; bowel sounds normal; no masses,  no organomegaly Extremities: extremities normal, atraumatic, no cyanosis or edema  ECOG PERFORMANCE STATUS: 0 - Asymptomatic  Blood pressure 127/80, pulse 96, temperature 98.4 F (36.9 C), temperature source Oral, resp. rate 18, height 5\' 9"  (1.753 m), weight 201 lb 8 oz (91.4 kg), SpO2 98.00%.  LABORATORY DATA: Lab Results  Component Value Date   WBC 8.0 12/23/2013   HGB 16.5 12/23/2013   HCT 49.6 12/23/2013   MCV 91.3 12/23/2013   PLT 210 12/23/2013      Chemistry      Component Value Date/Time   NA 141 12/23/2013 1404   NA 138 08/22/2011 0923   K 4.4 12/23/2013 1404   K 4.1 08/22/2011 0923   CL 106 08/06/2012 1536   CL 107 08/22/2011 0923   CO2 23 12/23/2013 1404   CO2 23 08/22/2011 0923   BUN 29.3* 12/23/2013 1404   BUN 22 08/22/2011 0923   CREATININE 1.2 12/23/2013 1404   CREATININE 1.08 08/22/2011 0923      Component Value Date/Time   CALCIUM 9.4 12/23/2013 1404   CALCIUM 9.1 08/22/2011 0923   ALKPHOS 63 12/23/2013 1404   ALKPHOS 51 08/22/2011 0923   AST 18 12/23/2013 1404   AST 27 08/22/2011 0923   ALT 19 12/23/2013 1404   ALT 33 08/22/2011 0923   BILITOT 0.45 12/23/2013 1404   BILITOT  0.5 08/22/2011 0923       RADIOGRAPHIC STUDIES: No results found.  ASSESSMENT AND PLAN: This is a very pleasant 53 years old white male with reactive polycythemia secondary to hormonal treatment and currently on treatment with phlebotomy as needed basis to keep his hematocrit close to 45%. The patient is doing fine today. His hematocrit is elevated today to 49.6%. I will arrange for the patient to have phlebotomy today. I also encouraged him to decrease the dose of androgen gel if possible. He will try 1 1/2 tube instead of 2. I was him  back for followup visit in 2 months with repeat CBC and comprehensive metabolic panel and phlebotomy as needed. He was advised to call immediately if he has any concerning symptoms in the interval. The patient voices understanding of current disease status and treatment options and is in agreement with the current care plan.  All questions were answered. The patient knows to call the clinic with any problems, questions or concerns. We can certainly see the patient much sooner if necessary.  Disclaimer: This note was dictated with voice recognition software. Similar sounding words can inadvertently be transcribed and may not be corrected upon review.

## 2013-12-23 NOTE — Progress Notes (Signed)
Pt states he has not eaten recently. Pt provided with sandwhich, chips, and coke to drink per request prior to start of phlebotomy.  1550: Therapeutic phlebotomy performed per order. 1 unit of blood removed from pt's left AC over 10 minutes without difficulty. Coke provided for patient, refused any other snacks.  Will continue to monitor patient.  1620: Pt ambulatory at discharge with no complaints, vital signs stable.

## 2013-12-23 NOTE — Patient Instructions (Signed)

## 2014-02-23 ENCOUNTER — Other Ambulatory Visit (HOSPITAL_BASED_OUTPATIENT_CLINIC_OR_DEPARTMENT_OTHER): Payer: BC Managed Care – PPO

## 2014-02-23 ENCOUNTER — Ambulatory Visit (HOSPITAL_BASED_OUTPATIENT_CLINIC_OR_DEPARTMENT_OTHER): Payer: BC Managed Care – PPO | Admitting: Internal Medicine

## 2014-02-23 ENCOUNTER — Ambulatory Visit (HOSPITAL_BASED_OUTPATIENT_CLINIC_OR_DEPARTMENT_OTHER): Payer: BC Managed Care – PPO

## 2014-02-23 ENCOUNTER — Telehealth: Payer: Self-pay | Admitting: Internal Medicine

## 2014-02-23 ENCOUNTER — Encounter: Payer: Self-pay | Admitting: Internal Medicine

## 2014-02-23 VITALS — BP 154/86 | HR 88 | Temp 98.2°F | Resp 18 | Ht 69.0 in | Wt 202.6 lb

## 2014-02-23 DIAGNOSIS — D751 Secondary polycythemia: Secondary | ICD-10-CM

## 2014-02-23 LAB — CBC WITH DIFFERENTIAL/PLATELET
BASO%: 0.1 % (ref 0.0–2.0)
Basophils Absolute: 0 10*3/uL (ref 0.0–0.1)
EOS%: 2.7 % (ref 0.0–7.0)
Eosinophils Absolute: 0.2 10*3/uL (ref 0.0–0.5)
HCT: 48.7 % (ref 38.4–49.9)
HGB: 16.2 g/dL (ref 13.0–17.1)
LYMPH%: 20.6 % (ref 14.0–49.0)
MCH: 30.7 pg (ref 27.2–33.4)
MCHC: 33.3 g/dL (ref 32.0–36.0)
MCV: 92.4 fL (ref 79.3–98.0)
MONO#: 0.4 10*3/uL (ref 0.1–0.9)
MONO%: 5.8 % (ref 0.0–14.0)
NEUT#: 5.3 10*3/uL (ref 1.5–6.5)
NEUT%: 70.8 % (ref 39.0–75.0)
Platelets: 215 10*3/uL (ref 140–400)
RBC: 5.27 10*6/uL (ref 4.20–5.82)
RDW: 14 % (ref 11.0–14.6)
WBC: 7.5 10*3/uL (ref 4.0–10.3)
lymph#: 1.6 10*3/uL (ref 0.9–3.3)

## 2014-02-23 LAB — COMPREHENSIVE METABOLIC PANEL (CC13)
ALT: 24 U/L (ref 0–55)
AST: 20 U/L (ref 5–34)
Albumin: 3.7 g/dL (ref 3.5–5.0)
Alkaline Phosphatase: 66 U/L (ref 40–150)
Anion Gap: 10 mEq/L (ref 3–11)
BUN: 25.6 mg/dL (ref 7.0–26.0)
CO2: 23 mEq/L (ref 22–29)
Calcium: 9.1 mg/dL (ref 8.4–10.4)
Chloride: 107 mEq/L (ref 98–109)
Creatinine: 1.2 mg/dL (ref 0.7–1.3)
EGFR: 71 mL/min/{1.73_m2} — ABNORMAL LOW (ref >90)
Glucose: 127 mg/dl (ref 70–140)
Potassium: 4 mEq/L (ref 3.5–5.1)
Sodium: 141 mEq/L (ref 136–145)
Total Bilirubin: 0.33 mg/dL (ref 0.20–1.20)
Total Protein: 6.7 g/dL (ref 6.4–8.3)

## 2014-02-23 LAB — LACTATE DEHYDROGENASE (CC13): LDH: 181 U/L (ref 125–245)

## 2014-02-23 NOTE — Progress Notes (Signed)
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   PRIOR THERAPY: None   CURRENT THERAPY: Phlebotomy on an as-needed basis noted to keep the hematocrit at 45% or less. He is scheduled for one today.  INTERVAL HISTORY: James [name] 53 y.o. male returns to the clinic today for routine 2 months followup visit. The patient is feeling fine with no complaints today. He denied having any significant fatigue or weakness. He denied having any weight loss or night sweats. He has no chest pain, shortness of breath, cough or hemoptysis. He denied having any significant bleeding issues. He has repeat CBC and comprehensive metabolic panel performed earlier today and he is here for evaluation and discussion of his lab results.  MEDICAL HISTORY: Past Medical History  Diagnosis Date  . Hepatitis C 04/29/2012    ALLERGIES:  has No Known Allergies.  MEDICATIONS:  Current Outpatient Prescriptions  Medication Sig Dispense Refill  . aspirin 325 MG tablet Take 325 mg by mouth 2 (two) times daily.    . Aspirin-Acetaminophen-Caffeine (GOODY HEADACHE PO) Take by mouth as needed.    . TESTIM 50 MG/5GM GEL     . valACYclovir (VALTREX) 500 MG tablet      No current facility-administered medications for this visit.    REVIEW OF SYSTEMS:  A comprehensive review of systems was negative.   PHYSICAL EXAMINATION: General appearance: alert, cooperative and no distress Head: Normocephalic, without obvious abnormality, atraumatic Neck: no adenopathy, no JVD, supple, symmetrical, trachea midline and thyroid not enlarged, symmetric, no tenderness/mass/nodules Lymph nodes: Cervical, supraclavicular, and axillary nodes normal. Resp: clear to auscultation bilaterally Back: symmetric, no curvature. ROM normal. No CVA  tenderness. Cardio: regular rate and rhythm, S1, S2 normal, no murmur, click, rub or gallop GI: soft, non-tender; bowel sounds normal; no masses,  no organomegaly Extremities: extremities normal, atraumatic, no cyanosis or edema  ECOG PERFORMANCE STATUS: 0 - Asymptomatic  Blood pressure 154/86, pulse 88, temperature 98.2 F (36.8 C), temperature source Oral, resp. rate 18, height 5\' 9"  (1.753 m), weight 202 lb 9.6 oz (91.899 kg), SpO2 97 %.  LABORATORY DATA: Lab Results  Component Value Date   WBC 7.5 02/23/2014   HGB 16.2 02/23/2014   HCT 48.7 02/23/2014   MCV 92.4 02/23/2014   PLT 215 02/23/2014      Chemistry      Component Value Date/Time   NA 141 12/23/2013 1404   NA 138 08/22/2011 0923   K 4.4 12/23/2013 1404   K 4.1 08/22/2011 0923   CL 106 08/06/2012 1536   CL 107 08/22/2011 0923   CO2 23 12/23/2013 1404   CO2 23 08/22/2011 0923   BUN 29.3* 12/23/2013 1404   BUN 22 08/22/2011 0923   CREATININE 1.2 12/23/2013 1404   CREATININE 1.08 08/22/2011 0923      Component Value Date/Time   CALCIUM 9.4 12/23/2013 1404   CALCIUM 9.1 08/22/2011 0923   ALKPHOS 63 12/23/2013 1404   ALKPHOS 51 08/22/2011 0923   AST 18 12/23/2013 1404   AST 27 08/22/2011 0923   ALT 19 12/23/2013 1404   ALT 33 08/22/2011 0923   BILITOT 0.45 12/23/2013 1404   BILITOT 0.5 08/22/2011 0923       RADIOGRAPHIC STUDIES: No results found.  ASSESSMENT AND PLAN: This is a very pleasant 53 years old  white male with reactive polycythemia secondary to hormonal treatment and currently on treatment with phlebotomy as needed basis to keep his hematocrit close to 45%. The patient is doing fine today. His hematocrit is elevated today to 48.7%. I gave the patient the option of continuous observation versus proceeding with phlebotomy today. He would like to proceed with phlebotomy. [name] with repeat CBC and comprehensive metabolic panel and phlebotomy as  needed. He was advised to call immediately if he has any concerning symptoms in the interval. The patient voices understanding of current disease status and treatment options and is in agreement with the current care plan.  All questions were answered. The patient knows to call the clinic with any problems, questions or concerns. We can certainly see the patient much sooner if necessary.  Disclaimer: This note was dictated with voice recognition software. Similar sounding words can inadvertently be transcribed and may not be corrected upon review.

## 2014-02-23 NOTE — Telephone Encounter (Signed)
Pt confirm appt for March. Didn't want AVS; shredded.

## 2014-02-23 NOTE — Patient Instructions (Signed)

## 2014-02-23 NOTE — Progress Notes (Signed)
Phlebotomy performed via right AC over approximately 10 minutes.  Approximately 500cc removed.  Pt tolerated well.  Food and drink given post procedure.  See flowsheets for vital signs.

## 2014-02-23 NOTE — Patient Instructions (Signed)
Smoking Cessation, Tips for Success  If you are ready to quit smoking, congratulations! You have chosen to help yourself be healthier. Cigarettes bring nicotine, tar, carbon monoxide, and other irritants into your body. Your lungs, heart, and blood vessels will be able to work better without these poisons. There are many different ways to quit smoking. Nicotine gum, nicotine patches, a nicotine inhaler, or nicotine nasal spray can help with physical craving. Hypnosis, support groups, and medicines help break the habit of smoking.  WHAT THINGS CAN I DO TO MAKE QUITTING EASIER?   Here are some tips to help you quit for good:  · Pick a date when you will quit smoking completely. Tell all of your friends and family about your plan to quit on that date.  · Do not try to slowly cut down on the number of cigarettes you are smoking. Pick a quit date and quit smoking completely starting on that day.  · Throw away all cigarettes.    · Clean and remove all ashtrays from your home, work, and car.  · On a card, write down your reasons for quitting. Carry the card with you and read it when you get the urge to smoke.  · Cleanse your body of nicotine. Drink enough water and fluids to keep your urine clear or pale yellow. Do this after quitting to flush the nicotine from your body.  · Learn to predict your moods. Do not let a bad situation be your excuse to have a cigarette. Some situations in your life might tempt you into wanting a cigarette.  · Never have "just one" cigarette. It leads to wanting another and another. Remind yourself of your decision to quit.  · Change habits associated with smoking. If you smoked while driving or when feeling stressed, try other activities to replace smoking. Stand up when drinking your coffee. Brush your teeth after eating. Sit in a different chair when you read the paper. Avoid alcohol while trying to quit, and try to drink fewer caffeinated beverages. Alcohol and caffeine may urge you to  smoke.  · Avoid foods and drinks that can trigger a desire to smoke, such as sugary or spicy foods and alcohol.  · Ask people who smoke not to smoke around you.  · Have something planned to do right after eating or having a cup of coffee. For example, plan to take a walk or exercise.  · Try a relaxation exercise to calm you down and decrease your stress. Remember, you may be tense and nervous for the first 2 weeks after you quit, but this will pass.  · Find new activities to keep your hands busy. Play with a pen, coin, or rubber band. Doodle or draw things on paper.  · Brush your teeth right after eating. This will help cut down on the craving for the taste of tobacco after meals. You can also try mouthwash.    · Use oral substitutes in place of cigarettes. Try using lemon drops, carrots, cinnamon sticks, or chewing gum. Keep them handy so they are available when you have the urge to smoke.  · When you have the urge to smoke, try deep breathing.  · Designate your home as a nonsmoking area.  · If you are a heavy smoker, ask your health care provider about a prescription for nicotine chewing gum. It can ease your withdrawal from nicotine.  · Reward yourself. Set aside the cigarette money you save and buy yourself something nice.  · Look for   support from others. Join a support group or smoking cessation program. Ask someone at home or at work to help you with your plan to quit smoking.  · Always ask yourself, "Do I need this cigarette or is this just a reflex?" Tell yourself, "Today, I choose not to smoke," or "I do not want to smoke." You are reminding yourself of your decision to quit.  · Do not replace cigarette smoking with electronic cigarettes (commonly called e-cigarettes). The safety of e-cigarettes is unknown, and some may contain harmful chemicals.  · If you relapse, do not give up! Plan ahead and think about what you will do the next time you get the urge to smoke.  HOW WILL I FEEL WHEN I QUIT SMOKING?  You  may have symptoms of withdrawal because your body is used to nicotine (the addictive substance in cigarettes). You may crave cigarettes, be irritable, feel very hungry, cough often, get headaches, or have difficulty concentrating. The withdrawal symptoms are only temporary. They are strongest when you first quit but will go away within 10-14 days. When withdrawal symptoms occur, stay in control. Think about your reasons for quitting. Remind yourself that these are signs that your body is healing and getting used to being without cigarettes. Remember that withdrawal symptoms are easier to treat than the major diseases that smoking can cause.   Even after the withdrawal is over, expect periodic urges to smoke. However, these cravings are generally short lived and will go away whether you smoke or not. Do not smoke!  WHAT RESOURCES ARE AVAILABLE TO HELP ME QUIT SMOKING?  Your health care provider can direct you to community resources or hospitals for support, which may include:  · Group support.  · Education.  · Hypnosis.  · Therapy.  Document Released: 11/25/2003 Document Revised: 07/13/2013 Document Reviewed: 08/14/2012  [employer]® Patient Information ©2015 [employer]. This information is not intended to replace advice given to you by your health care provider. Make sure you discuss any questions you have with your health care provider.

## 2014-05-25 ENCOUNTER — Telehealth: Payer: Self-pay | Admitting: Internal Medicine

## 2014-05-25 ENCOUNTER — Ambulatory Visit (HOSPITAL_BASED_OUTPATIENT_CLINIC_OR_DEPARTMENT_OTHER): Payer: BLUE CROSS/BLUE SHIELD | Admitting: Internal Medicine

## 2014-05-25 ENCOUNTER — Other Ambulatory Visit (HOSPITAL_BASED_OUTPATIENT_CLINIC_OR_DEPARTMENT_OTHER): Payer: BLUE CROSS/BLUE SHIELD

## 2014-05-25 ENCOUNTER — Encounter: Payer: Self-pay | Admitting: Internal Medicine

## 2014-05-25 ENCOUNTER — Ambulatory Visit (HOSPITAL_BASED_OUTPATIENT_CLINIC_OR_DEPARTMENT_OTHER): Payer: BLUE CROSS/BLUE SHIELD

## 2014-05-25 VITALS — BP 136/83 | HR 68 | Temp 98.4°F | Resp 20 | Ht 69.0 in | Wt 199.0 lb

## 2014-05-25 DIAGNOSIS — Z720 Tobacco use: Secondary | ICD-10-CM

## 2014-05-25 DIAGNOSIS — D751 Secondary polycythemia: Secondary | ICD-10-CM

## 2014-05-25 DIAGNOSIS — Z79899 Other long term (current) drug therapy: Secondary | ICD-10-CM

## 2014-05-25 LAB — CBC WITH DIFFERENTIAL/PLATELET
BASO%: 0.1 % (ref 0.0–2.0)
Basophils Absolute: 0 10*3/uL (ref 0.0–0.1)
EOS%: 2.9 % (ref 0.0–7.0)
Eosinophils Absolute: 0.2 10*3/uL (ref 0.0–0.5)
HCT: 50.3 % — ABNORMAL HIGH (ref 38.4–49.9)
HGB: 16.9 g/dL (ref 13.0–17.1)
LYMPH%: 19.9 % (ref 14.0–49.0)
MCH: 30.5 pg (ref 27.2–33.4)
MCHC: 33.6 g/dL (ref 32.0–36.0)
MCV: 90.8 fL (ref 79.3–98.0)
MONO#: 0.5 10*3/uL (ref 0.1–0.9)
MONO%: 6.6 % (ref 0.0–14.0)
NEUT#: 5 10*3/uL (ref 1.5–6.5)
NEUT%: 70.5 % (ref 39.0–75.0)
Platelets: 196 10*3/uL (ref 140–400)
RBC: 5.54 10*6/uL (ref 4.20–5.82)
RDW: 14 % (ref 11.0–14.6)
WBC: 7.1 10*3/uL (ref 4.0–10.3)
lymph#: 1.4 10*3/uL (ref 0.9–3.3)

## 2014-05-25 NOTE — Progress Notes (Signed)
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   PRIOR THERAPY: None   CURRENT THERAPY: Phlebotomy on an as-needed basis noted to keep the hematocrit at 45% or less. He is scheduled for one today.  INTERVAL HISTORY: James [name] 54 y.o. male returns to the clinic today for routine 3 months followup visit. The patient is feeling fine with no complaints today. He started smoking few cigars several times a week. He also still on testosterone gel by his urologist. He denied having any significant fatigue or weakness. He denied having any weight loss or night sweats. He has no chest pain, shortness of breath, cough or hemoptysis. He denied having any significant bleeding issues. He has repeat CBC and comprehensive metabolic panel performed earlier today and he is here for evaluation and discussion of his lab results.  MEDICAL HISTORY: Past Medical History  Diagnosis Date  . Hepatitis C 04/29/2012    ALLERGIES:  has No Known Allergies.  MEDICATIONS:  Current Outpatient Prescriptions  Medication Sig Dispense Refill  . aspirin 325 MG tablet Take 325 mg by mouth 2 (two) times daily.    . Aspirin-Acetaminophen-Caffeine (GOODY HEADACHE PO) Take by mouth as needed.    . TESTIM 50 MG/5GM GEL     . valACYclovir (VALTREX) 500 MG tablet      No current facility-administered medications for this visit.    REVIEW OF SYSTEMS:  A comprehensive review of systems was negative.   PHYSICAL EXAMINATION: General appearance: alert, cooperative and no distress Head: Normocephalic, without obvious abnormality, atraumatic Neck: no adenopathy, no JVD, supple, symmetrical, trachea midline and thyroid not enlarged, symmetric, no tenderness/mass/nodules Lymph nodes: Cervical, supraclavicular, and axillary nodes  normal. Resp: clear to auscultation bilaterally Back: symmetric, no curvature. ROM normal. No CVA tenderness. Cardio: regular rate and rhythm, S1, S2 normal, no murmur, click, rub or gallop GI: soft, non-tender; bowel sounds normal; no masses,  no organomegaly Extremities: extremities normal, atraumatic, no cyanosis or edema  ECOG PERFORMANCE STATUS: 0 - Asymptomatic  Blood pressure 136/83, pulse 68, temperature 98.4 F (36.9 C), temperature source Oral, resp. rate 20, height 5\' 9"  (1.753 m), weight 199 lb (90.266 kg).  LABORATORY DATA: Lab Results  Component Value Date   WBC 7.1 05/25/2014   HGB 16.9 05/25/2014   HCT 50.3* 05/25/2014   MCV 90.8 05/25/2014   PLT 196 05/25/2014      Chemistry      Component Value Date/Time   NA 141 02/23/2014 1333   NA 138 08/22/2011 0923   K 4.0 02/23/2014 1333   K 4.1 08/22/2011 0923   CL 106 08/06/2012 1536   CL 107 08/22/2011 0923   CO2 23 02/23/2014 1333   CO2 23 08/22/2011 0923   BUN 25.6 02/23/2014 1333   BUN 22 08/22/2011 0923   CREATININE 1.2 02/23/2014 1333   CREATININE 1.08 08/22/2011 0923      Component Value Date/Time   CALCIUM 9.1 02/23/2014 1333   CALCIUM 9.1 08/22/2011 0923   ALKPHOS 66 02/23/2014 1333   ALKPHOS 51 08/22/2011 0923   AST 20 02/23/2014 1333   AST 27 08/22/2011 0923   ALT 24 02/23/2014 1333   ALT 33 08/22/2011 0923   BILITOT 0.33 02/23/2014 1333   BILITOT 0.5 08/22/2011 0923       RADIOGRAPHIC STUDIES: No results  found.  ASSESSMENT AND PLAN: This is a very pleasant 54 years old white male with reactive polycythemia secondary to hormonal treatment and currently on treatment with phlebotomy as needed basis to keep his hematocrit close to 45%. The patient is doing fine today. His hematocrit is elevated today to 50.3%. I recommended for the patient to proceed with phlebotomy today. He would come back for follow-up visit in 2 months with repeat CBC and comprehensive metabolic panel and phlebotomy as  needed. He was advised to call immediately if he has any concerning symptoms in the interval. The patient voices understanding of current disease status and treatment options and is in agreement with the current care plan.  All questions were answered. The patient knows to call the clinic with any problems, questions or concerns. We can certainly see the patient much sooner if necessary.  Disclaimer: This note was dictated with voice recognition software. Similar sounding words can inadvertently be transcribed and may not be corrected upon review.

## 2014-05-25 NOTE — Patient Instructions (Signed)
Smoking Cessation, Tips for Success  If you are ready to quit smoking, congratulations! You have chosen to help yourself be healthier. Cigarettes bring nicotine, tar, carbon monoxide, and other irritants into your body. Your lungs, heart, and blood vessels will be able to work better without these poisons. There are many different ways to quit smoking. Nicotine gum, nicotine patches, a nicotine inhaler, or nicotine nasal spray can help with physical craving. Hypnosis, support groups, and medicines help break the habit of smoking.  WHAT THINGS CAN I DO TO MAKE QUITTING EASIER?   Here are some tips to help you quit for good:  · Pick a date when you will quit smoking completely. Tell all of your friends and family about your plan to quit on that date.  · Do not try to slowly cut down on the number of cigarettes you are smoking. Pick a quit date and quit smoking completely starting on that day.  · Throw away all cigarettes.    · Clean and remove all ashtrays from your home, work, and car.  · On a card, write down your reasons for quitting. Carry the card with you and read it when you get the urge to smoke.  · Cleanse your body of nicotine. Drink enough water and fluids to keep your urine clear or pale yellow. Do this after quitting to flush the nicotine from your body.  · Learn to predict your moods. Do not let a bad situation be your excuse to have a cigarette. Some situations in your life might tempt you into wanting a cigarette.  · Never have "just one" cigarette. It leads to wanting another and another. Remind yourself of your decision to quit.  · Change habits associated with smoking. If you smoked while driving or when feeling stressed, try other activities to replace smoking. Stand up when drinking your coffee. Brush your teeth after eating. Sit in a different chair when you read the paper. Avoid alcohol while trying to quit, and try to drink fewer caffeinated beverages. Alcohol and caffeine may urge you to  smoke.  · Avoid foods and drinks that can trigger a desire to smoke, such as sugary or spicy foods and alcohol.  · Ask people who smoke not to smoke around you.  · Have something planned to do right after eating or having a cup of coffee. For example, plan to take a walk or exercise.  · Try a relaxation exercise to calm you down and decrease your stress. Remember, you may be tense and nervous for the first 2 weeks after you quit, but this will pass.  · Find new activities to keep your hands busy. Play with a pen, coin, or rubber band. Doodle or draw things on paper.  · Brush your teeth right after eating. This will help cut down on the craving for the taste of tobacco after meals. You can also try mouthwash.    · Use oral substitutes in place of cigarettes. Try using lemon drops, carrots, cinnamon sticks, or chewing gum. Keep them handy so they are available when you have the urge to smoke.  · When you have the urge to smoke, try deep breathing.  · Designate your home as a nonsmoking area.  · If you are a heavy smoker, ask your health care provider about a prescription for nicotine chewing gum. It can ease your withdrawal from nicotine.  · Reward yourself. Set aside the cigarette money you save and buy yourself something nice.  · Look for   support from others. Join a support group or smoking cessation program. Ask someone at home or at work to help you with your plan to quit smoking.  · Always ask yourself, "Do I need this cigarette or is this just a reflex?" Tell yourself, "Today, I choose not to smoke," or "I do not want to smoke." You are reminding yourself of your decision to quit.  · Do not replace cigarette smoking with electronic cigarettes (commonly called e-cigarettes). The safety of e-cigarettes is unknown, and some may contain harmful chemicals.  · If you relapse, do not give up! Plan ahead and think about what you will do the next time you get the urge to smoke.  HOW WILL I FEEL WHEN I QUIT SMOKING?  You  may have symptoms of withdrawal because your body is used to nicotine (the addictive substance in cigarettes). You may crave cigarettes, be irritable, feel very hungry, cough often, get headaches, or have difficulty concentrating. The withdrawal symptoms are only temporary. They are strongest when you first quit but will go away within 10-14 days. When withdrawal symptoms occur, stay in control. Think about your reasons for quitting. Remind yourself that these are signs that your body is healing and getting used to being without cigarettes. Remember that withdrawal symptoms are easier to treat than the major diseases that smoking can cause.   Even after the withdrawal is over, expect periodic urges to smoke. However, these cravings are generally short lived and will go away whether you smoke or not. Do not smoke!  WHAT RESOURCES ARE AVAILABLE TO HELP ME QUIT SMOKING?  Your health care provider can direct you to community resources or hospitals for support, which may include:  · Group support.  · Education.  · Hypnosis.  · Therapy.  Document Released: 11/25/2003 Document Revised: 07/13/2013 Document Reviewed: 08/14/2012  [employer]® Patient Information ©2015 [employer]. This information is not intended to replace advice given to you by your health care provider. Make sure you discuss any questions you have with your health care provider.

## 2014-05-25 NOTE — Patient Instructions (Signed)

## 2014-05-25 NOTE — Telephone Encounter (Signed)
gv and printed appt sched and avs forpt for May...sed added tx. °

## 2014-05-25 NOTE — Progress Notes (Signed)
One unit of blood removed using vein in right AC.  Blood flowed easily and pt tolerated procedure well.  Coke and cheese and crackers given after phlebotomy.

## 2014-07-21 ENCOUNTER — Ambulatory Visit: Payer: BLUE CROSS/BLUE SHIELD | Admitting: Internal Medicine

## 2014-07-21 ENCOUNTER — Other Ambulatory Visit: Payer: BLUE CROSS/BLUE SHIELD

## 2014-08-27 ENCOUNTER — Telehealth: Payer: Self-pay | Admitting: Internal Medicine

## 2014-08-27 NOTE — Telephone Encounter (Signed)
returned call and s.w. pt and r.s appt per pt request....pt ok and aware °

## 2014-08-30 ENCOUNTER — Ambulatory Visit: Payer: Self-pay | Admitting: Podiatry

## 2014-09-07 ENCOUNTER — Encounter: Payer: Self-pay | Admitting: Podiatry

## 2014-09-20 ENCOUNTER — Telehealth: Payer: Self-pay | Admitting: *Deleted

## 2014-09-20 ENCOUNTER — Ambulatory Visit (HOSPITAL_BASED_OUTPATIENT_CLINIC_OR_DEPARTMENT_OTHER): Payer: BLUE CROSS/BLUE SHIELD | Admitting: Internal Medicine

## 2014-09-20 ENCOUNTER — Ambulatory Visit (HOSPITAL_BASED_OUTPATIENT_CLINIC_OR_DEPARTMENT_OTHER): Payer: BLUE CROSS/BLUE SHIELD

## 2014-09-20 ENCOUNTER — Telehealth: Payer: Self-pay | Admitting: Internal Medicine

## 2014-09-20 ENCOUNTER — Other Ambulatory Visit (HOSPITAL_BASED_OUTPATIENT_CLINIC_OR_DEPARTMENT_OTHER): Payer: BLUE CROSS/BLUE SHIELD

## 2014-09-20 ENCOUNTER — Encounter: Payer: Self-pay | Admitting: Internal Medicine

## 2014-09-20 VITALS — BP 113/74 | HR 79 | Temp 98.3°F | Resp 18 | Ht 69.0 in | Wt 196.6 lb

## 2014-09-20 DIAGNOSIS — D751 Secondary polycythemia: Secondary | ICD-10-CM

## 2014-09-20 LAB — COMPREHENSIVE METABOLIC PANEL (CC13)
ALT: 25 U/L (ref 0–55)
AST: 20 U/L (ref 5–34)
Albumin: 3.5 g/dL (ref 3.5–5.0)
Alkaline Phosphatase: 50 U/L (ref 40–150)
Anion Gap: 8 mEq/L (ref 3–11)
BUN: 25.4 mg/dL (ref 7.0–26.0)
CO2: 22 mEq/L (ref 22–29)
Calcium: 8.9 mg/dL (ref 8.4–10.4)
Chloride: 112 mEq/L — ABNORMAL HIGH (ref 98–109)
Creatinine: 1.1 mg/dL (ref 0.7–1.3)
EGFR: 79 mL/min/{1.73_m2} — ABNORMAL LOW (ref >90)
Glucose: 102 mg/dl (ref 70–140)
Potassium: 4.1 mEq/L (ref 3.5–5.1)
Sodium: 141 mEq/L (ref 136–145)
Total Bilirubin: 0.39 mg/dL (ref 0.20–1.20)
Total Protein: 6.5 g/dL (ref 6.4–8.3)

## 2014-09-20 LAB — CBC WITH DIFFERENTIAL/PLATELET
BASO%: 0.4 % (ref 0.0–2.0)
Basophils Absolute: 0 10*3/uL (ref 0.0–0.1)
EOS%: 3.7 % (ref 0.0–7.0)
Eosinophils Absolute: 0.2 10*3/uL (ref 0.0–0.5)
HCT: 50.9 % — ABNORMAL HIGH (ref 38.4–49.9)
HGB: 16.8 g/dL (ref 13.0–17.1)
LYMPH%: 22 % (ref 14.0–49.0)
MCH: 30.2 pg (ref 27.2–33.4)
MCHC: 33 g/dL (ref 32.0–36.0)
MCV: 91.7 fL (ref 79.3–98.0)
MONO#: 0.5 10*3/uL (ref 0.1–0.9)
MONO%: 7.2 % (ref 0.0–14.0)
NEUT#: 4.4 10*3/uL (ref 1.5–6.5)
NEUT%: 66.7 % (ref 39.0–75.0)
Platelets: 227 10*3/uL (ref 140–400)
RBC: 5.56 10*6/uL (ref 4.20–5.82)
RDW: 15.6 % — ABNORMAL HIGH (ref 11.0–14.6)
WBC: 6.7 10*3/uL (ref 4.0–10.3)
lymph#: 1.5 10*3/uL (ref 0.9–3.3)

## 2014-09-20 LAB — LACTATE DEHYDROGENASE (CC13): LDH: 190 U/L (ref 125–245)

## 2014-09-20 NOTE — Patient Instructions (Signed)

## 2014-09-20 NOTE — Progress Notes (Signed)
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   PRIOR THERAPY: None   CURRENT THERAPY: Phlebotomy on an as-needed basis noted to keep the hematocrit at 45% or less. He is scheduled for one today.  INTERVAL HISTORY: James [name] 54 y.o. male returns to the clinic today for routine 3 months followup visit. The patient is feeling fine with no complaints today. He also still on testosterone gel by his urologist and he will try to reduce the dose in the fall. He denied having any significant fatigue or weakness. He denied having any weight loss or night sweats. He has no chest pain, shortness of breath, cough or hemoptysis. He denied having any significant bleeding issues. He has repeat CBC and comprehensive metabolic panel performed earlier today and he is here for evaluation and discussion of his lab results.  MEDICAL HISTORY: Past Medical History  Diagnosis Date  . Hepatitis C 04/29/2012    ALLERGIES:  has No Known Allergies.  MEDICATIONS:  Current Outpatient Prescriptions  Medication Sig Dispense Refill  . aspirin 325 MG tablet Take 325 mg by mouth 2 (two) times daily.    . Aspirin-Acetaminophen-Caffeine (GOODY HEADACHE PO) Take by mouth as needed.    [name] atovaquone-proguanil (MALARONE) 250-100 MG TABS TAKE 1 TABLET BY MOUTH EVERY DAY START 2 DAYS PRIOR UNTIL FINISHED  0  . metroNIDAZOLE (FLAGYL) 500 MG tablet   0  . TESTIM 50 MG/5GM GEL     . valACYclovir (VALTREX) 500 MG tablet      No current facility-administered medications for this visit.    REVIEW OF SYSTEMS:  A comprehensive review of systems was negative.   PHYSICAL EXAMINATION: General appearance: alert, cooperative and no distress Head: Normocephalic, without obvious abnormality, atraumatic Neck: no adenopathy, no JVD, supple,  symmetrical, trachea midline and thyroid not enlarged, symmetric, no tenderness/mass/nodules Lymph nodes: Cervical, supraclavicular, and axillary nodes normal. Resp: clear to auscultation bilaterally Back: symmetric, no curvature. ROM normal. No CVA tenderness. Cardio: regular rate and rhythm, S1, S2 normal, no murmur, click, rub or gallop GI: soft, non-tender; bowel sounds normal; no masses,  no organomegaly Extremities: extremities normal, atraumatic, no cyanosis or edema  ECOG PERFORMANCE STATUS: 0 - Asymptomatic  Blood pressure 113/74, pulse 79, temperature 98.3 F (36.8 C), temperature source Oral, resp. rate 18, height 5\' 9"  (1.753 m), weight 196 lb 9.6 oz (89.177 kg), SpO2 98 %.  LABORATORY DATA: Lab Results  Component Value Date   WBC 6.7 09/20/2014   HGB 16.8 09/20/2014   HCT 50.9* 09/20/2014   MCV 91.7 09/20/2014   PLT 227 09/20/2014      Chemistry      Component Value Date/Time   NA 141 09/20/2014 1514   NA 138 08/22/2011 0923   K 4.1 09/20/2014 1514   K 4.1 08/22/2011 0923   CL 106 08/06/2012 1536   CL 107 08/22/2011 0923   CO2 22 09/20/2014 1514   CO2 23 08/22/2011 0923   BUN 25.4 09/20/2014 1514   BUN 22 08/22/2011 0923   CREATININE 1.1 09/20/2014 1514   CREATININE 1.08 08/22/2011 0923      Component Value Date/Time   CALCIUM 8.9 09/20/2014 1514   CALCIUM 9.1 08/22/2011 0923   ALKPHOS 50 09/20/2014 1514   ALKPHOS 51 08/22/2011 0923   AST 20 09/20/2014 1514  AST 27 08/22/2011 0923   ALT 25 09/20/2014 1514   ALT 33 08/22/2011 0923   BILITOT 0.39 09/20/2014 1514   BILITOT 0.5 08/22/2011 0923       RADIOGRAPHIC STUDIES: No results found.  ASSESSMENT AND PLAN: This is a very pleasant 54 years old white male with reactive polycythemia secondary to hormonal treatment and currently on treatment with phlebotomy as needed basis to keep his hematocrit close to 45%. The patient is doing fine today. His hematocrit is elevated today to 50.9%. I recommended  for the patient to proceed with phlebotomy today. [name] with repeat CBC and comprehensive metabolic panel and phlebotomy as needed. He was advised to call immediately if he has any concerning symptoms in the interval. The patient voices understanding of current disease status and treatment options and is in agreement with the current care plan.  All questions were answered. The patient knows to call the clinic with any problems, questions or concerns. We can certainly see the patient much sooner if necessary.  Disclaimer: This note was dictated with voice recognition software. Similar sounding words can inadvertently be transcribed and may not be corrected upon review.

## 2014-09-20 NOTE — Telephone Encounter (Signed)
Pt confirmed labs/ov per 07/11 POF, gave pt AVS and Calendar.Cherylann Banas, sent msg to add Phlebotomy

## 2014-09-20 NOTE — Telephone Encounter (Signed)
Per staff message and POF I have scheduled appts. Advised scheduler of appts. JMW  

## 2014-09-23 ENCOUNTER — Ambulatory Visit (INDEPENDENT_AMBULATORY_CARE_PROVIDER_SITE_OTHER): Payer: BLUE CROSS/BLUE SHIELD

## 2014-09-23 ENCOUNTER — Encounter: Payer: Self-pay | Admitting: Podiatry

## 2014-09-23 ENCOUNTER — Ambulatory Visit (INDEPENDENT_AMBULATORY_CARE_PROVIDER_SITE_OTHER): Payer: BLUE CROSS/BLUE SHIELD | Admitting: Podiatry

## 2014-09-23 VITALS — BP 154/84 | HR 96 | Resp 15

## 2014-09-23 DIAGNOSIS — M722 Plantar fascial fibromatosis: Secondary | ICD-10-CM | POA: Diagnosis not present

## 2014-09-23 MED ORDER — TRIAMCINOLONE ACETONIDE 10 MG/ML IJ SUSP
10.0000 mg | Freq: Once | INTRAMUSCULAR | Status: AC
  Administered 2014-09-23: 10 mg

## 2014-09-23 MED ORDER — DICLOFENAC SODIUM 75 MG PO TBEC: 75.0000 mg | DELAYED_RELEASE_TABLET | Freq: Two times a day (BID) | ORAL | Status: DC

## 2014-09-23 NOTE — Patient Instructions (Signed)

## 2014-09-23 NOTE — Progress Notes (Signed)
   Subjective:    Patient ID: James [name], male    DOB: 1960/06/25, 54 y.o.   MRN: [id number]  HPI  Pt presents with left heel pain, plantar fascial pain for 3 months, has tried nsaids and stretching  Review of Systems  All other systems reviewed and are negative.      Objective:   Physical Exam        Assessment & Plan:

## 2014-09-24 NOTE — Progress Notes (Signed)
Subjective:     Patient ID: James [name], male   DOB: 1961-02-19, 54 y.o.   MRN: [id number]  HPI patient presents with approximate 4 month history of heel pain left over right that makes ambulation difficult and he is a very active man and knows he also has flatfoot deformity   Review of Systems  All other systems reviewed and are negative.      Objective:   Physical Exam  Constitutional: He is oriented to person, place, and time.  Cardiovascular: Intact distal pulses.   Musculoskeletal: Normal range of motion.  Neurological: He is oriented to person, place, and time.  Skin: Skin is warm.  Nursing note and vitals reviewed.  neurovascular status intact muscle strength adequate with range of motion subtalar midtarsal joint within normal limits. Patient is noted to have moderate flatfoot deformity and does have mild structural bunions and exquisite discomfort plantar heel left at the insertional point tendon the calcaneus with mild pain right. Patient has good range of motion of the first MPJ and has good digital perfusion and is well oriented 3     Assessment:     Acute plantar fasciitis left over right with long-term history of problems and flatfoot deformity    Plan:     H&P and x-rays reviewed with patient. Today I went ahead and I injected the left plantar fascia 3 mg Kenalog 5 mg Xylocaine and applied fascial brace with all instructions on usage. Instructed on physical therapy and reappoint and also due to the flat foot and the fact he is on a fairly fast recovery cycle we did go ahead and scanned him for custom orthotic devices today. Reappoint 3 weeks to reevaluate and to disperse orthotics

## 2014-10-14 ENCOUNTER — Ambulatory Visit: Payer: BLUE CROSS/BLUE SHIELD | Admitting: Podiatry

## 2014-11-05 ENCOUNTER — Ambulatory Visit: Payer: BLUE CROSS/BLUE SHIELD | Admitting: *Deleted

## 2014-11-05 DIAGNOSIS — M722 Plantar fascial fibromatosis: Secondary | ICD-10-CM

## 2014-11-05 NOTE — Progress Notes (Signed)
Patient ID: James [name], male   DOB: 06/10/1960, 55 y.o.   MRN: [id number] Patient presents for orthotic pick up.  Verbal and written break in and wear instructions given.  Patient will follow up in 4 weeks if symptoms worsen or fail to improve.

## 2014-11-05 NOTE — Patient Instructions (Signed)

## 2014-11-10 NOTE — Progress Notes (Signed)
This encounter was created in error - please disregard.

## 2014-11-22 ENCOUNTER — Telehealth: Payer: Self-pay | Admitting: Internal Medicine

## 2014-11-22 NOTE — Telephone Encounter (Signed)
returned call and s.w. pt and confirmed appt....pt ok and aware °

## 2014-12-08 ENCOUNTER — Telehealth: Payer: Self-pay | Admitting: Internal Medicine

## 2014-12-08 NOTE — Telephone Encounter (Signed)
Moved 10/11 appointment from [facility] to [facility]. Spoke with patient he is aware and has new time for 10/11 @ 1:30 pm.

## 2014-12-21 ENCOUNTER — Ambulatory Visit (HOSPITAL_BASED_OUTPATIENT_CLINIC_OR_DEPARTMENT_OTHER): Payer: BLUE CROSS/BLUE SHIELD

## 2014-12-21 ENCOUNTER — Telehealth: Payer: Self-pay | Admitting: Internal Medicine

## 2014-12-21 ENCOUNTER — Encounter: Payer: Self-pay | Admitting: Internal Medicine

## 2014-12-21 ENCOUNTER — Ambulatory Visit (HOSPITAL_BASED_OUTPATIENT_CLINIC_OR_DEPARTMENT_OTHER): Payer: BLUE CROSS/BLUE SHIELD | Admitting: Internal Medicine

## 2014-12-21 ENCOUNTER — Other Ambulatory Visit (HOSPITAL_BASED_OUTPATIENT_CLINIC_OR_DEPARTMENT_OTHER): Payer: BLUE CROSS/BLUE SHIELD

## 2014-12-21 VITALS — BP 125/90 | HR 82 | Temp 98.3°F | Resp 20 | Ht 69.0 in | Wt 199.6 lb

## 2014-12-21 DIAGNOSIS — D751 Secondary polycythemia: Secondary | ICD-10-CM

## 2014-12-21 LAB — CBC WITH DIFFERENTIAL/PLATELET
BASO%: 0.6 % (ref 0.0–2.0)
Basophils Absolute: 0 10*3/uL (ref 0.0–0.1)
EOS%: 4.2 % (ref 0.0–7.0)
Eosinophils Absolute: 0.3 10*3/uL (ref 0.0–0.5)
HCT: 51 % — ABNORMAL HIGH (ref 38.4–49.9)
HGB: 17.1 g/dL (ref 13.0–17.1)
LYMPH%: 21.4 % (ref 14.0–49.0)
MCH: 30.9 pg (ref 27.2–33.4)
MCHC: 33.6 g/dL (ref 32.0–36.0)
MCV: 92.2 fL (ref 79.3–98.0)
MONO#: 0.5 10*3/uL (ref 0.1–0.9)
MONO%: 7 % (ref 0.0–14.0)
NEUT#: 5.1 10*3/uL (ref 1.5–6.5)
NEUT%: 66.8 % (ref 39.0–75.0)
Platelets: 213 10*3/uL (ref 140–400)
RBC: 5.53 10*6/uL (ref 4.20–5.82)
RDW: 13.6 % (ref 11.0–14.6)
WBC: 7.6 10*3/uL (ref 4.0–10.3)
lymph#: 1.6 10*3/uL (ref 0.9–3.3)

## 2014-12-21 NOTE — Progress Notes (Signed)
Therapeutic phlebotomy done via 16 g needle in R ACF for 523 gms. Tolerated well. Given beverages before and after. VSS  30 minutes after procedure.

## 2014-12-21 NOTE — Progress Notes (Signed)
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   PRIOR THERAPY: None   CURRENT THERAPY: Phlebotomy on an as-needed basis noted to keep the hematocrit at 45% or less. He is scheduled for one today.  INTERVAL HISTORY: James [name] 54 y.o. male returns to the clinic today for routine 3 months followup visit. The patient is feeling fine with no complaints today. He also still on testosterone gel by his urologist. He is expected to have right hip replacement by Dr. Alvan Dame on 01/12/2015. He denied having any significant fatigue or weakness. He denied having any weight loss or night sweats. He has no chest pain, shortness of breath, cough or hemoptysis. He denied having any significant bleeding issues. He has repeat CBC and comprehensive metabolic panel performed earlier today and he is here for evaluation and discussion of his lab results.  MEDICAL HISTORY: Past Medical History  Diagnosis Date  . Hepatitis C 04/29/2012    ALLERGIES:  has No Known Allergies.  MEDICATIONS:  Current Outpatient Prescriptions  Medication Sig Dispense Refill  . aspirin 325 MG tablet Take 325 mg by mouth 2 (two) times daily.    . Aspirin-Acetaminophen-Caffeine (GOODY HEADACHE PO) Take by mouth as needed.    [name] atovaquone-proguanil (MALARONE) 250-100 MG TABS TAKE 1 TABLET BY MOUTH EVERY DAY START 2 DAYS PRIOR UNTIL FINISHED  0  . diclofenac (VOLTAREN) 75 MG EC tablet Take 1 tablet (75 mg total) by mouth 2 (two) times daily. 50 tablet 2  . meloxicam (MOBIC) 7.5 MG tablet take 1 tablet by mouth once daily with food or milk  0  . metroNIDAZOLE (FLAGYL) 500 MG tablet   0  . TESTIM 50 MG/5GM GEL     . valACYclovir (VALTREX) 500 MG tablet     . valACYclovir (VALTREX) 1000 MG tablet   2   No current facility-administered  medications for this visit.    REVIEW OF SYSTEMS:  A comprehensive review of systems was negative.   PHYSICAL EXAMINATION: General appearance: alert, cooperative and no distress Head: Normocephalic, without obvious abnormality, atraumatic Neck: no adenopathy, no JVD, supple, symmetrical, trachea midline and thyroid not enlarged, symmetric, no tenderness/mass/nodules Lymph nodes: Cervical, supraclavicular, and axillary nodes normal. Resp: clear to auscultation bilaterally Back: symmetric, no curvature. ROM normal. No CVA tenderness. Cardio: regular rate and rhythm, S1, S2 normal, no murmur, click, rub or gallop GI: soft, non-tender; bowel sounds normal; no masses,  no organomegaly Extremities: extremities normal, atraumatic, no cyanosis or edema  ECOG PERFORMANCE STATUS: 0 - Asymptomatic  Blood pressure 125/90, pulse 82, temperature 98.3 F (36.8 C), temperature source Oral, resp. rate 20, height 5\' 9"  (1.753 m), weight 199 lb 9.6 oz (90.538 kg), SpO2 97 %.  LABORATORY DATA: Lab Results  Component Value Date   WBC 7.6 12/21/2014   HGB 17.1 12/21/2014   HCT 51.0* 12/21/2014   MCV 92.2 12/21/2014   PLT 213 12/21/2014      Chemistry      Component Value Date/Time   NA 141 09/20/2014 1514   NA 138 08/22/2011 0923   K 4.1 09/20/2014 1514   K 4.1 08/22/2011 0923   CL 106 08/06/2012 1536   CL 107 08/22/2011 0923   CO2 22 09/20/2014 1514   CO2 23 08/22/2011 0923   BUN 25.4 09/20/2014 1514  BUN 22 08/22/2011 0923   CREATININE 1.1 09/20/2014 1514   CREATININE 1.08 08/22/2011 0923      Component Value Date/Time   CALCIUM 8.9 09/20/2014 1514   CALCIUM 9.1 08/22/2011 0923   ALKPHOS 50 09/20/2014 1514   ALKPHOS 51 08/22/2011 0923   AST 20 09/20/2014 1514   AST 27 08/22/2011 0923   ALT 25 09/20/2014 1514   ALT 33 08/22/2011 0923   BILITOT 0.39 09/20/2014 1514   BILITOT 0.5 08/22/2011 0923       RADIOGRAPHIC STUDIES: No results found.  ASSESSMENT AND PLAN: [name] with reactive polycythemia secondary to hormonal treatment and currently on treatment with phlebotomy as needed basis to keep his hematocrit close to 45%. The patient is doing fine today. His hematocrit is elevated today to 51.0%. I recommended for the patient to proceed with phlebotomy today. [name] with repeat CBC and comprehensive metabolic panel and phlebotomy as needed. He was advised to call immediately if he has any concerning symptoms in the interval. The patient voices understanding of current disease status and treatment options and is in agreement with the current care plan.  All questions were answered. The patient knows to call the clinic with any problems, questions or concerns. We can certainly see the patient much sooner if necessary.  Disclaimer: This note was dictated with voice recognition software. Similar sounding words can inadvertently be transcribed and may not be corrected upon review.

## 2014-12-21 NOTE — Telephone Encounter (Signed)
Gave patient avs report and appointments for January 2017. °

## 2014-12-21 NOTE — Patient Instructions (Signed)

## 2014-12-22 NOTE — Progress Notes (Signed)
Please put orders in Epic surgery 01-11-15 pre op 01-06-15 Thanks

## 2014-12-30 NOTE — H&P (Signed)
TOTAL HIP ADMISSION H&P  Patient is admitted for right total hip arthroplasty, anterior approach.  Subjective:  Chief Complaint: Right hip primary OA / pain  HPI: James [name], 54 y.o. male, has a history of pain and functional disability in the right hip(s) due to arthritis and patient has failed non-surgical conservative treatments for greater than 12 weeks to include NSAID's and/or analgesics, corticosteriod injections and activity modification.  Onset of symptoms was gradual starting 1.5+ years ago with gradually worsening course since that time.The patient noted no past surgery on the right hip(s).  Patient currently rates pain in the right hip at 8 out of 10 with activity. Patient has worsening of pain with activity and weight bearing, trendelenberg gait, pain that interfers with activities of daily living, pain with passive range of motion, crepitus and joint swelling. Patient has evidence of periarticular osteophytes and joint space narrowing by imaging studies. This condition presents safety issues increasing the risk of falls.   There is no current active infection.  Risks, benefits and expectations were discussed with the patient.  Risks including but not limited to the risk of anesthesia, blood clots, nerve damage, blood vessel damage, failure of the prosthesis, infection and up to and including death.  Patient understand the risks, benefits and expectations and wishes to proceed with surgery.   PCP: No PCP Per Patient  D/C Plans:      Home Outpatient  Post-op Meds:       No Rx given  Tranexamic Acid:      To be given - IV   Decadron:      Is to be given  FYI:     ASA post-op  Norco post-op (limited amount, addiction issues)  Known increase H&H    Patient Active Problem List   Diagnosis Date Noted  . Hepatitis C 04/29/2012  . Polycythemia 08/22/2011   Past Medical History  Diagnosis Date  . Hepatitis C 04/29/2012    No past surgical history on file.  No prescriptions  prior to admission   No Known Allergies   Social History  Substance Use Topics  . Smoking status: Light Tobacco Smoker    Types: Cigars  . Smokeless tobacco: Not on file  . Alcohol Use: No       Review of Systems  Constitutional: Negative.   HENT: Negative.   Eyes: Negative.   Respiratory: Negative.   Cardiovascular: Negative.   Gastrointestinal: Negative.   Genitourinary: Negative.   Musculoskeletal: Positive for joint pain.  Skin: Negative.   Neurological: Negative.   Endo/Heme/Allergies: Negative.   Psychiatric/Behavioral: Negative.     Objective:  Physical Exam  Constitutional: He is oriented to person, place, and time. He appears well-developed.  HENT:  Head: Normocephalic.  Eyes: Pupils are equal, round, and reactive to light.  Neck: Neck supple. No JVD present. No tracheal deviation present. No thyromegaly present.  Cardiovascular: Normal rate, regular rhythm, normal heart sounds and intact distal pulses.   Respiratory: Effort normal and breath sounds normal. No stridor. No respiratory distress. He has no wheezes.  GI: Soft. There is no tenderness. There is no guarding.  Musculoskeletal:       Right hip: He exhibits decreased range of motion, decreased strength, tenderness and bony tenderness. He exhibits no swelling, no deformity and no laceration.  Lymphadenopathy:    He has no cervical adenopathy.  Neurological: He is alert and oriented to person, place, and time.  Skin: Skin is warm and dry.  Psychiatric: He has  a normal mood and affect.     Labs:  Estimated body mass index is 29.02 kg/(m^2) as calculated from the following:   Height as of 09/20/14: 5\' 9"  (1.753 m).   Weight as of 09/20/14: 89.177 kg (196 lb 9.6 oz).   Imaging Review Plain radiographs demonstrate severe degenerative joint disease of the right hip(s). The bone quality appears to be good for age and reported activity level.  Assessment/Plan:  End stage arthritis, right hip(s)  The  patient history, physical examination, clinical judgement of the provider and imaging studies are consistent with end stage degenerative joint disease of the right hip(s) and total hip arthroplasty is deemed medically necessary. The treatment options including medical management, injection therapy, arthroscopy and arthroplasty were discussed at length. The risks and benefits of total hip arthroplasty were presented and reviewed. The risks due to aseptic loosening, infection, stiffness, dislocation/subluxation,  thromboembolic complications and other imponderables were discussed.  The patient acknowledged the explanation, agreed to proceed with the plan and consent was signed. Patient is being admitted for inpatient treatment for surgery, pain control, PT, OT, prophylactic antibiotics, VTE prophylaxis, progressive ambulation and ADL's and discharge planning.The patient is planning to be discharged home with home health services.       [place] Franceen Erisman   PA-C  12/30/2014, 1:09 PM

## 2015-01-05 ENCOUNTER — Other Ambulatory Visit (HOSPITAL_COMMUNITY): Payer: Self-pay | Admitting: *Deleted

## 2015-01-05 NOTE — Patient Instructions (Signed)
James [name]  01/05/2015   Your procedure is scheduled on: 01-11-15  Report to [facility] Main  Entrance take [facility]  elevators to 3rd floor to  [place] at 515 AM.  Call this number if you have problems the morning of surgery [phone]   Remember: ONLY 1 PERSON MAY GO WITH YOU TO SHORT STAY TO GET  READY MORNING OF [place].  Do not eat food or drink liquids :After Midnight.     Take these medicines the morning of surgery with A SIP OF WATER: none                               You may not have any metal on your body including hair pins and              piercings  Do not wear jewelry, make-up, lotions, powders or perfumes, deodorant             Do not wear nail polish.  Do not shave  48 hours prior to surgery.              Men may shave face and neck.   Do not bring valuables to the hospital. [place].  Contacts, dentures or bridgework may not be worn into surgery.  Leave suitcase in the car. After surgery it may be brought to your room.     Patients discharged the day of surgery will not be allowed to drive home.  Name and phone number of your driver:  Special Instructions: N/A              Please read over the following fact sheets you were given: _____________________________________________________________________             [facility] - Preparing for Surgery Before surgery, you can play an important role.  Because skin is not sterile, your skin needs to be as free of germs as possible.  You can reduce the number of germs on your skin by washing with CHG (chlorahexidine gluconate) soap before surgery.  CHG is an antiseptic cleaner which kills germs and bonds with the skin to continue killing germs even after washing. Please DO NOT use if you have an allergy to CHG or antibacterial soaps.  If your skin becomes reddened/irritated stop using the CHG and inform your nurse when you arrive  at Short Stay. Do not shave (including legs and underarms) for at least 48 hours prior to the first CHG shower.  You may shave your face/neck. Please follow these instructions carefully:  1.  Shower with CHG Soap the night before surgery and the  morning of Surgery.  2.  If you choose to wash your hair, wash your hair first as usual with your  normal  shampoo.  3.  After you shampoo, rinse your hair and body thoroughly to remove the  shampoo.                           4.  Use CHG as you would any other liquid soap.  You can apply chg directly  to the skin and wash  Gently with a scrungie or clean washcloth.  5.  Apply the CHG Soap to your body ONLY FROM THE NECK DOWN.   Do not use on face/ open                           Wound or open sores. Avoid contact with eyes, ears mouth and genitals (private parts).                       Wash face,  Genitals (private parts) with your normal soap.             6.  Wash thoroughly, paying special attention to the area where your surgery  will be performed.  7.  Thoroughly rinse your body with warm water from the neck down.  8.  DO NOT shower/wash with your normal soap after using and rinsing off  the CHG Soap.                9.  Pat yourself dry with a clean towel.            10.  Wear clean pajamas.            11.  Place clean sheets on your bed the night of your first shower and do not  sleep with pets. Day of Surgery : Do not apply any lotions/deodorants the morning of surgery.  Please wear clean clothes to the hospital/surgery center.  FAILURE TO FOLLOW THESE INSTRUCTIONS MAY RESULT IN THE CANCELLATION OF YOUR SURGERY PATIENT SIGNATURE_________________________________  NURSE SIGNATURE__________________________________  ________________________________________________________________________   James [name]  An incentive spirometer is a tool that can help keep your lungs clear and active. This tool measures how well you  are filling your lungs with each breath. Taking long deep breaths may help reverse or decrease the chance of developing breathing (pulmonary) problems (especially infection) following:  A long period of time when you are unable to move or be active. BEFORE THE PROCEDURE   If the spirometer includes an indicator to show your best effort, your nurse or respiratory therapist will set it to a desired goal.  If possible, sit up straight or lean slightly forward. Try not to slouch.  Hold the incentive spirometer in an upright position. INSTRUCTIONS FOR USE   Sit on the edge of your bed if possible, or sit up as far as you can in bed or on a chair.  Hold the incentive spirometer in an upright position.  Breathe out normally.  Place the mouthpiece in your mouth and seal your lips tightly around it.  Breathe in slowly and as deeply as possible, raising the piston or the ball toward the top of the column.  Hold your breath for 3-5 seconds or for as long as possible. Allow the piston or ball to fall to the bottom of the column.  Remove the mouthpiece from your mouth and breathe out normally.  Rest for a few seconds and repeat Steps 1 through 7 at least 10 times every 1-2 hours when you are awake. Take your time and take a few normal breaths between deep breaths.  The spirometer may include an indicator to show your best effort. Use the indicator as a goal to work toward during each repetition.  After each set of 10 deep breaths, practice coughing to be sure your lungs are clear. If you have an incision (the cut made at the time of surgery),  support your incision when coughing by placing a pillow or rolled up towels firmly against it. Once you are able to get out of bed, walk around indoors and cough well. You may stop using the incentive spirometer when instructed by your caregiver.  RISKS AND COMPLICATIONS  Take your time so you do not get dizzy or light-headed.  If you are in pain, you may  need to take or ask for pain medication before doing incentive spirometry. It is harder to take a deep breath if you are having pain. AFTER USE  Rest and breathe slowly and easily.  It can be helpful to keep track of a log of your progress. Your caregiver can provide you with a simple table to help with this. If you are using the spirometer at home, follow these instructions: [place] IF:   You are having difficultly using the spirometer.  You have trouble using the spirometer as often as instructed.  Your pain medication is not giving enough relief while using the spirometer.  You develop fever of 100.5 F (38.1 C) or higher. SEEK IMMEDIATE MEDICAL CARE IF:   You cough up bloody sputum that had not been present before.  You develop fever of 102 F (38.9 C) or greater.  You develop worsening pain at or near the incision site. MAKE SURE YOU:   Understand these instructions.  Will watch your condition.  Will get help right away if you are not doing well or get worse. Document Released: 07/09/2006 Document Revised: 05/21/2011 Document Reviewed: 09/09/2006 ExitCare Patient Information 2014 ExitCare, [place].   ________________________________________________________________________  WHAT IS A BLOOD TRANSFUSION? Blood Transfusion Information  A transfusion is the replacement of blood or some of its parts. Blood is made up of multiple cells which provide different functions.  Red blood cells carry oxygen and are used for blood loss replacement.  White blood cells fight against infection.  Platelets control bleeding.  Plasma helps clot blood.  Other blood products are available for specialized needs, such as hemophilia or other clotting disorders. BEFORE THE TRANSFUSION  Who gives blood for transfusions?   Healthy volunteers who are fully evaluated to make sure their blood is safe. This is blood bank blood. Transfusion therapy is the safest it has ever been in  the practice of medicine. Before blood is taken from a donor, a complete history is taken to make sure that person has no history of diseases nor engages in risky social behavior (examples are intravenous drug use or sexual activity with multiple partners). The donor's travel history is screened to minimize risk of transmitting infections, such as malaria. The donated blood is tested for signs of infectious diseases, such as HIV and hepatitis. The blood is then tested to be sure it is compatible with you in order to minimize the chance of a transfusion reaction. If you or a relative donates blood, this is often done in anticipation of surgery and is not appropriate for emergency situations. It takes many days to process the donated blood. RISKS AND COMPLICATIONS Although transfusion therapy is very safe and saves many lives, the main dangers of transfusion include:   Getting an infectious disease.  Developing a transfusion reaction. This is an allergic reaction to something in the blood you were given. Every precaution is taken to prevent this. The decision to have a blood transfusion has been considered carefully by your caregiver before blood is given. Blood is not given unless the benefits outweigh the risks. AFTER THE TRANSFUSION  Right after receiving a blood transfusion, you will usually feel much better and more energetic. This is especially true if your red blood cells have gotten low (anemic). The transfusion raises the level of the red blood cells which carry oxygen, and this usually causes an energy increase.  The nurse administering the transfusion will monitor you carefully for complications. HOME CARE INSTRUCTIONS  No special instructions are needed after a transfusion. You may find your energy is better. Speak with your caregiver about any limitations on activity for underlying diseases you may have. SEEK MEDICAL CARE IF:   Your condition is not improving after your  transfusion.  You develop redness or irritation at the intravenous (IV) site. SEEK IMMEDIATE MEDICAL CARE IF:  Any of the following symptoms occur over the next 12 hours:  Shaking chills.  You have a temperature by mouth above 102 F (38.9 C), not controlled by medicine.  Chest, back, or muscle pain.  People around you feel you are not acting correctly or are confused.  Shortness of breath or difficulty breathing.  Dizziness and fainting.  You get a rash or develop hives.  You have a decrease in urine output.  Your urine turns a dark color or changes to pink, red, or brown. Any of the following symptoms occur over the next 10 days:  You have a temperature by mouth above 102 F (38.9 C), not controlled by medicine.  Shortness of breath.  Weakness after normal activity.  The white part of the eye turns yellow (jaundice).  You have a decrease in the amount of urine or are urinating less often.  Your urine turns a dark color or changes to pink, red, or brown. Document Released: 02/24/2000 Document Revised: 05/21/2011 Document Reviewed: 10/13/2007 [facility] Patient Information 2014 [place], [place].  _______________________________________________________________________

## 2015-01-06 ENCOUNTER — Encounter (HOSPITAL_COMMUNITY)
Admission: RE | Admit: 2015-01-06 | Discharge: 2015-01-06 | Disposition: A | Discharge status: Home / Self Care | Payer: BLUE CROSS/BLUE SHIELD | Source: Ambulatory Visit | Attending: Orthopedic Surgery | Admitting: Orthopedic Surgery

## 2015-01-06 ENCOUNTER — Encounter (HOSPITAL_COMMUNITY): Payer: Self-pay

## 2015-01-06 ENCOUNTER — Other Ambulatory Visit: Payer: Self-pay

## 2015-01-06 ENCOUNTER — Encounter (INDEPENDENT_AMBULATORY_CARE_PROVIDER_SITE_OTHER): Payer: Self-pay

## 2015-01-06 DIAGNOSIS — M1611 Unilateral primary osteoarthritis, right hip: Secondary | ICD-10-CM | POA: Diagnosis not present

## 2015-01-06 DIAGNOSIS — Z01818 Encounter for other preprocedural examination: Secondary | ICD-10-CM | POA: Diagnosis present

## 2015-01-06 HISTORY — DX: Malignant (primary) neoplasm, unspecified: C80.1

## 2015-01-06 HISTORY — DX: Gastro-esophageal reflux disease without esophagitis: K21.9

## 2015-01-06 HISTORY — DX: Palpitations: R00.2

## 2015-01-06 HISTORY — DX: Unspecified osteoarthritis, unspecified site: M19.90

## 2015-01-06 HISTORY — DX: Anemia, unspecified: D64.9

## 2015-01-06 LAB — URINALYSIS, ROUTINE W REFLEX MICROSCOPIC
Bilirubin Urine: NEGATIVE
Glucose, UA: NEGATIVE mg/dL
Hgb urine dipstick: NEGATIVE
Ketones, ur: NEGATIVE mg/dL
Leukocytes, UA: NEGATIVE
Nitrite: NEGATIVE
Protein, ur: NEGATIVE mg/dL
Specific Gravity, Urine: 1.022 (ref 1.005–1.030)
Urobilinogen, UA: 0.2 mg/dL (ref 0.0–1.0)
pH: 5 (ref 5.0–8.0)

## 2015-01-06 LAB — CBC
HCT: 49 % (ref 39.0–52.0)
Hemoglobin: 16.9 g/dL (ref 13.0–17.0)
MCH: 32.1 pg (ref 26.0–34.0)
MCHC: 34.5 g/dL (ref 30.0–36.0)
MCV: 93 fL (ref 78.0–100.0)
Platelets: 245 10*3/uL (ref 150–400)
RBC: 5.27 MIL/uL (ref 4.22–5.81)
RDW: 13.6 % (ref 11.5–15.5)
WBC: 8.6 10*3/uL (ref 4.0–10.5)

## 2015-01-06 LAB — BASIC METABOLIC PANEL
Anion gap: 9 (ref 5–15)
BUN: 29 mg/dL — ABNORMAL HIGH (ref 6–20)
CO2: 22 mmol/L (ref 22–32)
Calcium: 9.2 mg/dL (ref 8.9–10.3)
Chloride: 107 mmol/L (ref 101–111)
Creatinine, Ser: 1.18 mg/dL (ref 0.61–1.24)
GFR calc Af Amer: >60 mL/min (ref >60)
GFR calc non Af Amer: >60 mL/min (ref >60)
Glucose, Bld: 112 mg/dL — ABNORMAL HIGH (ref 65–99)
Potassium: 4.3 mmol/L (ref 3.5–5.1)
Sodium: 138 mmol/L (ref 135–145)

## 2015-01-06 LAB — SURGICAL PCR SCREEN
MRSA, PCR: POSITIVE — AB
Staphylococcus aureus: POSITIVE — AB

## 2015-01-06 LAB — PROTIME-INR
INR: 0.97 (ref 0.00–1.49)
Prothrombin Time: 13.1 seconds (ref 11.6–15.2)

## 2015-01-06 LAB — ABO/RH: ABO/RH(D): A POS

## 2015-01-06 LAB — APTT: aPTT: 27 seconds (ref 24–37)

## 2015-01-07 NOTE — Progress Notes (Signed)
bmet faxed to dr Alvan Dame by epic

## 2015-01-11 ENCOUNTER — Encounter (HOSPITAL_COMMUNITY)
Admission: AD | Disposition: A | Discharge status: Home Health | Payer: Self-pay | Source: Ambulatory Visit | Attending: Orthopedic Surgery

## 2015-01-11 ENCOUNTER — Ambulatory Visit (HOSPITAL_COMMUNITY): Payer: BLUE CROSS/BLUE SHIELD

## 2015-01-11 ENCOUNTER — Ambulatory Visit (HOSPITAL_COMMUNITY): Payer: BLUE CROSS/BLUE SHIELD | Admitting: Anesthesiology

## 2015-01-11 ENCOUNTER — Inpatient Hospital Stay (HOSPITAL_COMMUNITY)
Admission: AD | Admit: 2015-01-11 | Discharge: 2015-01-12 | DRG: 470 | Disposition: A | Discharge status: Home Health | Payer: BLUE CROSS/BLUE SHIELD | Source: Ambulatory Visit | Attending: Orthopedic Surgery | Admitting: Orthopedic Surgery

## 2015-01-11 ENCOUNTER — Encounter (HOSPITAL_COMMUNITY): Payer: Self-pay

## 2015-01-11 ENCOUNTER — Inpatient Hospital Stay (HOSPITAL_COMMUNITY): Payer: BLUE CROSS/BLUE SHIELD

## 2015-01-11 DIAGNOSIS — Z01812 Encounter for preprocedural laboratory examination: Secondary | ICD-10-CM | POA: Diagnosis not present

## 2015-01-11 DIAGNOSIS — K219 Gastro-esophageal reflux disease without esophagitis: Secondary | ICD-10-CM | POA: Diagnosis present

## 2015-01-11 DIAGNOSIS — Z96649 Presence of unspecified artificial hip joint: Secondary | ICD-10-CM

## 2015-01-11 DIAGNOSIS — M1611 Unilateral primary osteoarthritis, right hip: Secondary | ICD-10-CM | POA: Diagnosis present

## 2015-01-11 DIAGNOSIS — F17290 Nicotine dependence, other tobacco product, uncomplicated: Secondary | ICD-10-CM | POA: Diagnosis present

## 2015-01-11 DIAGNOSIS — G4733 Obstructive sleep apnea (adult) (pediatric): Secondary | ICD-10-CM | POA: Diagnosis present

## 2015-01-11 DIAGNOSIS — Z6828 Body mass index (BMI) 28.0-28.9, adult: Secondary | ICD-10-CM | POA: Diagnosis not present

## 2015-01-11 DIAGNOSIS — E663 Overweight: Secondary | ICD-10-CM | POA: Diagnosis present

## 2015-01-11 DIAGNOSIS — B1920 Unspecified viral hepatitis C without hepatic coma: Secondary | ICD-10-CM | POA: Diagnosis present

## 2015-01-11 DIAGNOSIS — M25551 Pain in right hip: Secondary | ICD-10-CM | POA: Diagnosis present

## 2015-01-11 HISTORY — PX: TOTAL HIP ARTHROPLASTY: SHX124

## 2015-01-11 LAB — TYPE AND SCREEN
ABO/RH(D): A POS
Antibody Screen: NEGATIVE

## 2015-01-11 SURGERY — ARTHROPLASTY, HIP, TOTAL, ANTERIOR APPROACH
Anesthesia: General | Site: Hip | Laterality: Right

## 2015-01-11 MED ORDER — MIDAZOLAM HCL 2 MG/2ML IJ SOLN
INTRAMUSCULAR | Status: AC
  Filled 2015-01-11: qty 4

## 2015-01-11 MED ORDER — GLYCOPYRROLATE 0.2 MG/ML IJ SOLN
INTRAMUSCULAR | Status: DC | PRN
  Administered 2015-01-11: 0.6 mg via INTRAVENOUS

## 2015-01-11 MED ORDER — MAGNESIUM CITRATE PO SOLN: 1.0000 | Freq: Once | ORAL | Status: DC | PRN

## 2015-01-11 MED ORDER — ONDANSETRON HCL 4 MG/2ML IJ SOLN
INTRAMUSCULAR | Status: DC | PRN
  Administered 2015-01-11: 4 mg via INTRAVENOUS

## 2015-01-11 MED ORDER — TESTOSTERONE 50 MG/5GM (1%) TD GEL: 5.0000 g | Freq: Every day | TRANSDERMAL | Status: DC

## 2015-01-11 MED ORDER — SODIUM CHLORIDE 0.9 % IJ SOLN
INTRAMUSCULAR | Status: AC
  Filled 2015-01-11: qty 10

## 2015-01-11 MED ORDER — NICOTINE 14 MG/24HR TD PT24
14.0000 mg | MEDICATED_PATCH | Freq: Every day | TRANSDERMAL | Status: DC | PRN
  Filled 2015-01-11: qty 1

## 2015-01-11 MED ORDER — LIDOCAINE HCL (CARDIAC) 20 MG/ML IV SOLN
INTRAVENOUS | Status: AC
  Filled 2015-01-11: qty 5

## 2015-01-11 MED ORDER — METHOCARBAMOL 1000 MG/10ML IJ SOLN
500.0000 mg | Freq: Four times a day (QID) | INTRAVENOUS | Status: DC | PRN
  Filled 2015-01-11: qty 5

## 2015-01-11 MED ORDER — FENTANYL CITRATE (PF) 100 MCG/2ML IJ SOLN
INTRAMUSCULAR | Status: AC
  Filled 2015-01-11: qty 4

## 2015-01-11 MED ORDER — EPHEDRINE SULFATE 50 MG/ML IJ SOLN
INTRAMUSCULAR | Status: DC | PRN
  Administered 2015-01-11: 5 mg via INTRAVENOUS

## 2015-01-11 MED ORDER — ASPIRIN EC 325 MG PO TBEC
325.0000 mg | DELAYED_RELEASE_TABLET | Freq: Two times a day (BID) | ORAL | Status: DC
  Administered 2015-01-12: 325 mg via ORAL
  Filled 2015-01-11 (×3): qty 1

## 2015-01-11 MED ORDER — TRANEXAMIC ACID 1000 MG/10ML IV SOLN
1000.0000 mg | Freq: Once | INTRAVENOUS | Status: AC
  Administered 2015-01-11: 1000 mg via INTRAVENOUS
  Filled 2015-01-11: qty 10

## 2015-01-11 MED ORDER — POLYETHYLENE GLYCOL 3350 17 G PO PACK
17.0000 g | PACK | Freq: Two times a day (BID) | ORAL | Status: DC
  Administered 2015-01-12: 17 g via ORAL

## 2015-01-11 MED ORDER — CEFAZOLIN SODIUM-DEXTROSE 2-3 GM-% IV SOLR
INTRAVENOUS | Status: AC
  Filled 2015-01-11: qty 50

## 2015-01-11 MED ORDER — HYDROMORPHONE HCL 2 MG/ML IJ SOLN
INTRAMUSCULAR | Status: AC
  Filled 2015-01-11: qty 1

## 2015-01-11 MED ORDER — EPHEDRINE SULFATE 50 MG/ML IJ SOLN
INTRAMUSCULAR | Status: AC
  Filled 2015-01-11: qty 1

## 2015-01-11 MED ORDER — HYDROMORPHONE HCL 1 MG/ML IJ SOLN
0.2500 mg | INTRAMUSCULAR | Status: DC | PRN
  Administered 2015-01-11 (×3): 0.5 mg via INTRAVENOUS

## 2015-01-11 MED ORDER — DEXAMETHASONE SODIUM PHOSPHATE 10 MG/ML IJ SOLN
INTRAMUSCULAR | Status: AC
  Filled 2015-01-11: qty 1

## 2015-01-11 MED ORDER — ONDANSETRON HCL 4 MG/2ML IJ SOLN
INTRAMUSCULAR | Status: AC
  Filled 2015-01-11: qty 2

## 2015-01-11 MED ORDER — DEXAMETHASONE SODIUM PHOSPHATE 10 MG/ML IJ SOLN
10.0000 mg | Freq: Once | INTRAMUSCULAR | Status: AC
  Administered 2015-01-11: 10 mg via INTRAVENOUS

## 2015-01-11 MED ORDER — PROPOFOL 10 MG/ML IV BOLUS
INTRAVENOUS | Status: AC
  Filled 2015-01-11: qty 20

## 2015-01-11 MED ORDER — DEXAMETHASONE SODIUM PHOSPHATE 10 MG/ML IJ SOLN
10.0000 mg | Freq: Once | INTRAMUSCULAR | Status: AC
  Administered 2015-01-12: 10 mg via INTRAVENOUS
  Filled 2015-01-11: qty 1

## 2015-01-11 MED ORDER — LACTATED RINGERS IV SOLN
INTRAVENOUS | Status: DC
  Administered 2015-01-11: 1000 mL via INTRAVENOUS

## 2015-01-11 MED ORDER — MENTHOL 3 MG MT LOZG: 1.0000 | LOZENGE | OROMUCOSAL | Status: DC | PRN

## 2015-01-11 MED ORDER — HYDROMORPHONE HCL 1 MG/ML IJ SOLN
INTRAMUSCULAR | Status: AC
  Administered 2015-01-11: 0.5 mg via INTRAVENOUS
  Filled 2015-01-11: qty 1

## 2015-01-11 MED ORDER — CEFAZOLIN SODIUM-DEXTROSE 2-3 GM-% IV SOLR
2.0000 g | Freq: Four times a day (QID) | INTRAVENOUS | Status: AC
  Administered 2015-01-11 (×2): 2 g via INTRAVENOUS
  Filled 2015-01-11 (×2): qty 50

## 2015-01-11 MED ORDER — FERROUS SULFATE 325 (65 FE) MG PO TABS
325.0000 mg | ORAL_TABLET | Freq: Three times a day (TID) | ORAL | Status: DC
  Administered 2015-01-11 – 2015-01-12 (×2): 325 mg via ORAL
  Filled 2015-01-11 (×5): qty 1

## 2015-01-11 MED ORDER — METOCLOPRAMIDE HCL 5 MG/ML IJ SOLN: 5.0000 mg | Freq: Three times a day (TID) | INTRAMUSCULAR | Status: DC | PRN

## 2015-01-11 MED ORDER — CEFAZOLIN SODIUM-DEXTROSE 2-3 GM-% IV SOLR: 2.0000 g | Freq: Once | INTRAVENOUS | Status: DC

## 2015-01-11 MED ORDER — METHOCARBAMOL 500 MG PO TABS
500.0000 mg | ORAL_TABLET | Freq: Four times a day (QID) | ORAL | Status: DC | PRN
  Administered 2015-01-11 – 2015-01-12 (×3): 500 mg via ORAL
  Filled 2015-01-11 (×3): qty 1

## 2015-01-11 MED ORDER — BISACODYL 10 MG RE SUPP: 10.0000 mg | Freq: Every day | RECTAL | Status: DC | PRN

## 2015-01-11 MED ORDER — ALUM & MAG HYDROXIDE-SIMETH 200-200-20 MG/5ML PO SUSP: 30.0000 mL | ORAL | Status: DC | PRN

## 2015-01-11 MED ORDER — DIPHENHYDRAMINE HCL 25 MG PO CAPS: 25.0000 mg | ORAL_CAPSULE | Freq: Four times a day (QID) | ORAL | Status: DC | PRN

## 2015-01-11 MED ORDER — PROPOFOL 10 MG/ML IV BOLUS
INTRAVENOUS | Status: DC | PRN
  Administered 2015-01-11: 200 mg via INTRAVENOUS

## 2015-01-11 MED ORDER — METOCLOPRAMIDE HCL 10 MG PO TABS: 5.0000 mg | ORAL_TABLET | Freq: Three times a day (TID) | ORAL | Status: DC | PRN

## 2015-01-11 MED ORDER — ONDANSETRON HCL 4 MG/2ML IJ SOLN: 4.0000 mg | Freq: Four times a day (QID) | INTRAMUSCULAR | Status: DC | PRN

## 2015-01-11 MED ORDER — ROCURONIUM BROMIDE 100 MG/10ML IV SOLN
INTRAVENOUS | Status: DC | PRN
  Administered 2015-01-11: 10 mg via INTRAVENOUS
  Administered 2015-01-11: 50 mg via INTRAVENOUS
  Administered 2015-01-11: 10 mg via INTRAVENOUS

## 2015-01-11 MED ORDER — CELECOXIB 200 MG PO CAPS
200.0000 mg | ORAL_CAPSULE | Freq: Two times a day (BID) | ORAL | Status: DC
  Administered 2015-01-11 – 2015-01-12 (×2): 200 mg via ORAL
  Filled 2015-01-11 (×3): qty 1

## 2015-01-11 MED ORDER — DOCUSATE SODIUM 100 MG PO CAPS
100.0000 mg | ORAL_CAPSULE | Freq: Two times a day (BID) | ORAL | Status: DC
  Administered 2015-01-11 – 2015-01-12 (×2): 100 mg via ORAL

## 2015-01-11 MED ORDER — ONDANSETRON HCL 4 MG PO TABS: 4.0000 mg | ORAL_TABLET | Freq: Four times a day (QID) | ORAL | Status: DC | PRN

## 2015-01-11 MED ORDER — SODIUM CHLORIDE 0.9 % IV SOLN
100.0000 mL/h | INTRAVENOUS | Status: DC
  Administered 2015-01-11: 100 mL/h via INTRAVENOUS
  Filled 2015-01-11 (×3): qty 1000

## 2015-01-11 MED ORDER — PHENOL 1.4 % MT LIQD: 1.0000 | OROMUCOSAL | Status: DC | PRN

## 2015-01-11 MED ORDER — VANCOMYCIN HCL IN DEXTROSE 1-5 GM/200ML-% IV SOLN
1000.0000 mg | Freq: Once | INTRAVENOUS | Status: AC
  Administered 2015-01-11: 1000 mg via INTRAVENOUS
  Filled 2015-01-11: qty 200

## 2015-01-11 MED ORDER — HYDROMORPHONE HCL 1 MG/ML IJ SOLN
0.5000 mg | INTRAMUSCULAR | Status: DC | PRN
  Administered 2015-01-11 – 2015-01-12 (×5): 1 mg via INTRAVENOUS
  Filled 2015-01-11 (×5): qty 1

## 2015-01-11 MED ORDER — FENTANYL CITRATE (PF) 100 MCG/2ML IJ SOLN
INTRAMUSCULAR | Status: DC | PRN
  Administered 2015-01-11 (×4): 50 ug via INTRAVENOUS
  Administered 2015-01-11: 100 ug via INTRAVENOUS
  Administered 2015-01-11: 50 ug via INTRAVENOUS

## 2015-01-11 MED ORDER — PROMETHAZINE HCL 25 MG/ML IJ SOLN: 6.2500 mg | INTRAMUSCULAR | Status: DC | PRN

## 2015-01-11 MED ORDER — VALACYCLOVIR HCL 500 MG PO TABS
1000.0000 mg | ORAL_TABLET | Freq: Every day | ORAL | Status: DC | PRN
  Filled 2015-01-11: qty 2

## 2015-01-11 MED ORDER — SODIUM CHLORIDE 0.9 % IR SOLN
Status: DC | PRN
  Administered 2015-01-11: 1000 mL

## 2015-01-11 MED ORDER — FENTANYL CITRATE (PF) 250 MCG/5ML IJ SOLN
INTRAMUSCULAR | Status: AC
  Filled 2015-01-11: qty 25

## 2015-01-11 MED ORDER — NEOSTIGMINE METHYLSULFATE 10 MG/10ML IV SOLN
INTRAVENOUS | Status: DC | PRN
  Administered 2015-01-11: 4 mg via INTRAVENOUS

## 2015-01-11 MED ORDER — LIDOCAINE HCL (CARDIAC) 20 MG/ML IV SOLN
INTRAVENOUS | Status: DC | PRN
  Administered 2015-01-11: 100 mg via INTRAVENOUS

## 2015-01-11 MED ORDER — HYDROCODONE-ACETAMINOPHEN 7.5-325 MG PO TABS
1.0000 | ORAL_TABLET | ORAL | Status: DC
  Administered 2015-01-11 – 2015-01-12 (×6): 2 via ORAL
  Filled 2015-01-11 (×6): qty 2

## 2015-01-11 MED ORDER — MIDAZOLAM HCL 5 MG/5ML IJ SOLN
INTRAMUSCULAR | Status: DC | PRN
  Administered 2015-01-11: 2 mg via INTRAVENOUS

## 2015-01-11 MED ORDER — CEFAZOLIN SODIUM-DEXTROSE 2-3 GM-% IV SOLR
2.0000 g | INTRAVENOUS | Status: AC
  Administered 2015-01-11: 2 g via INTRAVENOUS

## 2015-01-11 MED ORDER — HYDROMORPHONE HCL 1 MG/ML IJ SOLN
INTRAMUSCULAR | Status: DC | PRN
  Administered 2015-01-11: 0.5 mg via INTRAVENOUS
  Administered 2015-01-11: 1 mg via INTRAVENOUS
  Administered 2015-01-11: 0.5 mg via INTRAVENOUS
  Administered 2015-01-11 (×2): 1 mg via INTRAVENOUS

## 2015-01-11 MED ORDER — LACTATED RINGERS IV SOLN
INTRAVENOUS | Status: DC | PRN
  Administered 2015-01-11 (×2): via INTRAVENOUS

## 2015-01-11 SURGICAL SUPPLY — 31 items
BAG DECANTER FOR FLEXI CONT (MISCELLANEOUS) IMPLANT
BAG ZIPLOCK 12X15 (MISCELLANEOUS) IMPLANT
CAPT HIP TOTAL 2 ×2 IMPLANT
CLOTH BEACON ORANGE TIMEOUT ST (SAFETY) ×2 IMPLANT
COVER PERINEAL POST (MISCELLANEOUS) ×2 IMPLANT
DRAPE STERI IOBAN 125X83 (DRAPES) ×2 IMPLANT
DRAPE U-SHAPE 47X51 STRL (DRAPES) ×4 IMPLANT
DRSG AQUACEL AG ADV 3.5X10 (GAUZE/BANDAGES/DRESSINGS) ×2 IMPLANT
DURAPREP 26ML APPLICATOR (WOUND CARE) ×2 IMPLANT
ELECT REM PT RETURN 15FT ADLT (MISCELLANEOUS) IMPLANT
ELECT REM PT RETURN 9FT ADLT (ELECTROSURGICAL) ×2
ELECTRODE REM PT RTRN 9FT ADLT (ELECTROSURGICAL) ×1 IMPLANT
GLOVE BIOGEL PI IND STRL 7.5 (GLOVE) ×1 IMPLANT
GLOVE BIOGEL PI IND STRL 8.5 (GLOVE) ×1 IMPLANT
GLOVE BIOGEL PI INDICATOR 7.5 (GLOVE) ×1
GLOVE BIOGEL PI INDICATOR 8.5 (GLOVE) ×1
GLOVE ECLIPSE 8.0 STRL XLNG CF (GLOVE) ×4 IMPLANT
GLOVE ORTHO TXT STRL SZ7.5 (GLOVE) ×2 IMPLANT
GOWN SPEC L3 XXLG W/TWL (GOWN DISPOSABLE) ×2 IMPLANT
GOWN STRL REUS W/TWL LRG LVL3 (GOWN DISPOSABLE) ×2 IMPLANT
HOLDER FOLEY CATH W/STRAP (MISCELLANEOUS) ×2 IMPLANT
LIQUID BAND (GAUZE/BANDAGES/DRESSINGS) ×2 IMPLANT
SAW OSC TIP CART 19.5X105X1.3 (SAW) ×2 IMPLANT
SUT MNCRL AB 4-0 PS2 18 (SUTURE) ×2 IMPLANT
SUT VIC AB 1 CT1 36 (SUTURE) ×6 IMPLANT
SUT VIC AB 2-0 CT1 27 (SUTURE) ×2
SUT VIC AB 2-0 CT1 TAPERPNT 27 (SUTURE) ×2 IMPLANT
SUT VLOC 180 0 24IN GS25 (SUTURE) ×2 IMPLANT
SYR 50ML LL SCALE MARK (SYRINGE) IMPLANT
TRAY FOLEY W/METER SILVER 16FR (SET/KITS/TRAYS/PACK) ×2 IMPLANT
WATER STERILE IRR 1500ML POUR (IV SOLUTION) ×2 IMPLANT

## 2015-01-11 NOTE — Anesthesia Postprocedure Evaluation (Signed)
  Anesthesia Post-op Note  Patient: James [name]  Procedure(s) Performed: Procedure(s) (LRB): RIGHT TOTAL HIP ARTHROPLASTY ANTERIOR APPROACH (Right)  Patient Location: PACU  Anesthesia Type: General  Level of Consciousness: awake and alert   Airway and Oxygen Therapy: Patient Spontanous Breathing  Post-op Pain: mild  Post-op Assessment: Post-op Vital signs reviewed, Patient's Cardiovascular Status Stable, Respiratory Function Stable, Patent Airway and No signs of Nausea or vomiting  Last Vitals:  Filed Vitals:   01/11/15 1412  BP: 160/87  Pulse: 88  Temp: 36.9 C  Resp: 15    Post-op Vital Signs: stable   Complications: No apparent anesthesia complications

## 2015-01-11 NOTE — Progress Notes (Signed)
Patient state he has seen a dentist and has a tooth that needs to be pulled. Has not been abscessed or been on antibiotics for it

## 2015-01-11 NOTE — Progress Notes (Signed)
Pt placed in room 1618 from PACU. Report received from Onalee Hua. Patient was rude to RN and argumentative. He stated, "Now, let me tell you something right now, I'm not going to be in pain, so just keep bringing me medication." RN explained to patient that surgery is painful and we do the best we can to control pain with the medication ordered. Patient was argumentative and stated that he "better not be." RN felt uncomfortable and mentioned the incidence to the charge nurse, Danna.

## 2015-01-11 NOTE — Transfer of Care (Signed)
Immediate Anesthesia Transfer of Care Note  Patient: James [name]  Procedure(s) Performed: Procedure(s): RIGHT TOTAL HIP ARTHROPLASTY ANTERIOR APPROACH (Right)  Patient Location: PACU  Anesthesia Type:General  Level of Consciousness: awake, alert  and oriented  Airway & Oxygen Therapy: Patient Spontanous Breathing and Patient connected to face mask oxygen  Post-op Assessment: Report given to RN and Post -op Vital signs reviewed and stable  Post vital signs: Reviewed and stable  Last Vitals:  Filed Vitals:   01/11/15 0513  BP: 132/85  Pulse: 93  Temp: 36.8 C  Resp: 18    Complications: No apparent anesthesia complications

## 2015-01-11 NOTE — Progress Notes (Signed)
No antibiotic orders this am. Call into Dr Alvan Dame

## 2015-01-11 NOTE — Anesthesia Procedure Notes (Signed)
Procedure Name: Intubation Date/Time: 01/11/2015 7:20 AM Performed by: Glory Buff Pre-anesthesia Checklist: Patient identified, Emergency Drugs available, Suction available and Patient being monitored Patient Re-evaluated:Patient Re-evaluated prior to inductionOxygen Delivery Method: Circle System Utilized Preoxygenation: Pre-oxygenation with 100% oxygen Intubation Type: IV induction Ventilation: Mask ventilation without difficulty Laryngoscope Size: Miller and 3 Grade View: Grade I Tube type: Oral Tube size: 7.5 mm Number of attempts: 1 Airway Equipment and Method: Stylet and Oral airway Placement Confirmation: ETT inserted through vocal cords under direct vision,  positive ETCO2 and breath sounds checked- equal and bilateral Secured at: 22 cm Tube secured with: Tape Dental Injury: Teeth and Oropharynx as per pre-operative assessment

## 2015-01-11 NOTE — Evaluation (Signed)
Physical Therapy Evaluation Patient Details Name: James [name] MRN: [id number] DOB: 1960/03/17 Today's Date: 01/11/2015   History of Present Illness  s/p R DA THA  Clinical Impression  Pt admitted with above diagnosis. Pt currently with functional limitations due to the deficits listed below (see PT Problem List).  Pt will benefit from skilled PT to increase their independence and safety with mobility to allow discharge to the venue listed below.  Pt planning for HHPT, should progress well; amb 120' today with RW and min/guard assist; will need to practice stairs in am, likely D/C tomorrow     Follow Up Recommendations Home health PT    Equipment Recommendations  Rolling walker with 5" wheels;3in1 (PT)    Recommendations for Other Services       Precautions / Restrictions Restrictions Other Position/Activity Restrictions: WBAT      Mobility  Bed Mobility Overal bed mobility: Needs Assistance Bed Mobility: Supine to Sit     Supine to sit: Min assist     General bed mobility comments: cues  for  technique, assist for RLE  Transfers Overall transfer level: Needs assistance Equipment used: Rolling walker (2 wheeled) Transfers: Sit to/from Stand           General transfer comment: cues for hand placement and RLE position  Ambulation/Gait Ambulation/Gait assistance: Min assist;Min guard Ambulation Distance (Feet): 120 Feet Assistive device: Rolling walker (2 wheeled) Gait Pattern/deviations: Step-through pattern;Step-to pattern;Antalgic     General Gait Details: cues for sequence and RW safety  Stairs            Wheelchair Mobility    Modified Rankin (Stroke Patients Only)       Balance                                             Pertinent Vitals/Pain Pain Assessment: 0-10 Pain Score: 7  Pain Location: right hip Pain Intervention(s): Limited activity within patient's tolerance;Monitored during session;Premedicated before  session;Repositioned;Ice applied    Home Living Family/patient expects to be discharged to:: Private residence Living Arrangements: Alone   Type of Home: House Home Access: Stairs to enter Entrance Stairs-Rails: Psychiatric nurse of Steps: 4-5  Home Layout: One level Home Equipment: None Additional Comments: dtr and girlfriend assisting for a few days to weeks    Prior Function Level of Independence: Independent               Hand Dominance        Extremity/Trunk Assessment   Upper Extremity Assessment: Defer to OT evaluation;Overall WFL for tasks assessed           Lower Extremity Assessment: RLE deficits/detail RLE Deficits / Details: ankle WFL, knee and hip grossly 3/5, AROM limited by pain       Communication   Communication: No difficulties  Cognition Arousal/Alertness: Awake/alert Behavior During Therapy: WFL for tasks assessed/performed Overall Cognitive Status: Within Functional Limits for tasks assessed                      General Comments      Exercises Total Joint Exercises Ankle Circles/Pumps: AROM;Both;10 reps Quad Sets: 10 reps;Right;Strengthening      Assessment/Plan    PT Assessment Patient needs continued PT services  PT Diagnosis Difficulty walking   PT Problem List Decreased strength;Decreased activity tolerance;Decreased balance;Decreased range of motion;Decreased mobility;Decreased knowledge of  use of DME;Pain  PT Treatment Interventions DME instruction;Gait training;Stair training;Functional mobility training;Therapeutic activities;Patient/family education;Therapeutic exercise   PT Goals (Current goals can be found in the Care Plan section) Acute Rehab PT Goals Patient Stated Goal: hike in [place] PT Goal Formulation: With patient Time For Goal Achievement: 01/14/15 Potential to Achieve Goals: Good    Frequency 7X/week   Barriers to discharge        Co-evaluation               End of  Session Equipment Utilized During Treatment: Gait belt Activity Tolerance: Patient tolerated treatment well Patient left: in chair;with call bell/phone within reach;with chair alarm set Nurse Communication: Mobility status         Time: 1439-1510 PT Time Calculation (min) (ACUTE ONLY): 31 min   Charges:   PT Evaluation $Initial PT Evaluation Tier I: 1 Procedure PT Treatments $Gait Training: 8-22 mins   PT G Codes:        Nataleigh Griffin 02-03-2015, 3:29 PM

## 2015-01-11 NOTE — Anesthesia Preprocedure Evaluation (Addendum)
Anesthesia Evaluation  Patient identified by MRN, date of birth, ID band Patient awake    Reviewed: Allergy & Precautions, NPO status , Patient's Chart, lab work & pertinent test results  Airway Mallampati: II  TM Distance: >3 FB Neck ROM: Full    Dental  (+)    Pulmonary sleep apnea , Current Smoker,  Diagnosed with OSA. No CPAP. No current symptoms of severe OSA.   Pulmonary exam normal breath sounds clear to auscultation       Cardiovascular negative cardio ROS Normal cardiovascular exam Rhythm:Regular Rate:Normal     Neuro/Psych negative neurological ROS  negative psych ROS   GI/Hepatic GERD  ,(+) Hepatitis -, C  Endo/Other  negative endocrine ROS  Renal/GU negative Renal ROS  negative genitourinary   Musculoskeletal  (+) Arthritis ,   Abdominal   Peds negative pediatric ROS (+)  Hematology  (+) anemia ,   Anesthesia Other Findings   Reproductive/Obstetrics negative OB ROS                           Anesthesia Physical Anesthesia Plan  ASA: II  Anesthesia Plan: General   Post-op Pain Management:    Induction: Intravenous  Airway Management Planned: Oral ETT  Additional Equipment:   Intra-op Plan:   Post-operative Plan: Extubation in OR  Informed Consent: I have reviewed the patients History and Physical, chart, labs and discussed the procedure including the risks, benefits and alternatives for the proposed anesthesia with the patient or authorized representative who has indicated his/her understanding and acceptance.   Dental advisory given  Plan Discussed with: CRNA  Anesthesia Plan Comments: (Discussed risks and benefits of and differences between spinal and general. Discussed risks of spinal including headache, backache, failure, bleeding and hematoma, infection, and nerve damage. . Questions answered. Coagulation studies and platelet count acceptable.  Patient  prefers general.)       Anesthesia Quick Evaluation

## 2015-01-11 NOTE — Interval H&P Note (Signed)
History and Physical Interval Note:  01/11/2015 6:51 AM  James [name]  has presented today for surgery, with the diagnosis of right hip osteoarthritis  The various methods of treatment have been discussed with the patient and family. After consideration of risks, benefits and other options for treatment, the patient has consented to  Procedure(s): RIGHT TOTAL HIP ARTHROPLASTY ANTERIOR APPROACH (Right) as a surgical intervention .  The patient's history has been reviewed, patient examined, no change in status, stable for surgery.  I have reviewed the patient's chart and labs.  Questions were answered to the patient's satisfaction.     Mauri Pole

## 2015-01-11 NOTE — Progress Notes (Signed)
Pt placed on the sleep apnea protocol. Patient refuses to wear O2 which causes the machine to alarm. Patient upset that nursing will not turn off the alarm. Informed patient on why this protocol is in place and that his safety is our priority. Patient still refuses to wear the O2 and probe was removed. Will continue to monitor patient.

## 2015-01-11 NOTE — Discharge Instructions (Signed)

## 2015-01-11 NOTE — Op Note (Signed)
NAME:  James [name]                ACCOUNT NO.: [id number]      MEDICAL RECORD NO.: [id number]      FACILITY:  [facility]      PHYSICIAN:  Paralee Cancel D  DATE OF BIRTH:  02-15-1961     DATE OF PROCEDURE:  01/11/2015                                 OPERATIVE REPORT         PREOPERATIVE DIAGNOSIS: Right  hip osteoarthritis.      POSTOPERATIVE DIAGNOSIS:  Right hip osteoarthritis.      PROCEDURE:  Right total hip replacement through an anterior approach   utilizing DePuy THR system, component size 66mm pinnacle cup, a size 36+4 neutral   Altrex liner, a size 3 HiTri Lock stem with a 36+1.5 delta ceramic   ball.      SURGEON:  Pietro Cassis. Alvan Dame, M.D.      ASSISTANT:  Danae Orleans, PA-C     ANESTHESIA:  Spinal.      SPECIMENS:  None.      COMPLICATIONS:  None.      BLOOD LOSS:  400 cc     DRAINS:  None.      INDICATION OF THE PROCEDURE:  James [name] is a 54 y.o. male who had   presented to office for evaluation of right hip pain.  Radiographs revealed   progressive degenerative changes with bone-on-bone   articulation to the  hip joint.  The patient had painful limited range of   motion significantly affecting their overall quality of life.  The patient was failing to    respond to conservative measures, and at this point was ready   to proceed with more definitive measures.  The patient has noted progressive   degenerative changes in his hip, progressive problems and dysfunction   with regarding the hip prior to surgery.  Consent was obtained for   benefit of pain relief.  Specific risk of infection, DVT, component   failure, dislocation, need for revision surgery, as well discussion of   the anterior versus posterior approach were reviewed.  Consent was   obtained for benefit of anterior pain relief through an anterior   approach.      PROCEDURE IN DETAIL:  The patient was brought to operative theater.   Once adequate anesthesia, preoperative  antibiotics, 2gm of Ancef, (plus 1 gm of Vancomycin - due to MRSA positive screen), 1 gm of Tranexamic Acid, and 10 mg of Decadron administered.   The patient was positioned supine on the OSI Hanna table.  Once adequate   padding of boney process was carried out, we had predraped out the hip, and  used fluoroscopy to confirm orientation of the pelvis and position.      The right hip was then prepped and draped from proximal iliac crest to   mid thigh with shower curtain technique.      Time-out was performed identifying the patient, planned procedure, and   extremity.     An incision was then made 2 cm distal and lateral to the   anterior superior iliac spine extending over the orientation of the   tensor fascia lata muscle and sharp dissection was carried down to the   fascia of the muscle and protractor placed in the soft tissues.  The fascia was then incised.  The muscle belly was identified and swept   laterally and retractor placed along the superior neck.  Following   cauterization of the circumflex vessels and removing some pericapsular   fat, a second cobra retractor was placed on the inferior neck.  A third   retractor was placed on the anterior acetabulum after elevating the   anterior rectus.  A L-capsulotomy was along the line of the   superior neck to the trochanteric fossa, then extended proximally and   distally.  Tag sutures were placed and the retractors were then placed   intracapsular.  We then identified the trochanteric fossa and   orientation of my neck cut, confirmed this radiographically   and then made a neck osteotomy with the femur on traction.  The femoral   head was removed without difficulty or complication.  Traction was let   off and retractors were placed posterior and anterior around the   acetabulum.      The labrum and foveal tissue were debrided.  I began reaming with a 23mm   reamer and reamed up to 71mm reamer with good bony bed preparation  and a 9mm   cup was chosen.  The final 21mm Pinnacle cup was then impacted under fluoroscopy  to confirm the depth of penetration and orientation with respect to   abduction.  A screw was placed followed by the hole eliminator.  The final   36+4 neutral Altrex liner was impacted with good visualized rim fit.  The cup was positioned anatomically within the acetabular portion of the pelvis.      At this point, the femur was rolled at 80 degrees.  Further capsule was   released off the inferior aspect of the femoral neck.  I then   released the superior capsule proximally.  The hook was placed laterally   along the femur and elevated manually and held in position with the bed   hook.  The leg was then extended and adducted with the leg rolled to 100   degrees of external rotation.  Once the proximal femur was fully   exposed, I used a box osteotome to set orientation.  I then began   broaching with the starting chili pepper broach and passed this by hand and then broached up to 3.  With the 3 broach in place I chose a high offset neck and did a trial reduction.  The offset was appropriate, leg lengths   appeared to be equal, confirmed radiographically.   Given these findings, I went ahead and dislocated the hip, repositioned all   retractors and positioned the right hip in the extended and abducted position.  The final 3Hi Tri Lock stem was   chosen and it was impacted down to the level of neck cut.  Based on this   and the trial reduction, a 36+1.5 delta ceramic ball was chosen and   impacted onto a clean and dry trunnion, and the hip was reduced.  The   hip had been irrigated throughout the case again at this point.  I did   reapproximate the superior capsular leaflet to the anterior leaflet   using #1 Vicryl.  The fascia of the   tensor fascia lata muscle was then reapproximated using #1 Vicryl and #0 V-lock sutures.  The   remaining wound was closed with 2-0 Vicryl and running 4-0 Monocryl.    The hip was cleaned, dried, and dressed sterilely using Dermabond and  Aquacel dressing.  He was then brought   to recovery room in stable condition tolerating the procedure well.    Danae Orleans, PA-C was present for the entirety of the case involved from   preoperative positioning, perioperative retractor management, general   facilitation of the case, as well as primary wound closure as assistant.            Pietro Cassis Alvan Dame, M.D.        01/11/2015 8:44 AM

## 2015-01-12 DIAGNOSIS — E663 Overweight: Secondary | ICD-10-CM | POA: Diagnosis present

## 2015-01-12 LAB — BASIC METABOLIC PANEL
Anion gap: 5 (ref 5–15)
BUN: 17 mg/dL (ref 6–20)
CO2: 28 mmol/L (ref 22–32)
Calcium: 8.8 mg/dL — ABNORMAL LOW (ref 8.9–10.3)
Chloride: 103 mmol/L (ref 101–111)
Creatinine, Ser: 1.02 mg/dL (ref 0.61–1.24)
GFR calc Af Amer: >60 mL/min (ref >60)
GFR calc non Af Amer: >60 mL/min (ref >60)
Glucose, Bld: 136 mg/dL — ABNORMAL HIGH (ref 65–99)
Potassium: 4 mmol/L (ref 3.5–5.1)
Sodium: 136 mmol/L (ref 135–145)

## 2015-01-12 LAB — CBC
HCT: 40.1 % (ref 39.0–52.0)
Hemoglobin: 13.4 g/dL (ref 13.0–17.0)
MCH: 30.9 pg (ref 26.0–34.0)
MCHC: 33.4 g/dL (ref 30.0–36.0)
MCV: 92.4 fL (ref 78.0–100.0)
Platelets: 204 10*3/uL (ref 150–400)
RBC: 4.34 MIL/uL (ref 4.22–5.81)
RDW: 13.5 % (ref 11.5–15.5)
WBC: 11.8 10*3/uL — ABNORMAL HIGH (ref 4.0–10.5)

## 2015-01-12 MED ORDER — FERROUS SULFATE 325 (65 FE) MG PO TABS: 325.0000 mg | ORAL_TABLET | Freq: Three times a day (TID) | ORAL | Status: DC

## 2015-01-12 MED ORDER — HYDROCODONE-ACETAMINOPHEN 7.5-325 MG PO TABS: 1.0000 | ORAL_TABLET | ORAL | Status: DC | PRN

## 2015-01-12 MED ORDER — METHOCARBAMOL 500 MG PO TABS: 500.0000 mg | ORAL_TABLET | Freq: Four times a day (QID) | ORAL | Status: DC | PRN

## 2015-01-12 MED ORDER — ASPIRIN 325 MG PO TBEC: 325.0000 mg | DELAYED_RELEASE_TABLET | Freq: Two times a day (BID) | ORAL | Status: AC

## 2015-01-12 MED ORDER — DOCUSATE SODIUM 100 MG PO CAPS: 100.0000 mg | ORAL_CAPSULE | Freq: Two times a day (BID) | ORAL | Status: DC

## 2015-01-12 MED ORDER — POLYETHYLENE GLYCOL 3350 17 G PO PACK: 17.0000 g | PACK | Freq: Two times a day (BID) | ORAL | Status: DC

## 2015-01-12 NOTE — Progress Notes (Signed)
     Subjective: 1 Day Post-Op Procedure(s) (LRB): RIGHT TOTAL HIP ARTHROPLASTY ANTERIOR APPROACH (Right)   Patient reports pain as mild, pain controlled. States that he was in a lot of pain yesterday, but much better today.  We have reviewed the procedure and recovery time.  No events throughout the night.  Ready to be discharged home.  Objective:   VITALS:   Filed Vitals:   01/12/15 0524  BP: 116/86  Pulse: 74  Temp: 98.2 F (36.8 C)  Resp: 15    Dorsiflexion/Plantar flexion intact Incision: dressing C/D/I No cellulitis present Compartment soft  LABS  Recent Labs  01/12/15 0430  HGB 13.4  HCT 40.1  WBC 11.8*  PLT 204     Recent Labs  01/12/15 0430  NA 136  K 4.0  BUN 17  CREATININE 1.02  GLUCOSE 136*     Assessment/Plan: 1 Day Post-Op Procedure(s) (LRB): RIGHT TOTAL HIP ARTHROPLASTY ANTERIOR APPROACH (Right) Foley cath d/c'ed yesterday Advance diet Up with therapy D/C IV fluids Discharge home with home health  Follow up in 2 weeks at [facility]. Follow up with OLIN,Cathan Gearin D in 2 weeks.  Contact information:  [facility] [address] [place] [phone]    Overweight (BMI 25-29.9) Estimated body mass index is 28.55 kg/(m^2) as calculated from the following:   Height as of this encounter: 5\' 10"  (1.778 m).   Weight as of this encounter: 90.266 kg (199 lb). Patient also counseled that weight may inhibit the healing process Patient counseled that losing weight will help with future health issues      [place] Pugh. Khushi Zupko   PAC  01/12/2015, 9:30 AM

## 2015-01-12 NOTE — Care Management Note (Signed)
Case Management Note  Patient Details  Name: Nishant Schrecengost MRN: [id number] Date of Birth: 1960-03-15  Subjective/Objective:                  RIGHT TOTAL HIP ARTHROPLASTY ANTERIOR APPROACH (Right)   Action/Plan:  Discharge planning Expected Discharge Date:  01/12/15               Expected Discharge Plan:  [place]  In-House Referral:     Discharge planning Services  CM Consult  Post Acute Care Choice:  Home Health Choice offered to:  Patient  DME Arranged:  Walker rolling DME Agency:  [place]:  PT [facility] Agency:  [place]  Status of Service:  Completed, signed off  Medicare Important Message Given:    Date Medicare IM Given:    Medicare IM give by:    Date Additional Medicare IM Given:    Additional Medicare Important Message give by:     If discussed at [name] of Stay Meetings, dates discussed:    Additional Comments: Utilization Review complete. CM met with pt in room to offer choice of home health agency.  Pt chooses [facility] to render HHPT.  Address is [address] [place] , [place] [address].  Referral called to [facility] rep, Tim with appropriate address.  CM called [facility] DME rep, Lecretia to please deliver the rolling walker to room prior to discharge.  Pt expressed dissatisfaction insurance will not cover both cane and rolling walker but states he'll pick one up at the store.  No other CM needs were communicated. Dellie Catholic, RN 01/12/2015, 1:04 PM

## 2015-01-12 NOTE — Evaluation (Addendum)
Occupational Therapy Evaluation Patient Details Name: James [name] MRN: [id number] DOB: [birth year] Today's Date: 01/12/2015    History of Present Illness s/p R DA THA   Clinical Impression   Pt seen for OT assessment followed by ADL retraining session to include bed mobility, toilet transfers, education & use of  A/E for LB dressing/ADL's and demonstration/simulation of shower transfer using RW. Pt declined need for 3:1. Pt will have duaghter & girlfriend for next week to 10 days and they will assist with ADL's PRN. Will sign off OT at this time.    Follow Up Recommendations  No OT follow up;Supervision - Intermittent    Equipment Recommendations  Other (comment) (Pt declined 3:1; is considering hip kit for long handled  A/E)    Recommendations for Other Services       Precautions / Restrictions Restrictions Weight Bearing Restrictions: Yes Other Position/Activity Restrictions: WBAT      Mobility Bed Mobility Overal bed mobility: Needs Assistance Bed Mobility: Supine to Sit     Supine to sit: Supervision     General bed mobility comments: Cues for technique and LLE to assist with RLE - no hands on assist by therapist. Increased time.  Transfers Overall transfer level: Needs assistance Equipment used: Rolling walker (2 wheeled) Transfers: Sit to/from [facility] Sit to Stand: Supervision Stand pivot transfers: Supervision       General transfer comment: cues for hand placement and RLE position    Balance Overall balance assessment: No apparent balance deficits (not formally assessed)                                          ADL Overall ADL's : Needs assistance/impaired Eating/Feeding: Independent;Sitting   Grooming: Wash/dry hands;Standing;Supervision/safety   Upper Body Bathing: Modified independent;Sitting;Set up   Lower Body Bathing: Sit to/from stand;Min guard   Upper Body Dressing : Modified independent;Set  up;Sitting   Lower Body Dressing: Sit to/from stand;Min guard   Toilet Transfer: Supervision/safety;RW;Ambulation Armed forces technical officer Details (indicate cue type and reason): Min verbal cues for RW placement Toileting- Clothing Manipulation and Hygiene: Supervision/safety;Sit to/from stand   Tub/ Shower Transfer: Walk-in shower;Min guard;Ambulation;Rolling walker (Simulation and verbal education/demonstration)   Functional mobility during ADLs: Supervision/safety;Rolling walker (Min VC's for RW placement as pt tends to move RW off to side and reach (for sink, commode). Pt able to correct after cues) General ADL Comments: Pt seen for OT assessment followed by ADL retraining session to include bed mobility, toilet transfers, education & use of  A/E for LB dressing/ADL's and demonstration/simulation of shower transfer usinf RW. Pt declined need for 3:1. Pt will have duaghter & girlfriend for next week to 10 days and they will assist with ADL's PRN. Will sign off OT at this time.     Vision  No change from baseline   Perception     Praxis      Pertinent Vitals/Pain Pain Assessment: 0-10 Pain Score: 4  Pain Location: Right hip Pain Descriptors / Indicators: Sore;Aching Pain Intervention(s): Monitored during session;Premedicated before session;Repositioned     Hand Dominance Right   Extremity/Trunk Assessment Upper Extremity Assessment Upper Extremity Assessment: Overall WFL for tasks assessed   Lower Extremity Assessment Lower Extremity Assessment: Defer to PT evaluation       Communication Communication Communication: No difficulties   Cognition Arousal/Alertness: Awake/alert Behavior During Therapy: WFL for tasks assessed/performed Overall Cognitive Status: Within  Functional Limits for tasks assessed                     General Comments       Exercises       Shoulder Instructions      Home Living Family/patient expects to be discharged to:: Private  residence Living Arrangements: Alone Available Help at Discharge: Family Type of Home: House Home Access: Stairs to enter Technical brewer of Steps: 4-5  Entrance Stairs-Rails: Right;Left Home Layout: One level     Bathroom Shower/Tub: Occupational psychologist: Standard Bathroom Accessibility: Yes How Accessible: Accessible via walker Home Equipment: None   Additional Comments: dtr and girlfriend assisting for a few days to weeks      Prior Functioning/Environment Level of Independence: Independent             OT Diagnosis: Acute pain   OT Problem List:     OT Treatment/Interventions:      OT Goals(Current goals can be found in the care plan section) Acute Rehab OT Goals OT Goal Formulation: All assessment and education complete, DC therapy  OT Frequency:     Barriers to D/C:            Co-evaluation              End of Session Equipment Utilized During Treatment: Rolling walker  Activity Tolerance: Patient tolerated treatment well Patient left: in chair;with call bell/phone within reach   Time: 0906-0933 OT Time Calculation (min): 27 min Charges:  OT General Charges $OT Visit: 1 Procedure OT Evaluation $Initial OT Evaluation Tier I: 1 Procedure OT Treatments $Self Care/Home Management : 8-22 mins G-Codes:    Josephine Igo Dixon, OTR/L 01/12/2015, 10:51 AM

## 2015-01-12 NOTE — Progress Notes (Signed)
Physical Therapy Treatment Patient Details Name: James [name] MRN: [id number] DOB: 06-20-1960 Today's Date: 01/12/2015    History of Present Illness s/p R DA THA    PT Comments    Pt performed stair training and exercise program with  detailed  Instructions for HEP. His pain level is much less today and he was able to weight bear with min increase in pain. Answered all pt's questions; he is ready for d/c to continue rehab with HHPT.    Follow Up Recommendations  Home health PT     Equipment Recommendations  Rolling walker with 5" wheels;3in1 (PT)    Recommendations for Other Services       Precautions / Restrictions Restrictions Weight Bearing Restrictions: No Other Position/Activity Restrictions: WBAT    Mobility  Bed Mobility Overal bed mobility: Needs Assistance Bed Mobility: Supine to Sit     Supine to sit: Supervision     General bed mobility comments: Pt up oob upon arrival   Transfers Overall transfer level: Modified independent Equipment used: Rolling walker (2 wheeled) Transfers: Sit to/from Stand Sit to Stand: Modified independent (Device/Increase time) Stand pivot transfers: Supervision       General transfer comment: cues for hand placement and RLE position  Ambulation/Gait Ambulation/Gait assistance: Supervision Ambulation Distance (Feet): 35 Feet Assistive device: Rolling walker (2 wheeled) Gait Pattern/deviations: Step-through pattern;Antalgic     General Gait Details: min cues for safety and using caution    Stairs Stairs: Yes Stairs assistance: Supervision Stair Management: One rail Right;Step to pattern;Sideways Number of Stairs: 4 General stair comments: cues for safety technique, LE progression, and use of UE  Wheelchair Mobility    Modified Rankin (Stroke Patients Only)       Balance Overall balance assessment: No apparent balance deficits (not formally assessed)                                   Cognition Arousal/Alertness: Awake/alert Behavior During Therapy: WFL for tasks assessed/performed Overall Cognitive Status: Within Functional Limits for tasks assessed                      Exercises Total Joint Exercises Ankle Circles/Pumps: AROM;Both;15 reps Quad Sets: Strengthening;Right;15 reps Towel Squeeze: Strengthening;Both;15 reps Short Arc Quad: AROM;Strengthening;Right;10 reps Heel Slides: AROM;Right;15 reps Hip ABduction/ADduction: AROM;Both;15 reps Straight Leg Raises: AROM;Right;5 reps Long Arc Quad: AROM;Right;5 reps Marching in Standing: AROM;Strengthening;Both;15 reps Standing Hip Extension: AROM;Strengthening;Right;15 reps Other Exercises Other Exercises: Pt was instructed to perform all standing exercises holding on to either his walker or a counter top at home and sitting exercises  with both LEs supported on either a bed or a chair with leg support     General Comments        Pertinent Vitals/Pain Pain Assessment: Faces Faces Pain Scale: Hurts a little bit Pain Location: R hip Pain Descriptors / Indicators: Grimacing Pain Intervention(s): Limited activity within patient's tolerance;Monitored during session;Premedicated before session;Repositioned    Home Living Family/patient expects to be discharged to:: Private residence Living Arrangements: Alone Available Help at Discharge: Family Type of Home: House Home Access: Stairs to enter Entrance Stairs-Rails: Right;Left Home Layout: One level Home Equipment: None Additional Comments: dtr and girlfriend assisting for a few days to weeks    Prior Function Level of Independence: Independent          PT Goals (current goals can now be found in the care plan section)  Acute Rehab PT Goals Patient Stated Goal: hike in [place] PT Goal Formulation: With patient Time For Goal Achievement: 01/14/15 Potential to Achieve Goals: Good Progress towards PT goals: Progressing toward goals    Frequency   7X/week    PT Plan Current plan remains appropriate    Co-evaluation             End of Session Equipment Utilized During Treatment: Gait belt Activity Tolerance: Patient tolerated treatment well Patient left: in chair;with call bell/phone within reach     Time: 1026-1059 PT Time Calculation (min) (ACUTE ONLY): 33 min  Charges:  $Gait Training: 8-22 mins $Therapeutic Exercise: 8-22 mins                    G Codes:      James [name] 01-21-2015, 1:26 PM

## 2015-01-24 NOTE — Discharge Summary (Signed)
Physician Discharge Summary  Patient ID: James [name] MRN: [id number] DOB/AGE: 56/08/1960 56 y.o.  Admit date: 01/11/2015 Discharge date: 01/12/2015   Procedures:  Procedure(s) (LRB): RIGHT TOTAL HIP ARTHROPLASTY ANTERIOR APPROACH (Right)  Attending Physician:  Dr. Paralee Cancel   Admission Diagnoses:   Right hip primary OA / pain  Discharge Diagnoses:  Principal Problem:   S/P right THA, AA Active Problems:   Overweight (BMI 25.0-29.9)  Past Medical History  Diagnosis Date  . Palpitations     at times   . GERD (gastroesophageal reflux disease)   . Arthritis     oa  . Cancer ([place]) 4 months ago    basal cell skin cancer removed from chest area  . Hepatitis C 04/29/2012    had savalta treatment 2 years ago, now cured  . Anemia     slight anemia with hepatitis tx    HPI:    James [name], 54 y.o. male, has a history of pain and functional disability in the right hip(s) due to arthritis and patient has failed non-surgical conservative treatments for greater than 12 weeks to include NSAID's and/or analgesics, corticosteriod injections and activity modification. Onset of symptoms was gradual starting 1.5+ years ago with gradually worsening course since that time.The patient noted no past surgery on the right hip(s). Patient currently rates pain in the right hip at 8 out of 10 with activity. Patient has worsening of pain with activity and weight bearing, trendelenberg gait, pain that interfers with activities of daily living, pain with passive range of motion, crepitus and joint swelling. Patient has evidence of periarticular osteophytes and joint space narrowing by imaging studies. This condition presents safety issues increasing the risk of falls. There is no current active infection. Risks, benefits and expectations were discussed with the patient. Risks including but not limited to the risk of anesthesia, blood clots, nerve damage, blood vessel damage, failure of the  prosthesis, infection and up to and including death. Patient understand the risks, benefits and expectations and wishes to proceed with surgery.   PCP: No PCP Per Patient   Discharged Condition: good  Hospital Course:  Patient underwent the above stated procedure on 01/11/2015. Patient tolerated the procedure well and brought to the recovery room in good condition and subsequently to the floor.  POD #1 BP: 116/86 ; Pulse: 74 ; Temp: 98.2 F (36.8 C) ; Resp: 15 Patient reports pain as mild, pain controlled. States that he was in a lot of pain yesterday, but much better today. We have reviewed the procedure and recovery time. No events throughout the night. Ready to be discharged home. Dorsiflexion/plantar flexion intact, incision: dressing C/D/I, no cellulitis present and compartment soft.   LABS  Basename    HGB  13.4  HCT  40.1    Discharge Exam: General appearance: alert, cooperative and no distress Extremities: Homans sign is negative, no sign of DVT, no edema, redness or tenderness in the calves or thighs and no ulcers, gangrene or trophic changes  Disposition: Home with follow up in 2 weeks   Follow-up Information    Follow up with Mauri Pole, MD. Schedule an appointment as soon as possible for a visit in 2 weeks.   Specialty:  Orthopedic Surgery   Contact information:   [address] [place] [address] [phone]       Follow up with [facility].   Why:  home health physical therapy   Contact information:   [place]  [address] [place] [address] [phone]       Follow up with [facility].   Why:  rolling walker   Contact information:   [address] [place] [address] [phone]       Discharge Instructions    Call MD / Call 911    Complete by:  As directed   If you experience chest pain or shortness of breath, CALL 911 and be transported to the hospital emergency room.  If you  develope a fever above 101 F, pus (white drainage) or increased drainage or redness at the wound, or calf pain, call your surgeon's office.     Change dressing    Complete by:  As directed   Maintain surgical dressing until follow up in the clinic. If the edges start to pull up, may reinforce with tape. If the dressing is no longer working, may remove and cover with gauze and tape, but must keep the area dry and clean.  Call with any questions or concerns.     Constipation Prevention    Complete by:  As directed   Drink plenty of fluids.  Prune juice may be helpful.  You may use a stool softener, such as Colace (over the counter) 100 mg twice a day.  Use MiraLax (over the counter) for constipation as needed.     Diet - low sodium heart healthy    Complete by:  As directed      Discharge instructions    Complete by:  As directed   Maintain surgical dressing until follow up in the clinic. If the edges start to pull up, may reinforce with tape. If the dressing is no longer working, may remove and cover with gauze and tape, but must keep the area dry and clean.  Follow up in 2 weeks at [facility]. Call with any questions or concerns.     Increase activity slowly as tolerated    Complete by:  As directed   Weight bearing as tolerated with assist device (walker, cane, etc) as directed, use it as long as suggested by your surgeon or therapist, typically at least 4-6 weeks.     TED hose    Complete by:  As directed   Use stockings (TED hose) for 2 weeks on both leg(s).  You may remove them at night for sleeping.             Medication List    STOP taking these medications        aspirin 325 MG tablet  Replaced by:  aspirin 325 MG EC tablet     diclofenac 75 MG EC tablet  Commonly known as:  VOLTAREN     meloxicam 7.5 MG tablet  Commonly known as:  MOBIC      TAKE these medications        aspirin 325 MG EC tablet  Take 1 tablet (325 mg total) by mouth 2 (two) times daily.      docusate sodium 100 MG capsule  Commonly known as:  COLACE  Take 1 capsule (100 mg total) by mouth 2 (two) times daily.     ferrous sulfate 325 (65 FE) MG tablet  Take 1 tablet (325 mg total) by mouth 3 (three) times daily after meals.     HYDROcodone-acetaminophen 7.5-325 MG tablet  Commonly known as:  NORCO  Take 1-2 tablets by mouth every 4 (four) hours as needed for moderate pain.     methocarbamol  500 MG tablet  Commonly known as:  ROBAXIN  Take 1 tablet (500 mg total) by mouth every 6 (six) hours as needed for muscle spasms.     polyethylene glycol packet  Commonly known as:  MIRALAX / GLYCOLAX  Take 17 g by mouth 2 (two) times daily.     TESTIM 50 MG/5GM (1%) Gel  Generic drug:  testosterone  Place 5 g onto the skin daily. Apply to chest     valACYclovir 1000 MG tablet  Commonly known as:  VALTREX  Take 1,000 mg by mouth daily as needed (for fever blisters).         Signed: [place] Pugh. Adonay Scheier   PA-C  01/24/2015, 9:15 AM

## 2015-03-23 ENCOUNTER — Other Ambulatory Visit (HOSPITAL_BASED_OUTPATIENT_CLINIC_OR_DEPARTMENT_OTHER): Payer: BLUE CROSS/BLUE SHIELD

## 2015-03-23 ENCOUNTER — Ambulatory Visit: Payer: BLUE CROSS/BLUE SHIELD | Admitting: Internal Medicine

## 2015-03-23 ENCOUNTER — Telehealth: Payer: Self-pay | Admitting: Internal Medicine

## 2015-03-23 NOTE — Telephone Encounter (Signed)
Returned patient call re r/s 1/11 appointments. Gave patient new appointment for 1/30.

## 2015-04-11 ENCOUNTER — Encounter: Payer: Self-pay | Admitting: Internal Medicine

## 2015-04-11 ENCOUNTER — Other Ambulatory Visit: Payer: BLUE CROSS/BLUE SHIELD

## 2015-04-11 ENCOUNTER — Ambulatory Visit (HOSPITAL_BASED_OUTPATIENT_CLINIC_OR_DEPARTMENT_OTHER): Payer: BLUE CROSS/BLUE SHIELD

## 2015-04-11 ENCOUNTER — Ambulatory Visit (HOSPITAL_BASED_OUTPATIENT_CLINIC_OR_DEPARTMENT_OTHER): Payer: BLUE CROSS/BLUE SHIELD | Admitting: Internal Medicine

## 2015-04-11 ENCOUNTER — Telehealth: Payer: Self-pay | Admitting: Internal Medicine

## 2015-04-11 VITALS — BP 127/87 | HR 78 | Temp 98.1°F | Resp 16 | Ht 70.0 in | Wt 199.4 lb

## 2015-04-11 DIAGNOSIS — D751 Secondary polycythemia: Secondary | ICD-10-CM

## 2015-04-11 LAB — COMPREHENSIVE METABOLIC PANEL
ALT: 25 U/L (ref 0–55)
AST: 23 U/L (ref 5–34)
Albumin: 3.8 g/dL (ref 3.5–5.0)
Alkaline Phosphatase: 63 U/L (ref 40–150)
Anion Gap: 9 mEq/L (ref 3–11)
BUN: 25.6 mg/dL (ref 7.0–26.0)
CO2: 21 mEq/L — ABNORMAL LOW (ref 22–29)
Calcium: 9.6 mg/dL (ref 8.4–10.4)
Chloride: 109 mEq/L (ref 98–109)
Creatinine: 1.1 mg/dL (ref 0.7–1.3)
EGFR: 75 mL/min/{1.73_m2} — ABNORMAL LOW (ref >90)
Glucose: 99 mg/dl (ref 70–140)
Potassium: 4.5 mEq/L (ref 3.5–5.1)
Sodium: 140 mEq/L (ref 136–145)
Total Bilirubin: 0.42 mg/dL (ref 0.20–1.20)
Total Protein: 7.2 g/dL (ref 6.4–8.3)

## 2015-04-11 LAB — CBC WITH DIFFERENTIAL/PLATELET
BASO%: 0.7 % (ref 0.0–2.0)
Basophils Absolute: 0 10*3/uL (ref 0.0–0.1)
EOS%: 3.4 % (ref 0.0–7.0)
Eosinophils Absolute: 0.2 10*3/uL (ref 0.0–0.5)
HCT: 52.6 % — ABNORMAL HIGH (ref 38.4–49.9)
HGB: 17.4 g/dL — ABNORMAL HIGH (ref 13.0–17.1)
LYMPH%: 25.2 % (ref 14.0–49.0)
MCH: 29.5 pg (ref 27.2–33.4)
MCHC: 33.2 g/dL (ref 32.0–36.0)
MCV: 89 fL (ref 79.3–98.0)
MONO#: 0.6 10*3/uL (ref 0.1–0.9)
MONO%: 9.3 % (ref 0.0–14.0)
NEUT#: 4.2 10*3/uL (ref 1.5–6.5)
NEUT%: 61.4 % (ref 39.0–75.0)
Platelets: 205 10*3/uL (ref 140–400)
RBC: 5.91 10*6/uL — ABNORMAL HIGH (ref 4.20–5.82)
RDW: 14.2 % (ref 11.0–14.6)
WBC: 6.8 10*3/uL (ref 4.0–10.3)
lymph#: 1.7 10*3/uL (ref 0.9–3.3)

## 2015-04-11 LAB — LACTATE DEHYDROGENASE: LDH: 196 U/L (ref 125–245)

## 2015-04-11 NOTE — Telephone Encounter (Signed)
Spoke with patient re appointments for March 27th - patient to get avs report in inf area.

## 2015-04-11 NOTE — Progress Notes (Signed)
[facility] phlebotomy performed today.  500 ml of blood drained off, patient tolerated well.  Snack provided, patient rested for thirty minutes.  VS stable, patient stated the he felt "fine."  Patient released.

## 2015-04-11 NOTE — Patient Instructions (Signed)

## 2015-04-11 NOTE — Progress Notes (Signed)
[facility] 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   PRIOR THERAPY: None   CURRENT THERAPY: Phlebotomy on an as-needed basis noted to keep the hematocrit at 45% or less. He is scheduled for one today.  INTERVAL HISTORY: James [name] 55 y.o. male returns to the clinic today for routine 3 months followup visit. The patient is feeling fine with no complaints today. He underwent right hip surgery on 01/11/2015. He is recovering well from his surgery. He also still on testosterone gel by his urologist. He denied having any significant fatigue or weakness. He denied having any weight loss or night sweats. He has no chest pain, shortness of breath, cough or hemoptysis. He denied having any significant bleeding issues. He has repeat CBC and comprehensive metabolic panel performed earlier today and he is here for evaluation and discussion of his lab results.  MEDICAL HISTORY: Past Medical History  Diagnosis Date  . Palpitations     at times   . GERD (gastroesophageal reflux disease)   . Arthritis     oa  . Cancer ([place]) 4 months ago    basal cell skin cancer removed from chest area  . Hepatitis C 04/29/2012    had savalta treatment 2 years ago, now cured  . Anemia     slight anemia with hepatitis tx    ALLERGIES:  has No Known Allergies.  MEDICATIONS:  Current Outpatient Prescriptions  Medication Sig Dispense Refill  . docusate sodium (COLACE) 100 MG capsule Take 1 capsule (100 mg total) by mouth 2 (two) times daily. 10 capsule 0  . ferrous sulfate 325 (65 FE) MG tablet Take 1 tablet (325 mg total) by mouth 3 (three) times daily after meals.  3  . HYDROcodone-acetaminophen (NORCO) 7.5-325 MG tablet Take 1-2 tablets by mouth every 4 (four) hours as needed for moderate pain. 80 tablet 0  . methocarbamol  (ROBAXIN) 500 MG tablet Take 1 tablet (500 mg total) by mouth every 6 (six) hours as needed for muscle spasms. 40 tablet 0  . polyethylene glycol (MIRALAX / GLYCOLAX) packet Take 17 g by mouth 2 (two) times daily. 14 each 0  . TESTIM 50 MG/5GM GEL Place 5 g onto the skin daily. Apply to chest    . valACYclovir (VALTREX) 1000 MG tablet Take 1,000 mg by mouth daily as needed (for fever blisters).   2   No current facility-administered medications for this visit.    REVIEW OF SYSTEMS:  A comprehensive review of systems was negative.   PHYSICAL EXAMINATION: General appearance: alert, cooperative and no distress Head: Normocephalic, without obvious abnormality, atraumatic Neck: no adenopathy, no JVD, supple, symmetrical, trachea midline and thyroid not enlarged, symmetric, no tenderness/mass/nodules Lymph nodes: Cervical, supraclavicular, and axillary nodes normal. Resp: clear to auscultation bilaterally Back: symmetric, no curvature. ROM normal. No CVA tenderness. Cardio: regular rate and rhythm, S1, S2 normal, no murmur, click, rub or gallop GI: soft, non-tender; bowel sounds normal; no masses,  no organomegaly Extremities: extremities normal, atraumatic, no cyanosis or edema  ECOG PERFORMANCE STATUS: 0 - Asymptomatic  Blood pressure 127/87, pulse 78, temperature 98.1 F (36.7 C), temperature source Oral, resp. rate 16, height 5\' 10"  (1.778 m), weight 199 lb 6.4 oz (90.447 kg), SpO2 98 %.  LABORATORY DATA: Lab Results  Component Value Date   WBC 6.8  04/11/2015   HGB 17.4* 04/11/2015   HCT 52.6* 04/11/2015   MCV 89.0 04/11/2015   PLT 205 04/11/2015      Chemistry      Component Value Date/Time   NA 136 01/12/2015 0430   NA 141 09/20/2014 1514   K 4.0 01/12/2015 0430   K 4.1 09/20/2014 1514   CL 103 01/12/2015 0430   CL 106 08/06/2012 1536   CO2 28 01/12/2015 0430   CO2 22 09/20/2014 1514   BUN 17 01/12/2015 0430   BUN 25.4 09/20/2014 1514   CREATININE 1.02 01/12/2015 0430     CREATININE 1.1 09/20/2014 1514      Component Value Date/Time   CALCIUM 8.8* 01/12/2015 0430   CALCIUM 8.9 09/20/2014 1514   ALKPHOS 50 09/20/2014 1514   ALKPHOS 51 08/22/2011 0923   AST 20 09/20/2014 1514   AST 27 08/22/2011 0923   ALT 25 09/20/2014 1514   ALT 33 08/22/2011 0923   BILITOT 0.39 09/20/2014 1514   BILITOT 0.5 08/22/2011 0923       RADIOGRAPHIC STUDIES: No results found.  ASSESSMENT AND PLAN: This is a very pleasant 55 years old white male with reactive polycythemia secondary to hormonal treatment and currently on treatment with phlebotomy as needed basis to keep his hematocrit close to 45%. The patient is doing fine today. His hematocrit is elevated today to 52.6%. I recommended for the patient to proceed with phlebotomy today. He would come back for follow-up visit in 2 months with repeat CBC and comprehensive metabolic panel and phlebotomy as needed. He was advised to call immediately if he has any concerning symptoms in the interval. The patient voices understanding of current disease status and treatment options and is in agreement with the current care plan.  All questions were answered. The patient knows to call the clinic with any problems, questions or concerns. We can certainly see the patient much sooner if necessary.  Disclaimer: This note was dictated with voice recognition software. Similar sounding words can inadvertently be transcribed and may not be corrected upon review.

## 2015-06-06 ENCOUNTER — Ambulatory Visit: Payer: BLUE CROSS/BLUE SHIELD | Admitting: Internal Medicine

## 2015-06-06 ENCOUNTER — Other Ambulatory Visit: Payer: BLUE CROSS/BLUE SHIELD

## 2015-06-15 ENCOUNTER — Other Ambulatory Visit (HOSPITAL_BASED_OUTPATIENT_CLINIC_OR_DEPARTMENT_OTHER): Payer: BLUE CROSS/BLUE SHIELD

## 2015-06-15 ENCOUNTER — Telehealth: Payer: Self-pay | Admitting: Internal Medicine

## 2015-06-15 ENCOUNTER — Telehealth: Payer: Self-pay | Admitting: *Deleted

## 2015-06-15 ENCOUNTER — Ambulatory Visit (HOSPITAL_BASED_OUTPATIENT_CLINIC_OR_DEPARTMENT_OTHER): Payer: BLUE CROSS/BLUE SHIELD

## 2015-06-15 ENCOUNTER — Ambulatory Visit (HOSPITAL_BASED_OUTPATIENT_CLINIC_OR_DEPARTMENT_OTHER): Payer: BLUE CROSS/BLUE SHIELD | Admitting: Internal Medicine

## 2015-06-15 ENCOUNTER — Encounter: Payer: Self-pay | Admitting: Internal Medicine

## 2015-06-15 VITALS — BP 150/89 | HR 86 | Temp 98.4°F | Resp 18 | Ht 70.0 in | Wt 196.1 lb

## 2015-06-15 DIAGNOSIS — I498 Other specified cardiac arrhythmias: Secondary | ICD-10-CM | POA: Diagnosis not present

## 2015-06-15 DIAGNOSIS — D751 Secondary polycythemia: Secondary | ICD-10-CM

## 2015-06-15 DIAGNOSIS — I10 Essential (primary) hypertension: Secondary | ICD-10-CM

## 2015-06-15 DIAGNOSIS — Z8249 Family history of ischemic heart disease and other diseases of the circulatory system: Secondary | ICD-10-CM | POA: Diagnosis not present

## 2015-06-15 LAB — COMPREHENSIVE METABOLIC PANEL
ALT: 28 U/L (ref 0–55)
AST: 23 U/L (ref 5–34)
Albumin: 3.7 g/dL (ref 3.5–5.0)
Alkaline Phosphatase: 69 U/L (ref 40–150)
Anion Gap: 8 mEq/L (ref 3–11)
BUN: 30.7 mg/dL — ABNORMAL HIGH (ref 7.0–26.0)
CO2: 24 mEq/L (ref 22–29)
Calcium: 9.7 mg/dL (ref 8.4–10.4)
Chloride: 109 mEq/L (ref 98–109)
Creatinine: 1.3 mg/dL (ref 0.7–1.3)
EGFR: 58 mL/min/{1.73_m2} — ABNORMAL LOW (ref >90)
Glucose: 97 mg/dl (ref 70–140)
Potassium: 4.3 mEq/L (ref 3.5–5.1)
Sodium: 142 mEq/L (ref 136–145)
Total Bilirubin: 0.31 mg/dL (ref 0.20–1.20)
Total Protein: 7.4 g/dL (ref 6.4–8.3)

## 2015-06-15 LAB — CBC WITH DIFFERENTIAL/PLATELET
BASO%: 0.2 % (ref 0.0–2.0)
Basophils Absolute: 0 10*3/uL (ref 0.0–0.1)
EOS%: 2.1 % (ref 0.0–7.0)
Eosinophils Absolute: 0.2 10*3/uL (ref 0.0–0.5)
HCT: 50.3 % — ABNORMAL HIGH (ref 38.4–49.9)
HGB: 17 g/dL (ref 13.0–17.1)
LYMPH%: 22.3 % (ref 14.0–49.0)
MCH: 29.9 pg (ref 27.2–33.4)
MCHC: 33.8 g/dL (ref 32.0–36.0)
MCV: 88.6 fL (ref 79.3–98.0)
MONO#: 0.7 10*3/uL (ref 0.1–0.9)
MONO%: 8.8 % (ref 0.0–14.0)
NEUT#: 5.5 10*3/uL (ref 1.5–6.5)
NEUT%: 66.6 % (ref 39.0–75.0)
Platelets: 211 10*3/uL (ref 140–400)
RBC: 5.68 10*6/uL (ref 4.20–5.82)
RDW: 14.8 % — ABNORMAL HIGH (ref 11.0–14.6)
WBC: 8.3 10*3/uL (ref 4.0–10.3)
lymph#: 1.8 10*3/uL (ref 0.9–3.3)

## 2015-06-15 NOTE — Progress Notes (Signed)
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   PRIOR THERAPY: None   CURRENT THERAPY: Phlebotomy on an as-needed basis noted to keep the hematocrit at 45% or less. He is scheduled for one today.  INTERVAL HISTORY: James [name] 55 y.o. male returns to the clinic today for routine 2 months followup visit. The patient is feeling fine with no complaints today. He has occasional flutter his chest. He was seen by his primary care physician and EKG was performed and showed no abnormalities. He has a family history of heart disease at early age. He also still on testosterone gel by his urologist. He denied having any significant fatigue or weakness. He denied having any weight loss or night sweats. He has no chest pain, shortness of breath, cough or hemoptysis. He denied having any significant bleeding issues. He has repeat CBC and comprehensive metabolic panel performed earlier today and he is here for evaluation and discussion of his lab results.  MEDICAL HISTORY: Past Medical History  Diagnosis Date  . Palpitations     at times   . GERD (gastroesophageal reflux disease)   . Arthritis     oa  . Cancer ([place]) 4 months ago    basal cell skin cancer removed from chest area  . Hepatitis C 04/29/2012    had savalta treatment 2 years ago, now cured  . Anemia     slight anemia with hepatitis tx    ALLERGIES:  has No Known Allergies.  MEDICATIONS:  Current Outpatient Prescriptions  Medication Sig Dispense Refill  . TESTIM 50 MG/5GM GEL Place 5 g onto the skin daily. Apply to chest    . valACYclovir (VALTREX) 1000 MG tablet Take 1,000 mg by mouth daily as needed (for fever blisters).   2   No current facility-administered medications for this visit.    REVIEW OF SYSTEMS:  A comprehensive review of  systems was negative.   PHYSICAL EXAMINATION: General appearance: alert, cooperative and no distress Head: Normocephalic, without obvious abnormality, atraumatic Neck: no adenopathy, no JVD, supple, symmetrical, trachea midline and thyroid not enlarged, symmetric, no tenderness/mass/nodules Lymph nodes: Cervical, supraclavicular, and axillary nodes normal. Resp: clear to auscultation bilaterally Back: symmetric, no curvature. ROM normal. No CVA tenderness. Cardio: regular rate and rhythm, S1, S2 normal, no murmur, click, rub or gallop GI: soft, non-tender; bowel sounds normal; no masses,  no organomegaly Extremities: extremities normal, atraumatic, no cyanosis or edema  ECOG PERFORMANCE STATUS: 0 - Asymptomatic  Blood pressure 150/89, pulse 86, temperature 98.4 F (36.9 C), temperature source Oral, resp. rate 18, height 5\' 10"  (1.778 m), weight 196 lb 1.6 oz (88.95 kg), SpO2 98 %.  LABORATORY DATA: Lab Results  Component Value Date   WBC 8.3 06/15/2015   HGB 17.0 06/15/2015   HCT 50.3* 06/15/2015   MCV 88.6 06/15/2015   PLT 211 06/15/2015      Chemistry      Component Value Date/Time   NA 140 04/11/2015 0811   NA 136 01/12/2015 0430   K 4.5 04/11/2015 0811   K 4.0 01/12/2015 0430   CL 103 01/12/2015 0430   CL 106 08/06/2012 1536   CO2 21* 04/11/2015 0811   CO2 28 01/12/2015 0430   BUN 25.6 04/11/2015 0811   BUN 17 01/12/2015 0430   CREATININE 1.1 04/11/2015  [id number]   CREATININE 1.02 01/12/2015 0430      Component Value Date/Time   CALCIUM 9.6 04/11/2015 0811   CALCIUM 8.8* 01/12/2015 0430   ALKPHOS 63 04/11/2015 0811   ALKPHOS 51 08/22/2011 0923   AST 23 04/11/2015 0811   AST 27 08/22/2011 0923   ALT 25 04/11/2015 0811   ALT 33 08/22/2011 0923   BILITOT 0.42 04/11/2015 0811   BILITOT 0.5 08/22/2011 0923       RADIOGRAPHIC STUDIES: No results found.  ASSESSMENT AND PLAN: This is a very pleasant 55 years old white male with reactive polycythemia secondary to  hormonal treatment and currently on treatment with phlebotomy as needed basis to keep his hematocrit close to 45%. The patient is doing fine today. His hematocrit is elevated today to 50.3%. I recommended for the patient to proceed with phlebotomy today. [name] with repeat CBC and comprehensive metabolic panel and phlebotomy as needed. For the hypertension and occasional flutter in his chest, I recommended for the patient to see his primary care physician and consider referral to a cardiologist for evaluation of his condition. He was advised to call immediately if he has any concerning symptoms in the interval. The patient voices understanding of current disease status and treatment options and is in agreement with the current care plan.  All questions were answered. The patient knows to call the clinic with any problems, questions or concerns. We can certainly see the patient much sooner if necessary.  Disclaimer: This note was dictated with voice recognition software. Similar sounding words can inadvertently be transcribed and may not be corrected upon review.

## 2015-06-15 NOTE — Patient Instructions (Signed)

## 2015-06-15 NOTE — Telephone Encounter (Signed)
per pof to sch pt appt-gave pt copy of avs-sent MW email to sch phlebotomy-pt to get updated copy b4 leaving

## 2015-06-15 NOTE — Telephone Encounter (Signed)
Per staff message and POF I have scheduled appts. Advised scheduler of appts. JMW  

## 2015-06-15 NOTE — Progress Notes (Signed)
[place] phlebotomy performed. Drained aproximately 500 ml of blood, patient tolerated well using phlebotomy kit thru right arm ac. Snack provided, patient rested for thirty minutes. VS stable, patient stated the he felt "fine." Patient released.

## 2015-06-23 DIAGNOSIS — R002 Palpitations: Secondary | ICD-10-CM | POA: Diagnosis not present

## 2015-06-27 DIAGNOSIS — J039 Acute tonsillitis, unspecified: Secondary | ICD-10-CM | POA: Diagnosis not present

## 2015-07-10 DIAGNOSIS — J029 Acute pharyngitis, unspecified: Secondary | ICD-10-CM | POA: Diagnosis not present

## 2015-08-02 DIAGNOSIS — Z0000 Encounter for general adult medical examination without abnormal findings: Secondary | ICD-10-CM | POA: Diagnosis not present

## 2015-09-10 DIAGNOSIS — R109 Unspecified abdominal pain: Secondary | ICD-10-CM | POA: Diagnosis not present

## 2015-09-10 DIAGNOSIS — K5900 Constipation, unspecified: Secondary | ICD-10-CM | POA: Diagnosis not present

## 2015-09-13 DIAGNOSIS — Z2089 Contact with and (suspected) exposure to other communicable diseases: Secondary | ICD-10-CM | POA: Diagnosis not present

## 2015-09-14 ENCOUNTER — Ambulatory Visit: Payer: BLUE CROSS/BLUE SHIELD | Admitting: Internal Medicine

## 2015-09-14 ENCOUNTER — Other Ambulatory Visit: Payer: BLUE CROSS/BLUE SHIELD

## 2015-09-14 ENCOUNTER — Telehealth: Payer: Self-pay | Admitting: *Deleted

## 2015-09-14 NOTE — Telephone Encounter (Signed)
Called pt's contact number to inquire abt missed appt for today. No answer, but left a detailed message for pt to call [phone] to r/s appt.

## 2015-09-16 ENCOUNTER — Telehealth: Payer: Self-pay | Admitting: Internal Medicine

## 2015-09-16 NOTE — Telephone Encounter (Signed)
Patient called to r/s missed 7/5 appointments. Gave patient new appointment for 7/13 @ 11:45 am.

## 2015-09-22 ENCOUNTER — Telehealth: Payer: Self-pay | Admitting: Internal Medicine

## 2015-09-22 ENCOUNTER — Ambulatory Visit (HOSPITAL_BASED_OUTPATIENT_CLINIC_OR_DEPARTMENT_OTHER): Payer: BLUE CROSS/BLUE SHIELD

## 2015-09-22 ENCOUNTER — Encounter: Payer: Self-pay | Admitting: Internal Medicine

## 2015-09-22 ENCOUNTER — Ambulatory Visit (HOSPITAL_BASED_OUTPATIENT_CLINIC_OR_DEPARTMENT_OTHER): Payer: BLUE CROSS/BLUE SHIELD | Admitting: Internal Medicine

## 2015-09-22 ENCOUNTER — Other Ambulatory Visit: Payer: Self-pay | Admitting: Medical Oncology

## 2015-09-22 ENCOUNTER — Other Ambulatory Visit (HOSPITAL_BASED_OUTPATIENT_CLINIC_OR_DEPARTMENT_OTHER): Payer: BLUE CROSS/BLUE SHIELD

## 2015-09-22 VITALS — BP 121/74 | HR 82 | Temp 98.3°F | Resp 17 | Ht 70.0 in | Wt 196.2 lb

## 2015-09-22 DIAGNOSIS — D751 Secondary polycythemia: Secondary | ICD-10-CM

## 2015-09-22 LAB — COMPREHENSIVE METABOLIC PANEL
ALT: 19 U/L (ref 0–55)
AST: 16 U/L (ref 5–34)
Albumin: 3.5 g/dL (ref 3.5–5.0)
Alkaline Phosphatase: 75 U/L (ref 40–150)
Anion Gap: 9 mEq/L (ref 3–11)
BUN: 29.5 mg/dL — ABNORMAL HIGH (ref 7.0–26.0)
CO2: 21 mEq/L — ABNORMAL LOW (ref 22–29)
Calcium: 9.2 mg/dL (ref 8.4–10.4)
Chloride: 109 mEq/L (ref 98–109)
Creatinine: 1.1 mg/dL (ref 0.7–1.3)
EGFR: 73 mL/min/{1.73_m2} — ABNORMAL LOW (ref >90)
Glucose: 99 mg/dl (ref 70–140)
Potassium: 4.1 mEq/L (ref 3.5–5.1)
Sodium: 138 mEq/L (ref 136–145)
Total Bilirubin: 0.43 mg/dL (ref 0.20–1.20)
Total Protein: 7 g/dL (ref 6.4–8.3)

## 2015-09-22 LAB — CBC WITH DIFFERENTIAL/PLATELET
BASO%: 0.4 % (ref 0.0–2.0)
Basophils Absolute: 0 10*3/uL (ref 0.0–0.1)
EOS%: 1.5 % (ref 0.0–7.0)
Eosinophils Absolute: 0.1 10*3/uL (ref 0.0–0.5)
HCT: 52.1 % — ABNORMAL HIGH (ref 38.4–49.9)
HGB: 17.1 g/dL (ref 13.0–17.1)
LYMPH%: 15.3 % (ref 14.0–49.0)
MCH: 29.5 pg (ref 27.2–33.4)
MCHC: 32.8 g/dL (ref 32.0–36.0)
MCV: 89.9 fL (ref 79.3–98.0)
MONO#: 0.7 10*3/uL (ref 0.1–0.9)
MONO%: 8.4 % (ref 0.0–14.0)
NEUT#: 6 10*3/uL (ref 1.5–6.5)
NEUT%: 74.4 % (ref 39.0–75.0)
Platelets: 204 10*3/uL (ref 140–400)
RBC: 5.79 10*6/uL (ref 4.20–5.82)
RDW: 15.9 % — ABNORMAL HIGH (ref 11.0–14.6)
WBC: 8.1 10*3/uL (ref 4.0–10.3)
lymph#: 1.2 10*3/uL (ref 0.9–3.3)

## 2015-09-22 NOTE — Telephone Encounter (Signed)
GAVE PATIENT AVS REPORT AND APPOINTMENTS FOR October  °

## 2015-09-22 NOTE — Progress Notes (Signed)
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   PRIOR THERAPY: None   CURRENT THERAPY: Phlebotomy on an as-needed basis noted to keep the hematocrit at 45% or less. He is scheduled for one today.  INTERVAL HISTORY: [name] 55 y.o. male returns to the clinic today for routine 3 months followup visit. The patient is feeling fine with no complaints today. No recent chest flutter as he had in the past. He also still on testosterone gel by his urologist. He denied having any significant fatigue or weakness. He denied having any weight loss or night sweats. He has no chest pain, shortness of breath, cough or hemoptysis. He denied having any significant bleeding issues. He has repeat CBC and comprehensive metabolic panel performed earlier today and he is here for evaluation and discussion of his lab results.  MEDICAL HISTORY: Past Medical History  Diagnosis Date  . Palpitations     at times   . GERD (gastroesophageal reflux disease)   . Arthritis     oa  . Cancer ([place]) 4 months ago    basal cell skin cancer removed from chest area  . Hepatitis C 04/29/2012    had savalta treatment 2 years ago, now cured  . Anemia     slight anemia with hepatitis tx    ALLERGIES:  has No Known Allergies.  MEDICATIONS:  Current Outpatient Prescriptions  Medication Sig Dispense Refill  . TESTIM 50 MG/5GM GEL Place 5 g onto the skin daily. Apply to chest    . valACYclovir (VALTREX) 1000 MG tablet Take 1,000 mg by mouth daily as needed (for fever blisters). Reported on 09/22/2015  2   No current facility-administered medications for this visit.    REVIEW OF SYSTEMS:  A comprehensive review of systems was negative.   PHYSICAL EXAMINATION: General appearance: alert, cooperative and no distress Head:  Normocephalic, without obvious abnormality, atraumatic Neck: no adenopathy, no JVD, supple, symmetrical, trachea midline and thyroid not enlarged, symmetric, no tenderness/mass/nodules Lymph nodes: Cervical, supraclavicular, and axillary nodes normal. Resp: clear to auscultation bilaterally Back: symmetric, no curvature. ROM normal. No CVA tenderness. Cardio: regular rate and rhythm, S1, S2 normal, no murmur, click, rub or gallop GI: soft, non-tender; bowel sounds normal; no masses,  no organomegaly Extremities: extremities normal, atraumatic, no cyanosis or edema  ECOG PERFORMANCE STATUS: 0 - Asymptomatic  There were no vitals taken for this visit.  LABORATORY DATA: Lab Results  Component Value Date   WBC 8.1 09/22/2015   HGB 17.1 09/22/2015   HCT 52.1* 09/22/2015   MCV 89.9 09/22/2015   PLT 204 09/22/2015      Chemistry      Component Value Date/Time   NA 142 06/15/2015 1452   NA 136 01/12/2015 0430   K 4.3 06/15/2015 1452   K 4.0 01/12/2015 0430   CL 103 01/12/2015 0430   CL 106 08/06/2012 1536   CO2 24 06/15/2015 1452   CO2 28 01/12/2015 0430   BUN 30.7* 06/15/2015 1452   BUN 17 01/12/2015 0430   CREATININE 1.3 06/15/2015 1452   CREATININE 1.02 01/12/2015 0430      Component Value Date/Time   CALCIUM 9.7 06/15/2015 1452   CALCIUM 8.8* 01/12/2015 0430   ALKPHOS 69 06/15/2015 1452   ALKPHOS 51 08/22/2011 0923   AST 23 06/15/2015  1452   AST 27 08/22/2011 0923   ALT 28 06/15/2015 1452   ALT 33 08/22/2011 0923   BILITOT 0.31 06/15/2015 1452   BILITOT 0.5 08/22/2011 0923       RADIOGRAPHIC STUDIES: No results found.  ASSESSMENT AND PLAN: This is a very pleasant 55 years old white male with reactive polycythemia secondary to hormonal treatment and currently on treatment with phlebotomy as needed basis to keep his hematocrit close to 45%. The patient is doing fine today. His hematocrit is elevated today to 52.1%. I recommended for the patient to proceed with  phlebotomy today. [name] with repeat CBC, comprehensive metabolic panel, LDH and phlebotomy as needed. He was advised to call immediately if he has any concerning symptoms in the interval. The patient voices understanding of current disease status and treatment options and is in agreement with the current care plan.  All questions were answered. The patient knows to call the clinic with any problems, questions or concerns. We can certainly see the patient much sooner if necessary.  Disclaimer: This note was dictated with voice recognition software. Similar sounding words can inadvertently be transcribed and may not be corrected upon review.

## 2015-09-22 NOTE — Progress Notes (Addendum)
Therapeutic phlebotomy performed per MD orders.  HCT was 52.1.  Per MD orders goal <45 HCT.  Started at 1304, ended 1312 using 16G Phelbotomy set in RAC, tolerated well.  Food offered accepted.  522 grams removed.  Follow up 30 minutes post procedure.

## 2015-09-22 NOTE — Patient Instructions (Signed)

## 2015-10-17 DIAGNOSIS — M109 Gout, unspecified: Secondary | ICD-10-CM | POA: Diagnosis not present

## 2015-11-24 DIAGNOSIS — M109 Gout, unspecified: Secondary | ICD-10-CM | POA: Diagnosis not present

## 2015-12-01 DIAGNOSIS — Z96641 Presence of right artificial hip joint: Secondary | ICD-10-CM | POA: Diagnosis not present

## 2015-12-01 DIAGNOSIS — Z471 Aftercare following joint replacement surgery: Secondary | ICD-10-CM | POA: Diagnosis not present

## 2015-12-06 ENCOUNTER — Telehealth: Payer: Self-pay | Admitting: Internal Medicine

## 2015-12-06 NOTE — Telephone Encounter (Signed)
10/11 Appointments rescheduled to 10/05 per patient request and availability. The patient lives out of town and would be able to attend appointments only on 10/05.

## 2015-12-14 DIAGNOSIS — M2012 Hallux valgus (acquired), left foot: Secondary | ICD-10-CM | POA: Diagnosis not present

## 2015-12-14 DIAGNOSIS — M10071 Idiopathic gout, right ankle and foot: Secondary | ICD-10-CM | POA: Diagnosis not present

## 2015-12-14 DIAGNOSIS — M2011 Hallux valgus (acquired), right foot: Secondary | ICD-10-CM | POA: Diagnosis not present

## 2015-12-15 ENCOUNTER — Telehealth: Payer: Self-pay | Admitting: *Deleted

## 2015-12-15 ENCOUNTER — Encounter: Payer: Self-pay | Admitting: Internal Medicine

## 2015-12-15 ENCOUNTER — Ambulatory Visit (HOSPITAL_BASED_OUTPATIENT_CLINIC_OR_DEPARTMENT_OTHER): Payer: BLUE CROSS/BLUE SHIELD

## 2015-12-15 ENCOUNTER — Other Ambulatory Visit (HOSPITAL_BASED_OUTPATIENT_CLINIC_OR_DEPARTMENT_OTHER): Payer: BLUE CROSS/BLUE SHIELD

## 2015-12-15 ENCOUNTER — Ambulatory Visit (HOSPITAL_BASED_OUTPATIENT_CLINIC_OR_DEPARTMENT_OTHER): Payer: BLUE CROSS/BLUE SHIELD | Admitting: Internal Medicine

## 2015-12-15 ENCOUNTER — Telehealth: Payer: Self-pay | Admitting: Internal Medicine

## 2015-12-15 VITALS — BP 146/87 | HR 73 | Temp 98.1°F | Resp 17 | Ht 70.0 in | Wt 195.1 lb

## 2015-12-15 VITALS — BP 129/80 | HR 71 | Resp 16

## 2015-12-15 DIAGNOSIS — D751 Secondary polycythemia: Secondary | ICD-10-CM

## 2015-12-15 DIAGNOSIS — D45 Polycythemia vera: Secondary | ICD-10-CM

## 2015-12-15 LAB — COMPREHENSIVE METABOLIC PANEL
ALT: 20 U/L (ref 0–55)
AST: 17 U/L (ref 5–34)
Albumin: 3.6 g/dL (ref 3.5–5.0)
Alkaline Phosphatase: 62 U/L (ref 40–150)
Anion Gap: 8 mEq/L (ref 3–11)
BUN: 24.4 mg/dL (ref 7.0–26.0)
CO2: 21 mEq/L — ABNORMAL LOW (ref 22–29)
Calcium: 9.2 mg/dL (ref 8.4–10.4)
Chloride: 110 mEq/L — ABNORMAL HIGH (ref 98–109)
Creatinine: 1 mg/dL (ref 0.7–1.3)
EGFR: 83 mL/min/{1.73_m2} — ABNORMAL LOW (ref >90)
Glucose: 91 mg/dl (ref 70–140)
Potassium: 4.1 mEq/L (ref 3.5–5.1)
Sodium: 139 mEq/L (ref 136–145)
Total Bilirubin: 0.42 mg/dL (ref 0.20–1.20)
Total Protein: 6.9 g/dL (ref 6.4–8.3)

## 2015-12-15 LAB — CBC WITH DIFFERENTIAL/PLATELET
BASO%: 0.1 % (ref 0.0–2.0)
Basophils Absolute: 0 10*3/uL (ref 0.0–0.1)
EOS%: 0.9 % (ref 0.0–7.0)
Eosinophils Absolute: 0.1 10*3/uL (ref 0.0–0.5)
HCT: 48.9 % (ref 38.4–49.9)
HGB: 16.4 g/dL (ref 13.0–17.1)
LYMPH%: 15.5 % (ref 14.0–49.0)
MCH: 30.9 pg (ref 27.2–33.4)
MCHC: 33.5 g/dL (ref 32.0–36.0)
MCV: 92.3 fL (ref 79.3–98.0)
MONO#: 0.7 10*3/uL (ref 0.1–0.9)
MONO%: 7.3 % (ref 0.0–14.0)
NEUT#: 7.4 10*3/uL — ABNORMAL HIGH (ref 1.5–6.5)
NEUT%: 76.2 % — ABNORMAL HIGH (ref 39.0–75.0)
Platelets: 187 10*3/uL (ref 140–400)
RBC: 5.3 10*6/uL (ref 4.20–5.82)
RDW: 14.3 % (ref 11.0–14.6)
WBC: 9.6 10*3/uL (ref 4.0–10.3)
lymph#: 1.5 10*3/uL (ref 0.9–3.3)

## 2015-12-15 LAB — LACTATE DEHYDROGENASE: LDH: 160 U/L (ref 125–245)

## 2015-12-15 NOTE — Telephone Encounter (Signed)
Per LOS I have scheduled appts and notified the scheduler 

## 2015-12-15 NOTE — Patient Instructions (Signed)

## 2015-12-15 NOTE — Telephone Encounter (Signed)
Message sent to chemo scheduler to add Phlebotomy. Avs report and appointment schedule given to patient, per 12/15/15 los.

## 2015-12-15 NOTE — Progress Notes (Signed)
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   PRIOR THERAPY: None   CURRENT THERAPY: Phlebotomy on an as-needed basis noted to keep the hematocrit at 45% or less. He is scheduled for one today.  INTERVAL HISTORY: James [name] 55 y.o. male returns to the clinic today for routine 3 months followup visit. The patient is feeling fine with no complaints today. He was recently diagnosed with gout and treated with colchicine. He also still on testosterone gel by his urologist. He denied having any significant fatigue or weakness. He denied having any weight loss or night sweats. He has no chest pain, shortness of breath, cough or hemoptysis. He denied having any significant bleeding issues. He has repeat CBC and comprehensive metabolic panel performed earlier today and he is here for evaluation and discussion of his lab results.  MEDICAL HISTORY: Past Medical History:  Diagnosis Date  . Anemia    slight anemia with hepatitis tx  . Arthritis    oa  . Cancer ([place]) 4 months ago   basal cell skin cancer removed from chest area  . GERD (gastroesophageal reflux disease)   . Hepatitis C 04/29/2012   had savalta treatment 2 years ago, now cured  . Palpitations    at times     ALLERGIES:  has No Known Allergies.  MEDICATIONS:  Current Outpatient Prescriptions  Medication Sig Dispense Refill  . Colchicine 0.6 MG CAPS TAKE 2 CAPSULES ONE TIME THEN REPEAT WITH 1 CAPSULE 1 HOUR LATER  0  . colchicine 0.6 MG tablet   0  . indomethacin (INDOCIN) 50 MG capsule Take 50 mg by mouth 3 (three) times daily.  0  . predniSONE (STERAPRED UNI-PAK 21 TAB) 10 MG (21) TBPK tablet See admin instructions.  0  . TESTIM 50 MG/5GM GEL Place 5 g onto the skin daily. Apply to chest    . valACYclovir (VALTREX) 1000 MG tablet  Take 1,000 mg by mouth daily as needed (for fever blisters). Reported on 09/22/2015  2   No current facility-administered medications for this visit.     REVIEW OF SYSTEMS:  A comprehensive review of systems was negative.   PHYSICAL EXAMINATION: General appearance: alert, cooperative and no distress Head: Normocephalic, without obvious abnormality, atraumatic Neck: no adenopathy, no JVD, supple, symmetrical, trachea midline and thyroid not enlarged, symmetric, no tenderness/mass/nodules Lymph nodes: Cervical, supraclavicular, and axillary nodes normal. Resp: clear to auscultation bilaterally Back: symmetric, no curvature. ROM normal. No CVA tenderness. Cardio: regular rate and rhythm, S1, S2 normal, no murmur, click, rub or gallop GI: soft, non-tender; bowel sounds normal; no masses,  no organomegaly Extremities: extremities normal, atraumatic, no cyanosis or edema  ECOG PERFORMANCE STATUS: 0 - Asymptomatic  Blood pressure (!) 146/87, pulse 73, temperature 98.1 F (36.7 C), temperature source Oral, resp. rate 17, height 5\' 10"  (1.778 m), weight 195 lb 1.6 oz (88.5 kg), SpO2 97 %.  LABORATORY DATA: Lab Results  Component Value Date   WBC 9.6 12/15/2015   HGB 16.4 12/15/2015   HCT 48.9 12/15/2015   MCV 92.3 12/15/2015   PLT 187 12/15/2015      Chemistry      Component Value Date/Time   NA 138 09/22/2015 1204   K 4.1 09/22/2015 1204   CL 103 01/12/2015 0430   CL 106 08/06/2012 1536  CO2 21 (L) 09/22/2015 1204   BUN 29.5 (H) 09/22/2015 1204   CREATININE 1.1 09/22/2015 1204      Component Value Date/Time   CALCIUM 9.2 09/22/2015 1204   ALKPHOS 75 09/22/2015 1204   AST 16 09/22/2015 1204   ALT 19 09/22/2015 1204   BILITOT 0.43 09/22/2015 1204       RADIOGRAPHIC STUDIES: No results found.  ASSESSMENT AND PLAN: This is a very pleasant 55 years old white male with reactive polycythemia secondary to hormonal treatment and currently on treatment with phlebotomy as needed  basis to keep his hematocrit close to 45%. The patient is doing fine today. His hematocrit is elevated today to 48.9%. I recommended for the patient to proceed with phlebotomy today. [name] with repeat CBC, comprehensive metabolic panel, LDH and phlebotomy as needed. He was advised to call immediately if he has any concerning symptoms in the interval. The patient voices understanding of current disease status and treatment options and is in agreement with the current care plan.  All questions were answered. The patient knows to call the clinic with any problems, questions or concerns. We can certainly see the patient much sooner if necessary.  Disclaimer: This note was dictated with voice recognition software. Similar sounding words can inadvertently be transcribed and may not be corrected upon review.

## 2015-12-21 ENCOUNTER — Ambulatory Visit: Payer: BLUE CROSS/BLUE SHIELD | Admitting: Internal Medicine

## 2015-12-21 ENCOUNTER — Other Ambulatory Visit: Payer: BLUE CROSS/BLUE SHIELD

## 2015-12-26 DIAGNOSIS — S52026A Nondisplaced fracture of olecranon process without intraarticular extension of unspecified ulna, initial encounter for closed fracture: Secondary | ICD-10-CM | POA: Diagnosis not present

## 2016-01-02 DIAGNOSIS — S46311D Strain of muscle, fascia and tendon of triceps, right arm, subsequent encounter: Secondary | ICD-10-CM | POA: Diagnosis not present

## 2016-01-02 DIAGNOSIS — S56511A Strain of other extensor muscle, fascia and tendon at forearm level, right arm, initial encounter: Secondary | ICD-10-CM | POA: Diagnosis not present

## 2016-01-02 DIAGNOSIS — M25521 Pain in right elbow: Secondary | ICD-10-CM | POA: Diagnosis not present

## 2016-01-02 DIAGNOSIS — M25421 Effusion, right elbow: Secondary | ICD-10-CM | POA: Diagnosis not present

## 2016-01-02 DIAGNOSIS — M7989 Other specified soft tissue disorders: Secondary | ICD-10-CM | POA: Diagnosis not present

## 2016-01-02 DIAGNOSIS — S52021A Displaced fracture of olecranon process without intraarticular extension of right ulna, initial encounter for closed fracture: Secondary | ICD-10-CM | POA: Diagnosis not present

## 2016-01-02 DIAGNOSIS — M66821 Spontaneous rupture of other tendons, right upper arm: Secondary | ICD-10-CM | POA: Diagnosis not present

## 2016-01-03 DIAGNOSIS — M66821 Spontaneous rupture of other tendons, right upper arm: Secondary | ICD-10-CM | POA: Diagnosis not present

## 2016-01-03 DIAGNOSIS — M25521 Pain in right elbow: Secondary | ICD-10-CM | POA: Diagnosis not present

## 2016-01-10 DIAGNOSIS — Z125 Encounter for screening for malignant neoplasm of prostate: Secondary | ICD-10-CM | POA: Diagnosis not present

## 2016-01-10 DIAGNOSIS — E291 Testicular hypofunction: Secondary | ICD-10-CM | POA: Diagnosis not present

## 2016-01-10 DIAGNOSIS — S46311A Strain of muscle, fascia and tendon of triceps, right arm, initial encounter: Secondary | ICD-10-CM | POA: Diagnosis not present

## 2016-01-11 DIAGNOSIS — E291 Testicular hypofunction: Secondary | ICD-10-CM | POA: Diagnosis not present

## 2016-01-16 DIAGNOSIS — J209 Acute bronchitis, unspecified: Secondary | ICD-10-CM | POA: Diagnosis not present

## 2016-01-16 DIAGNOSIS — B009 Herpesviral infection, unspecified: Secondary | ICD-10-CM | POA: Diagnosis not present

## 2016-01-16 DIAGNOSIS — S46309A Unspecified injury of muscle, fascia and tendon of triceps, unspecified arm, initial encounter: Secondary | ICD-10-CM | POA: Diagnosis not present

## 2016-02-19 DIAGNOSIS — H0289 Other specified disorders of eyelid: Secondary | ICD-10-CM | POA: Diagnosis not present

## 2016-02-20 DIAGNOSIS — M7551 Bursitis of right shoulder: Secondary | ICD-10-CM | POA: Diagnosis not present

## 2016-02-20 DIAGNOSIS — S46311A Strain of muscle, fascia and tendon of triceps, right arm, initial encounter: Secondary | ICD-10-CM | POA: Diagnosis not present

## 2016-02-20 DIAGNOSIS — Y998 Other external cause status: Secondary | ICD-10-CM | POA: Diagnosis not present

## 2016-02-20 DIAGNOSIS — M7021 Olecranon bursitis, right elbow: Secondary | ICD-10-CM | POA: Diagnosis not present

## 2016-03-02 DIAGNOSIS — S46311D Strain of muscle, fascia and tendon of triceps, right arm, subsequent encounter: Secondary | ICD-10-CM | POA: Diagnosis not present

## 2016-03-19 DIAGNOSIS — S46311D Strain of muscle, fascia and tendon of triceps, right arm, subsequent encounter: Secondary | ICD-10-CM | POA: Diagnosis not present

## 2016-03-21 ENCOUNTER — Telehealth: Payer: Self-pay | Admitting: *Deleted

## 2016-03-21 ENCOUNTER — Other Ambulatory Visit: Payer: BLUE CROSS/BLUE SHIELD

## 2016-03-21 ENCOUNTER — Ambulatory Visit: Payer: BLUE CROSS/BLUE SHIELD | Admitting: Internal Medicine

## 2016-03-21 NOTE — Telephone Encounter (Signed)
Per patient request I have moved appts from today to tomorrow. Patient stated " I was not aware of appts."  Pat ient given new appts over the phone

## 2016-03-22 ENCOUNTER — Other Ambulatory Visit: Payer: Self-pay | Admitting: Internal Medicine

## 2016-03-22 ENCOUNTER — Encounter: Payer: Self-pay | Admitting: Internal Medicine

## 2016-03-22 ENCOUNTER — Ambulatory Visit (HOSPITAL_BASED_OUTPATIENT_CLINIC_OR_DEPARTMENT_OTHER): Payer: BLUE CROSS/BLUE SHIELD

## 2016-03-22 ENCOUNTER — Telehealth: Payer: Self-pay | Admitting: Internal Medicine

## 2016-03-22 ENCOUNTER — Other Ambulatory Visit (HOSPITAL_BASED_OUTPATIENT_CLINIC_OR_DEPARTMENT_OTHER): Payer: BLUE CROSS/BLUE SHIELD

## 2016-03-22 ENCOUNTER — Ambulatory Visit (HOSPITAL_BASED_OUTPATIENT_CLINIC_OR_DEPARTMENT_OTHER): Payer: BLUE CROSS/BLUE SHIELD | Admitting: Internal Medicine

## 2016-03-22 VITALS — BP 131/85 | HR 78 | Temp 98.3°F | Resp 18 | Ht 70.0 in | Wt 201.5 lb

## 2016-03-22 DIAGNOSIS — D751 Secondary polycythemia: Secondary | ICD-10-CM

## 2016-03-22 LAB — COMPREHENSIVE METABOLIC PANEL
ALT: 28 U/L (ref 0–55)
AST: 24 U/L (ref 5–34)
Albumin: 3.9 g/dL (ref 3.5–5.0)
Alkaline Phosphatase: 71 U/L (ref 40–150)
Anion Gap: 8 mEq/L (ref 3–11)
BUN: 25.1 mg/dL (ref 7.0–26.0)
CO2: 27 mEq/L (ref 22–29)
Calcium: 10 mg/dL (ref 8.4–10.4)
Chloride: 106 mEq/L (ref 98–109)
Creatinine: 1.2 mg/dL (ref 0.7–1.3)
EGFR: 64 mL/min/{1.73_m2} — ABNORMAL LOW (ref >90)
Glucose: 97 mg/dl (ref 70–140)
Potassium: 4.4 mEq/L (ref 3.5–5.1)
Sodium: 141 mEq/L (ref 136–145)
Total Bilirubin: 0.43 mg/dL (ref 0.20–1.20)
Total Protein: 7.4 g/dL (ref 6.4–8.3)

## 2016-03-22 LAB — CBC WITH DIFFERENTIAL/PLATELET
BASO%: 0.2 % (ref 0.0–2.0)
Basophils Absolute: 0 10*3/uL (ref 0.0–0.1)
EOS%: 3.3 % (ref 0.0–7.0)
Eosinophils Absolute: 0.2 10*3/uL (ref 0.0–0.5)
HCT: 51.3 % — ABNORMAL HIGH (ref 38.4–49.9)
HGB: 17.4 g/dL — ABNORMAL HIGH (ref 13.0–17.1)
LYMPH%: 26.6 % (ref 14.0–49.0)
MCH: 30.7 pg (ref 27.2–33.4)
MCHC: 33.9 g/dL (ref 32.0–36.0)
MCV: 90.6 fL (ref 79.3–98.0)
MONO#: 0.4 10*3/uL (ref 0.1–0.9)
MONO%: 8.1 % (ref 0.0–14.0)
NEUT#: 3.4 10*3/uL (ref 1.5–6.5)
NEUT%: 61.8 % (ref 39.0–75.0)
Platelets: 190 10*3/uL (ref 140–400)
RBC: 5.66 10*6/uL (ref 4.20–5.82)
RDW: 13.1 % (ref 11.0–14.6)
WBC: 5.5 10*3/uL (ref 4.0–10.3)
lymph#: 1.5 10*3/uL (ref 0.9–3.3)

## 2016-03-22 LAB — LACTATE DEHYDROGENASE: LDH: 207 U/L (ref 125–245)

## 2016-03-22 NOTE — Patient Instructions (Signed)
     Therapeutic Phlebotomy, Care After Refer to this sheet in the next few weeks. These instructions provide you with information about caring for yourself after your procedure. Your health care provider may also give you more specific instructions. Your treatment has been planned according to current medical practices, but problems sometimes occur. Call your health care provider if you have any problems or questions after your procedure. What can I expect after the procedure? After the procedure, it is common to have:  Light-headedness or dizziness. You may feel faint.  Nausea.  Tiredness. Follow these instructions at home: Activity  Return to your normal activities as directed by your health care provider. Most people can go back to their normal activities right away.  Avoid strenuous physical activity and heavy lifting or pulling for about 5 hours after the procedure. Do not lift anything that is heavier than 10 lb (4.5 kg).  Athletes should avoid strenuous exercise for at least 12 hours.  Change positions slowly for the remainder of the day. This will help to prevent light-headedness or fainting.  If you feel light-headed, lie down until the feeling goes away. Eating and drinking  Be sure to eat well-balanced meals for the next 24 hours.  Drink enough fluid to keep your urine clear or pale yellow.  Avoid drinking alcohol on the day that you had the procedure. Care of the Needle Insertion Site  Keep your bandage dry. You can remove the bandage after about 5 hours or as directed by your health care provider.  If you have bleeding from the needle insertion site, elevate your arm and press firmly on the site until the bleeding stops.  If you have bruising at the site, apply ice to the area:  Put ice in a plastic bag.  Place a towel between your skin and the bag.  Leave the ice on for 20 minutes, 2-3 times a day for the first 24 hours.  If the swelling does not go away  after 24 hours, apply a warm, moist washcloth to the area for 20 minutes, 2-3 times a day. General instructions  Avoid smoking for at least 30 minutes after the procedure.  Keep all follow-up visits as directed by your health care provider. It is important to continue with further therapeutic phlebotomy treatments as directed. Contact a health care provider if:  You have redness, swelling, or pain at the needle insertion site.  You have fluid, blood, or pus coming from the needle insertion site.  You feel light-headed, dizzy, or nauseated, and the feeling does not go away.  You notice new bruising at the needle insertion site.  You feel weaker than normal.  You have a fever or chills. Get help right away if:  You have severe nausea or vomiting.  You have chest pain.  You have trouble breathing. This information is not intended to replace advice given to you by your health care provider. Make sure you discuss any questions you have with your health care provider. Document Released: 07/31/2010 Document Revised: 10/29/2015 Document Reviewed: 02/22/2014 Elsevier Interactive Patient Education  2017 Elsevier Inc.  

## 2016-03-22 NOTE — Progress Notes (Signed)
phlebot

## 2016-03-22 NOTE — Telephone Encounter (Signed)
Reviewed futured appt and gave avs. Gave Melissa phlebotomy appt to add to schedule.

## 2016-03-22 NOTE — Progress Notes (Signed)
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   PRIOR THERAPY: None   CURRENT THERAPY: Phlebotomy on an as-needed basis noted to keep the hematocrit at 45% or less. He is scheduled for one today.  INTERVAL HISTORY: James [name] 56 y.o. male came to the clinic today for three-month follow-up visit. The patient is feeling fine today with no specific complaints except for recent fracture of his right arm status post surgical fixation. He is otherwise feeling fine. He denied having any chest pain, shortness of breath, cough or hemoptysis. He has no fever or chills. He denied having any weight loss or night sweats. He has no nausea, vomiting, diarrhea or constipation. The patient has repeat CBC and comprehensive metabolic panel performed earlier today and he is here today for evaluation and discussion of his lab results.  MEDICAL HISTORY: Past Medical History:  Diagnosis Date  . Anemia    slight anemia with hepatitis tx  . Arthritis    oa  . Cancer ([place]) 4 months ago   basal cell skin cancer removed from chest area  . GERD (gastroesophageal reflux disease)   . Hepatitis C 04/29/2012   had savalta treatment 2 years ago, now cured  . Palpitations    at times     ALLERGIES:  has No Known Allergies.  MEDICATIONS:  Current Outpatient Prescriptions  Medication Sig Dispense Refill  . Colchicine 0.6 MG CAPS TAKE 2 CAPSULES ONE TIME THEN REPEAT WITH 1 CAPSULE 1 HOUR LATER  0  . colchicine 0.6 MG tablet   0  . CVS VITAMIN C 500 MG tablet TAKE 1 (ONE) TABLET TWO TIMES DAILY  0  . docusate sodium (COLACE) 100 MG capsule TAKE 1 CAPSULE TWO TIMES DAILY  0  . HYDROcodone-acetaminophen (NORCO) 10-325 MG tablet Take 1 tablet by mouth 4 (four) times daily.  0  . indomethacin (INDOCIN) 50 MG capsule Take 50  mg by mouth 3 (three) times daily.  0  . methocarbamol (ROBAXIN) 500 MG tablet TAKE 1 TABLET BY MOUTH EVERY 6 HOURS AS NEEDED FOR SPASMS  0  . oxyCODONE-acetaminophen (PERCOCET) 10-325 MG tablet Take 1 tablet by mouth 4 (four) times daily.  0  . predniSONE (STERAPRED UNI-PAK 21 TAB) 10 MG (21) TBPK tablet See admin instructions.  0  . TESTIM 50 MG/5GM GEL Place 5 g onto the skin daily. Apply to chest    . valACYclovir (VALTREX) 1000 MG tablet Take 1,000 mg by mouth daily as needed (for fever blisters). Reported on 09/22/2015  2   No current facility-administered medications for this visit.     REVIEW OF SYSTEMS:  A comprehensive review of systems was negative.   PHYSICAL EXAMINATION: General appearance: alert, cooperative and no distress Head: Normocephalic, without obvious abnormality, atraumatic Neck: no adenopathy, no JVD, supple, symmetrical, trachea midline and thyroid not enlarged, symmetric, no tenderness/mass/nodules Lymph nodes: Cervical, supraclavicular, and axillary nodes normal. Resp: clear to auscultation bilaterally Back: symmetric, no curvature. ROM normal. No CVA tenderness. Cardio: regular rate and rhythm, S1, S2 normal, no murmur, click, rub or gallop GI: soft, non-tender; bowel sounds normal; no masses,  no organomegaly Extremities: extremities normal, atraumatic, no cyanosis or edema  ECOG PERFORMANCE STATUS: 0 - Asymptomatic  Blood pressure 131/85, pulse 78, temperature 98.3 F (36.8 C), temperature source Oral, resp. rate  18, height 5\' 10"  (1.778 m), weight 201 lb 8 oz (91.4 kg), SpO2 97 %.  LABORATORY DATA: Lab Results  Component Value Date   WBC 5.5 03/22/2016   HGB 17.4 (H) 03/22/2016   HCT 51.3 (H) 03/22/2016   MCV 90.6 03/22/2016   PLT 190 03/22/2016      Chemistry      Component Value Date/Time   NA 141 03/22/2016 0930   K 4.4 03/22/2016 0930   CL 103 01/12/2015 0430   CL 106 08/06/2012 1536   CO2 27 03/22/2016 0930   BUN 25.1 03/22/2016 0930    CREATININE 1.2 03/22/2016 0930      Component Value Date/Time   CALCIUM 10.0 03/22/2016 0930   ALKPHOS 71 03/22/2016 0930   AST 24 03/22/2016 0930   ALT 28 03/22/2016 0930   BILITOT 0.43 03/22/2016 0930       RADIOGRAPHIC STUDIES: No results found.  ASSESSMENT AND PLAN:  This is a very pleasant 56 years old white male with reactive polycythemia secondary to hormonal treatment. He is currently on phlebotomy on as-needed basis. His hematocrit is 51.3% today. I recommended for the patient to proceed with phlebotomy today. [name] for reevaluation with repeat CBC, comprehensive metabolic panel and phlebotomy as needed. He was advised to call if he has any concerning symptoms in the interval. The patient voices understanding of current disease status and treatment options and is in agreement with the current care plan.  All questions were answered. The patient knows to call the clinic with any problems, questions or concerns. We can certainly see the patient much sooner if necessary. I spent 10 minutes counseling the patient face to face. The total time spent in the appointment was 15 minutes. Disclaimer: This note was dictated with voice recognition software. Similar sounding words can inadvertently be transcribed and may not be corrected upon review.

## 2016-03-22 NOTE — Progress Notes (Signed)
therapeutic phlebotomy performed per MD order. First attempt in left [facility] using 16 gauge phlebotomy set starting at 1053 and ending at 1058 removing a total of 185 grams. Second attempt performed by Abelina Bachelor RN using 22 Gauge PIV in left AC starting at 1106 and ending at 1111 removing a total of 340 grams ( total of 525 grams). Pt tolerated well. Snack and drink provided.  1142: pt monitored 30 minutes post procedure. Pt and VS stable at discharge.

## 2016-03-30 DIAGNOSIS — S46311D Strain of muscle, fascia and tendon of triceps, right arm, subsequent encounter: Secondary | ICD-10-CM | POA: Diagnosis not present

## 2016-04-06 MED ORDER — FAMOTIDINE IN NACL 20-0.9 MG/50ML-% IV SOLN
INTRAVENOUS | Status: AC
  Filled 2016-04-06: qty 50

## 2016-04-06 MED ORDER — ONDANSETRON HCL 4 MG/2ML IJ SOLN
INTRAMUSCULAR | Status: AC
  Filled 2016-04-06: qty 4

## 2016-04-06 MED ORDER — DIPHENHYDRAMINE HCL 50 MG/ML IJ SOLN
INTRAMUSCULAR | Status: AC
  Filled 2016-04-06: qty 1

## 2016-04-16 DIAGNOSIS — S46311D Strain of muscle, fascia and tendon of triceps, right arm, subsequent encounter: Secondary | ICD-10-CM | POA: Diagnosis not present

## 2016-04-27 DIAGNOSIS — S46311D Strain of muscle, fascia and tendon of triceps, right arm, subsequent encounter: Secondary | ICD-10-CM | POA: Diagnosis not present

## 2016-05-09 DIAGNOSIS — Z85828 Personal history of other malignant neoplasm of skin: Secondary | ICD-10-CM | POA: Diagnosis not present

## 2016-05-09 DIAGNOSIS — Z808 Family history of malignant neoplasm of other organs or systems: Secondary | ICD-10-CM | POA: Diagnosis not present

## 2016-05-09 DIAGNOSIS — D229 Melanocytic nevi, unspecified: Secondary | ICD-10-CM | POA: Diagnosis not present

## 2016-05-09 DIAGNOSIS — L570 Actinic keratosis: Secondary | ICD-10-CM | POA: Diagnosis not present

## 2016-06-20 ENCOUNTER — Ambulatory Visit: Payer: BLUE CROSS/BLUE SHIELD | Admitting: Internal Medicine

## 2016-06-20 ENCOUNTER — Other Ambulatory Visit: Payer: BLUE CROSS/BLUE SHIELD

## 2016-06-20 ENCOUNTER — Telehealth: Payer: Self-pay | Admitting: Medical Oncology

## 2016-06-20 ENCOUNTER — Telehealth: Payer: Self-pay | Admitting: Internal Medicine

## 2016-06-20 NOTE — Telephone Encounter (Signed)
r/s appt per sch message . Patient aware of new appt date and time.

## 2016-06-20 NOTE — Telephone Encounter (Signed)
Per Pt message , I will cancel pt appts today.

## 2016-07-03 DIAGNOSIS — S46311D Strain of muscle, fascia and tendon of triceps, right arm, subsequent encounter: Secondary | ICD-10-CM | POA: Diagnosis not present

## 2016-07-03 DIAGNOSIS — Z4789 Encounter for other orthopedic aftercare: Secondary | ICD-10-CM | POA: Diagnosis not present

## 2016-07-26 ENCOUNTER — Ambulatory Visit (HOSPITAL_BASED_OUTPATIENT_CLINIC_OR_DEPARTMENT_OTHER): Payer: BLUE CROSS/BLUE SHIELD | Admitting: Internal Medicine

## 2016-07-26 ENCOUNTER — Encounter: Payer: Self-pay | Admitting: Internal Medicine

## 2016-07-26 ENCOUNTER — Other Ambulatory Visit (HOSPITAL_BASED_OUTPATIENT_CLINIC_OR_DEPARTMENT_OTHER): Payer: BLUE CROSS/BLUE SHIELD

## 2016-07-26 ENCOUNTER — Telehealth: Payer: Self-pay | Admitting: Internal Medicine

## 2016-07-26 VITALS — BP 134/93 | HR 74 | Temp 98.4°F | Resp 20 | Ht 70.0 in | Wt 202.8 lb

## 2016-07-26 DIAGNOSIS — E291 Testicular hypofunction: Secondary | ICD-10-CM | POA: Diagnosis not present

## 2016-07-26 DIAGNOSIS — D751 Secondary polycythemia: Secondary | ICD-10-CM | POA: Diagnosis not present

## 2016-07-26 LAB — COMPREHENSIVE METABOLIC PANEL
ALT: 28 U/L (ref 0–55)
AST: 20 U/L (ref 5–34)
Albumin: 3.7 g/dL (ref 3.5–5.0)
Alkaline Phosphatase: 53 U/L (ref 40–150)
Anion Gap: 5 mEq/L (ref 3–11)
BUN: 23.2 mg/dL (ref 7.0–26.0)
CO2: 22 mEq/L (ref 22–29)
Calcium: 9.2 mg/dL (ref 8.4–10.4)
Chloride: 112 mEq/L — ABNORMAL HIGH (ref 98–109)
Creatinine: 1.1 mg/dL (ref 0.7–1.3)
EGFR: 75 mL/min/{1.73_m2} — ABNORMAL LOW (ref >90)
Glucose: 99 mg/dl (ref 70–140)
Potassium: 4.1 mEq/L (ref 3.5–5.1)
Sodium: 139 mEq/L (ref 136–145)
Total Bilirubin: 0.52 mg/dL (ref 0.20–1.20)
Total Protein: 6.6 g/dL (ref 6.4–8.3)

## 2016-07-26 LAB — CBC WITH DIFFERENTIAL/PLATELET
BASO%: 0.5 % (ref 0.0–2.0)
Basophils Absolute: 0 10*3/uL (ref 0.0–0.1)
EOS%: 2.2 % (ref 0.0–7.0)
Eosinophils Absolute: 0.1 10*3/uL (ref 0.0–0.5)
HCT: 46.4 % (ref 38.4–49.9)
HGB: 15.8 g/dL (ref 13.0–17.1)
LYMPH%: 24.7 % (ref 14.0–49.0)
MCH: 30.4 pg (ref 27.2–33.4)
MCHC: 34 g/dL (ref 32.0–36.0)
MCV: 89.5 fL (ref 79.3–98.0)
MONO#: 0.4 10*3/uL (ref 0.1–0.9)
MONO%: 7.7 % (ref 0.0–14.0)
NEUT#: 3.1 10*3/uL (ref 1.5–6.5)
NEUT%: 64.9 % (ref 39.0–75.0)
Platelets: 202 10*3/uL (ref 140–400)
RBC: 5.18 10*6/uL (ref 4.20–5.82)
RDW: 15.3 % — ABNORMAL HIGH (ref 11.0–14.6)
WBC: 4.8 10*3/uL (ref 4.0–10.3)
lymph#: 1.2 10*3/uL (ref 0.9–3.3)

## 2016-07-26 NOTE — Progress Notes (Signed)
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   PRIOR THERAPY: None   CURRENT THERAPY: Phlebotomy on an as-needed basis noted to keep the hematocrit at 45% or less.  INTERVAL HISTORY: James [name] 56 y.o. male returns to the clinic today for follow-up visit. He was last seen 4 months ago. The patient is feeling fine today with no specific complaints. He denied having any chest pain, shortness of breath, cough or hemoptysis. He denied having any fever or chills. His dose of androgen treatment was reduced recently. He has no significant weight loss or night sweats. He has no nausea, vomiting, diarrhea or constipation. He is here today for evaluation with repeat CBC.  MEDICAL HISTORY: Past Medical History:  Diagnosis Date  . Anemia    slight anemia with hepatitis tx  . Arthritis    oa  . Cancer ([place]) 4 months ago   basal cell skin cancer removed from chest area  . GERD (gastroesophageal reflux disease)   . Hepatitis C 04/29/2012   had savalta treatment 2 years ago, now cured  . Palpitations    at times     ALLERGIES:  has No Known Allergies.  MEDICATIONS:  Current Outpatient Prescriptions  Medication Sig Dispense Refill  . Colchicine 0.6 MG CAPS TAKE 2 CAPSULES ONE TIME THEN REPEAT WITH 1 CAPSULE 1 HOUR LATER  0  . CVS VITAMIN C 500 MG tablet TAKE 1 (ONE) TABLET TWO TIMES DAILY  0  . docusate sodium (COLACE) 100 MG capsule TAKE 1 CAPSULE TWO TIMES DAILY  0  . indomethacin (INDOCIN) 50 MG capsule Take 50 mg by mouth 3 (three) times daily.  0  . methocarbamol (ROBAXIN) 500 MG tablet TAKE 1 TABLET BY MOUTH EVERY 6 HOURS AS NEEDED FOR SPASMS  0  . TESTIM 50 MG/5GM GEL Place 5 g onto the skin daily. Apply to chest    . colchicine 0.6 MG tablet   0  . HYDROcodone-acetaminophen (NORCO) 10-325  MG tablet Take 1 tablet by mouth 4 (four) times daily.  0  . oxyCODONE-acetaminophen (PERCOCET) 10-325 MG tablet Take 1 tablet by mouth 4 (four) times daily.  0  . valACYclovir (VALTREX) 1000 MG tablet Take 1,000 mg by mouth daily as needed (for fever blisters). Reported on 09/22/2015  2   No current facility-administered medications for this visit.     REVIEW OF SYSTEMS:  A comprehensive review of systems was negative.   PHYSICAL EXAMINATION: General appearance: alert, cooperative and no distress Head: Normocephalic, without obvious abnormality, atraumatic Neck: no adenopathy, no JVD, supple, symmetrical, trachea midline and thyroid not enlarged, symmetric, no tenderness/mass/nodules Lymph nodes: Cervical, supraclavicular, and axillary nodes normal. Resp: clear to auscultation bilaterally Back: symmetric, no curvature. ROM normal. No CVA tenderness. Cardio: regular rate and rhythm, S1, S2 normal, no murmur, click, rub or gallop GI: soft, non-tender; bowel sounds normal; no masses,  no organomegaly Extremities: extremities normal, atraumatic, no cyanosis or edema  ECOG PERFORMANCE STATUS: 0 - Asymptomatic  Blood pressure (!) 134/93, pulse 74, temperature 98.4 F (36.9 C), temperature source Oral, resp. rate 20, height 5\' 10"  (1.778 m), weight 202 lb 12.8 oz (92 kg), SpO2 98 %.  LABORATORY DATA: Lab Results  Component Value Date   WBC 4.8 07/26/2016   HGB 15.8 07/26/2016   HCT 46.4 07/26/2016  MCV 89.5 07/26/2016   PLT 202 07/26/2016      Chemistry      Component Value Date/Time   NA 139 07/26/2016 1104   K 4.1 07/26/2016 1104   CL 103 01/12/2015 0430   CL 106 08/06/2012 1536   CO2 22 07/26/2016 1104   BUN 23.2 07/26/2016 1104   CREATININE 1.1 07/26/2016 1104      Component Value Date/Time   CALCIUM 9.2 07/26/2016 1104   ALKPHOS 53 07/26/2016 1104   AST 20 07/26/2016 1104   ALT 28 07/26/2016 1104   BILITOT 0.52 07/26/2016 1104       RADIOGRAPHIC STUDIES: No  results found.  ASSESSMENT AND PLAN:  This is a very pleasant 56 years old white male with reactive polycythemia secondary to androgen treatment. The patient has been undergoing phlebotomy on as-needed basis to keep his hematocrit around 45%. His dose of androgen has been reduced and his hemoglobin and hematocrit are within the acceptable range today. I recommended for the patient to continue on observation with repeat CBC and comprehensive metabolic panel in 3 months. He was advised to call immediately if he has any concerning symptoms in the interval. The patient voices understanding of current disease status and treatment options and is in agreement with the current care plan.  All questions were answered. The patient knows to call the clinic with any problems, questions or concerns. We can certainly see the patient much sooner if necessary. I spent 10 minutes counseling the patient face to face. The total time spent in the appointment was 15 minutes. Disclaimer: This note was dictated with voice recognition software. Similar sounding words can inadvertently be transcribed and may not be corrected upon review.

## 2016-07-26 NOTE — Patient Instructions (Signed)
Steps to Quit Smoking Smoking tobacco can be bad for your health. It can also affect almost every organ in your body. Smoking puts you and people around you at risk for many serious long-lasting (chronic) diseases. Quitting smoking is hard, but it is one of the best things that you can do for your health. It is never too late to quit. What are the benefits of quitting smoking? When you quit smoking, you lower your risk for getting serious diseases and conditions. They can include:  Lung cancer or lung disease.  Heart disease.  Stroke.  Heart attack.  Not being able to have children (infertility).  Weak bones (osteoporosis) and broken bones (fractures). If you have coughing, wheezing, and shortness of breath, those symptoms may get better when you quit. You may also get sick less often. If you are pregnant, quitting smoking can help to lower your chances of having a baby of low birth weight. What can I do to help me quit smoking? Talk with your doctor about what can help you quit smoking. Some things you can do (strategies) include:  Quitting smoking totally, instead of slowly cutting back how much you smoke over a period of time.  Going to in-person counseling. You are more likely to quit if you go to many counseling sessions.  Using resources and support systems, such as:  Online chats with a counselor.  Phone quitlines.  Printed self-help materials.  Support groups or group counseling.  Text messaging programs.  Mobile phone apps or applications.  Taking medicines. Some of these medicines may have nicotine in them. If you are pregnant or breastfeeding, do not take any medicines to quit smoking unless your doctor says it is okay. Talk with your doctor about counseling or other things that can help you. Talk with your doctor about using more than one strategy at the same time, such as taking medicines while you are also going to in-person counseling. This can help make quitting  easier. What things can I do to make it easier to quit? Quitting smoking might feel very hard at first, but there is a lot that you can do to make it easier. Take these steps:  Talk to your family and friends. Ask them to support and encourage you.  Call phone quitlines, reach out to support groups, or work with a counselor.  Ask people who smoke to not smoke around you.  Avoid places that make you want (trigger) to smoke, such as:  Bars.  Parties.  Smoke-break areas at work.  Spend time with people who do not smoke.  Lower the stress in your life. Stress can make you want to smoke. Try these things to help your stress:  Getting regular exercise.  Deep-breathing exercises.  Yoga.  Meditating.  Doing a body scan. To do this, close your eyes, focus on one area of your body at a time from head to toe, and notice which parts of your body are tense. Try to relax the muscles in those areas.  Download or buy apps on your mobile phone or tablet that can help you stick to your quit plan. There are many free apps, such as QuitGuide from the CDC (Centers for Disease Control and Prevention). You can find more support from smokefree.gov and other websites. This information is not intended to replace advice given to you by your health care provider. Make sure you discuss any questions you have with your health care provider. Document Released: 12/23/2008 Document Revised: 10/25/2015 Document   Reviewed: 07/13/2014 Elsevier Interactive Patient Education  2017 Elsevier Inc.  

## 2016-07-26 NOTE — Telephone Encounter (Signed)
Gave patient AVS and calender per 5/17 los. Lab and f/u in 3 months

## 2016-08-21 DIAGNOSIS — B009 Herpesviral infection, unspecified: Secondary | ICD-10-CM | POA: Diagnosis not present

## 2016-09-06 DIAGNOSIS — M10071 Idiopathic gout, right ankle and foot: Secondary | ICD-10-CM | POA: Diagnosis not present

## 2016-09-28 DIAGNOSIS — S46311D Strain of muscle, fascia and tendon of triceps, right arm, subsequent encounter: Secondary | ICD-10-CM | POA: Diagnosis not present

## 2016-09-28 DIAGNOSIS — M7701 Medial epicondylitis, right elbow: Secondary | ICD-10-CM | POA: Diagnosis not present

## 2016-09-28 DIAGNOSIS — E291 Testicular hypofunction: Secondary | ICD-10-CM | POA: Diagnosis not present

## 2016-10-17 DIAGNOSIS — E291 Testicular hypofunction: Secondary | ICD-10-CM | POA: Diagnosis not present

## 2016-10-25 ENCOUNTER — Ambulatory Visit: Payer: BLUE CROSS/BLUE SHIELD | Admitting: Internal Medicine

## 2016-10-25 ENCOUNTER — Other Ambulatory Visit: Payer: BLUE CROSS/BLUE SHIELD

## 2016-10-29 ENCOUNTER — Telehealth: Payer: Self-pay | Admitting: Internal Medicine

## 2016-10-29 ENCOUNTER — Other Ambulatory Visit (HOSPITAL_BASED_OUTPATIENT_CLINIC_OR_DEPARTMENT_OTHER): Payer: BLUE CROSS/BLUE SHIELD

## 2016-10-29 ENCOUNTER — Ambulatory Visit (HOSPITAL_BASED_OUTPATIENT_CLINIC_OR_DEPARTMENT_OTHER): Payer: BLUE CROSS/BLUE SHIELD | Admitting: Internal Medicine

## 2016-10-29 ENCOUNTER — Encounter: Payer: Self-pay | Admitting: Internal Medicine

## 2016-10-29 ENCOUNTER — Ambulatory Visit (HOSPITAL_BASED_OUTPATIENT_CLINIC_OR_DEPARTMENT_OTHER): Payer: BLUE CROSS/BLUE SHIELD

## 2016-10-29 ENCOUNTER — Encounter (INDEPENDENT_AMBULATORY_CARE_PROVIDER_SITE_OTHER): Payer: Self-pay

## 2016-10-29 VITALS — BP 126/73 | HR 87 | Temp 98.5°F | Resp 19 | Ht 70.0 in | Wt 202.4 lb

## 2016-10-29 DIAGNOSIS — D751 Secondary polycythemia: Secondary | ICD-10-CM

## 2016-10-29 LAB — COMPREHENSIVE METABOLIC PANEL
ALT: 25 U/L (ref 0–55)
AST: 19 U/L (ref 5–34)
Albumin: 3.7 g/dL (ref 3.5–5.0)
Alkaline Phosphatase: 69 U/L (ref 40–150)
Anion Gap: 8 mEq/L (ref 3–11)
BUN: 20.8 mg/dL (ref 7.0–26.0)
CO2: 23 mEq/L (ref 22–29)
Calcium: 9.7 mg/dL (ref 8.4–10.4)
Chloride: 109 mEq/L (ref 98–109)
Creatinine: 1.2 mg/dL (ref 0.7–1.3)
EGFR: 68 mL/min/{1.73_m2} — ABNORMAL LOW (ref >90)
Glucose: 104 mg/dl (ref 70–140)
Potassium: 4.1 mEq/L (ref 3.5–5.1)
Sodium: 140 mEq/L (ref 136–145)
Total Bilirubin: 0.37 mg/dL (ref 0.20–1.20)
Total Protein: 7.2 g/dL (ref 6.4–8.3)

## 2016-10-29 LAB — CBC WITH DIFFERENTIAL/PLATELET
BASO%: 0.2 % (ref 0.0–2.0)
Basophils Absolute: 0 10*3/uL (ref 0.0–0.1)
EOS%: 2.1 % (ref 0.0–7.0)
Eosinophils Absolute: 0.1 10*3/uL (ref 0.0–0.5)
HCT: 50.1 % — ABNORMAL HIGH (ref 38.4–49.9)
HGB: 16.9 g/dL (ref 13.0–17.1)
LYMPH%: 19.9 % (ref 14.0–49.0)
MCH: 31.6 pg (ref 27.2–33.4)
MCHC: 33.7 g/dL (ref 32.0–36.0)
MCV: 93.8 fL (ref 79.3–98.0)
MONO#: 0.6 10*3/uL (ref 0.1–0.9)
MONO%: 9 % (ref 0.0–14.0)
NEUT#: 4.4 10*3/uL (ref 1.5–6.5)
NEUT%: 68.8 % (ref 39.0–75.0)
Platelets: 200 10*3/uL (ref 140–400)
RBC: 5.34 10*6/uL (ref 4.20–5.82)
RDW: 13.8 % (ref 11.0–14.6)
WBC: 6.3 10*3/uL (ref 4.0–10.3)
lymph#: 1.3 10*3/uL (ref 0.9–3.3)

## 2016-10-29 NOTE — Telephone Encounter (Signed)
Scheduled appt per 8/20 los - sent reminder letter in the mail.

## 2016-10-29 NOTE — Progress Notes (Signed)
James [name] presents today for phlebotomy per MD orders. Phlebotomy procedure started at 1445 and ended at 1458 via 16G to L AC and 166 grams removed prior to clotting. 20G IV placed at 1500 in L AC and second attempted ended at 1515 with 330G removed.  Patient observed for 30 minutes after procedure without any incident. Patient tolerated procedure well. Diet and nutrition offered. IV needle removed intact.

## 2016-10-29 NOTE — Progress Notes (Signed)
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   PRIOR THERAPY: None   CURRENT THERAPY: Phlebotomy on an as-needed basis noted to keep the hematocrit at 45% or less.  INTERVAL HISTORY: James [name] 56 y.o. male returns to the clinic today for three-month follow-up visit. The patient is feeling fine today with no specific complaints. He denied having any weight loss or night sweats. He has no nausea, vomiting, diarrhea or constipation. He denied having any chest pain, shortness of breath, cough or hemoptysis. He is planning to get married in November 2018. He is here today for evaluation and repeat blood work and phlebotomy as needed.  MEDICAL HISTORY: Past Medical History:  Diagnosis Date  . Anemia    slight anemia with hepatitis tx  . Arthritis    oa  . Cancer ([place]) 4 months ago   basal cell skin cancer removed from chest area  . GERD (gastroesophageal reflux disease)   . Hepatitis C 04/29/2012   had savalta treatment 2 years ago, now cured  . Palpitations    at times     ALLERGIES:  has No Known Allergies.  MEDICATIONS:  Current Outpatient Prescriptions  Medication Sig Dispense Refill  . Colchicine 0.6 MG CAPS TAKE 2 CAPSULES ONE TIME THEN REPEAT WITH 1 CAPSULE 1 HOUR LATER  0  . colchicine 0.6 MG tablet   0  . CVS VITAMIN C 500 MG tablet TAKE 1 (ONE) TABLET TWO TIMES DAILY  0  . docusate sodium (COLACE) 100 MG capsule TAKE 1 CAPSULE TWO TIMES DAILY  0  . HYDROcodone-acetaminophen (NORCO) 10-325 MG tablet Take 1 tablet by mouth 4 (four) times daily.  0  . indomethacin (INDOCIN) 50 MG capsule Take 50 mg by mouth 3 (three) times daily.  0  . methocarbamol (ROBAXIN) 500 MG tablet TAKE 1 TABLET BY MOUTH EVERY 6 HOURS AS NEEDED FOR SPASMS  0  . oxyCODONE-acetaminophen (PERCOCET) 10-325  MG tablet Take 1 tablet by mouth 4 (four) times daily.  0  . TESTIM 50 MG/5GM GEL Place 5 g onto the skin daily. Apply to chest    . valACYclovir (VALTREX) 1000 MG tablet Take 1,000 mg by mouth daily as needed (for fever blisters). Reported on 09/22/2015  2   No current facility-administered medications for this visit.     REVIEW OF SYSTEMS:  A comprehensive review of systems was negative.   PHYSICAL EXAMINATION: General appearance: alert, cooperative and no distress Head: Normocephalic, without obvious abnormality, atraumatic Neck: no adenopathy, no JVD, supple, symmetrical, trachea midline and thyroid not enlarged, symmetric, no tenderness/mass/nodules Lymph nodes: Cervical, supraclavicular, and axillary nodes normal. Resp: clear to auscultation bilaterally Back: symmetric, no curvature. ROM normal. No CVA tenderness. Cardio: regular rate and rhythm, S1, S2 normal, no murmur, click, rub or gallop GI: soft, non-tender; bowel sounds normal; no masses,  no organomegaly Extremities: extremities normal, atraumatic, no cyanosis or edema  ECOG PERFORMANCE STATUS: 0 - Asymptomatic  Blood pressure 126/73, pulse 87, temperature 98.5 F (36.9 C), temperature source Oral, resp. rate 19, height 5\' 10"  (1.778 m), weight 202 lb 6.4 oz (91.8 kg), SpO2 98 %.  LABORATORY DATA: Lab Results  Component Value Date   WBC 6.3 10/29/2016   HGB 16.9 10/29/2016   HCT 50.1 (H) 10/29/2016   MCV 93.8 10/29/2016  PLT 200 10/29/2016      Chemistry      Component Value Date/Time   NA 139 07/26/2016 1104   K 4.1 07/26/2016 1104   CL 103 01/12/2015 0430   CL 106 08/06/2012 1536   CO2 22 07/26/2016 1104   BUN 23.2 07/26/2016 1104   CREATININE 1.1 07/26/2016 1104      Component Value Date/Time   CALCIUM 9.2 07/26/2016 1104   ALKPHOS 53 07/26/2016 1104   AST 20 07/26/2016 1104   ALT 28 07/26/2016 1104   BILITOT 0.52 07/26/2016 1104       RADIOGRAPHIC STUDIES: No results found.  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 The patient has been undergoing phlebotomy on as-needed basis to keep his hematocrit around 45%. His hematocrit today is 50.1% I recommended for him to proceed with phlebotomy today. I will see him back for follow-up visit in 3 months with repeat CBC and phlebotomy if needed. He was advised to call immediately if he has any concerning symptoms in the interval. The patient voices understanding of current disease status and treatment options and is in agreement with the current care plan. All questions were answered. The patient knows to call the clinic with any problems, questions or concerns. We can certainly see the patient much sooner if necessary. I spent 10 minutes counseling the patient face to face. The total time spent in the appointment was 15 minutes. Disclaimer: This note was dictated with voice recognition software. Similar sounding words can inadvertently be transcribed and may not be corrected upon review.

## 2016-10-30 DIAGNOSIS — E291 Testicular hypofunction: Secondary | ICD-10-CM | POA: Diagnosis not present

## 2016-11-06 DIAGNOSIS — M109 Gout, unspecified: Secondary | ICD-10-CM | POA: Diagnosis not present

## 2016-12-06 DIAGNOSIS — L905 Scar conditions and fibrosis of skin: Secondary | ICD-10-CM | POA: Diagnosis not present

## 2016-12-06 DIAGNOSIS — Z85828 Personal history of other malignant neoplasm of skin: Secondary | ICD-10-CM | POA: Diagnosis not present

## 2016-12-06 DIAGNOSIS — L570 Actinic keratosis: Secondary | ICD-10-CM | POA: Diagnosis not present

## 2016-12-06 DIAGNOSIS — L089 Local infection of the skin and subcutaneous tissue, unspecified: Secondary | ICD-10-CM | POA: Diagnosis not present

## 2016-12-12 DIAGNOSIS — M109 Gout, unspecified: Secondary | ICD-10-CM | POA: Diagnosis not present

## 2016-12-28 DIAGNOSIS — L259 Unspecified contact dermatitis, unspecified cause: Secondary | ICD-10-CM | POA: Diagnosis not present

## 2017-01-15 DIAGNOSIS — H018 Other specified inflammations of eyelid: Secondary | ICD-10-CM | POA: Diagnosis not present

## 2017-01-15 DIAGNOSIS — L259 Unspecified contact dermatitis, unspecified cause: Secondary | ICD-10-CM | POA: Diagnosis not present

## 2017-01-29 ENCOUNTER — Encounter: Payer: Self-pay | Admitting: Internal Medicine

## 2017-01-29 ENCOUNTER — Telehealth: Payer: Self-pay | Admitting: Internal Medicine

## 2017-01-29 ENCOUNTER — Ambulatory Visit (HOSPITAL_BASED_OUTPATIENT_CLINIC_OR_DEPARTMENT_OTHER): Payer: BLUE CROSS/BLUE SHIELD

## 2017-01-29 ENCOUNTER — Other Ambulatory Visit (HOSPITAL_BASED_OUTPATIENT_CLINIC_OR_DEPARTMENT_OTHER): Payer: BLUE CROSS/BLUE SHIELD

## 2017-01-29 ENCOUNTER — Ambulatory Visit (HOSPITAL_BASED_OUTPATIENT_CLINIC_OR_DEPARTMENT_OTHER): Payer: BLUE CROSS/BLUE SHIELD | Admitting: Internal Medicine

## 2017-01-29 VITALS — BP 119/88 | HR 98 | Temp 98.6°F | Resp 17 | Ht 70.0 in | Wt 203.6 lb

## 2017-01-29 DIAGNOSIS — Z720 Tobacco use: Secondary | ICD-10-CM | POA: Diagnosis not present

## 2017-01-29 DIAGNOSIS — H0011 Chalazion right upper eyelid: Secondary | ICD-10-CM | POA: Diagnosis not present

## 2017-01-29 DIAGNOSIS — H01114 Allergic dermatitis of left upper eyelid: Secondary | ICD-10-CM | POA: Diagnosis not present

## 2017-01-29 DIAGNOSIS — H01111 Allergic dermatitis of right upper eyelid: Secondary | ICD-10-CM | POA: Diagnosis not present

## 2017-01-29 DIAGNOSIS — D751 Secondary polycythemia: Secondary | ICD-10-CM

## 2017-01-29 DIAGNOSIS — H01112 Allergic dermatitis of right lower eyelid: Secondary | ICD-10-CM | POA: Diagnosis not present

## 2017-01-29 LAB — CBC WITH DIFFERENTIAL/PLATELET
BASO%: 0.1 % (ref 0.0–2.0)
Basophils Absolute: 0 10*3/uL (ref 0.0–0.1)
EOS%: 1.5 % (ref 0.0–7.0)
Eosinophils Absolute: 0.1 10*3/uL (ref 0.0–0.5)
HCT: 51.7 % — ABNORMAL HIGH (ref 38.4–49.9)
HGB: 17.6 g/dL — ABNORMAL HIGH (ref 13.0–17.1)
LYMPH%: 17.4 % (ref 14.0–49.0)
MCH: 31 pg (ref 27.2–33.4)
MCHC: 34 g/dL (ref 32.0–36.0)
MCV: 91.2 fL (ref 79.3–98.0)
MONO#: 0.6 10*3/uL (ref 0.1–0.9)
MONO%: 6.3 % (ref 0.0–14.0)
NEUT#: 6.5 10*3/uL (ref 1.5–6.5)
NEUT%: 74.7 % (ref 39.0–75.0)
Platelets: 198 10*3/uL (ref 140–400)
RBC: 5.67 10*6/uL (ref 4.20–5.82)
RDW: 13.2 % (ref 11.0–14.6)
WBC: 8.7 10*3/uL (ref 4.0–10.3)
lymph#: 1.5 10*3/uL (ref 0.9–3.3)
nRBC: 0 % (ref 0–0)

## 2017-01-29 NOTE — Patient Instructions (Signed)

## 2017-01-29 NOTE — Addendum Note (Signed)
Addended by: Lucile Crater on: 01/29/2017 02:12 PM   Modules accepted: Orders

## 2017-01-29 NOTE — Progress Notes (Signed)
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   PRIOR THERAPY: None   CURRENT THERAPY: Phlebotomy on an as-needed basis noted to keep the hematocrit at 45% or less.  INTERVAL HISTORY: James [name] 56 y.o. male returns to the clinic today for 3 months follow-up visit.  The patient is feeling fine today with no specific complaints.  He denied having any fatigue or weakness.,  Shortness of breath, cough or hemoptysis.  He has no fever or chills.  He denied having any bleeding issues.  He has no significant weight loss or night sweats.  He continues to smoke cigars but he also on androgen therapy.  He is here today for evaluation and repeat blood work.  MEDICAL HISTORY: Past Medical History:  Diagnosis Date  . Anemia    slight anemia with hepatitis tx  . Arthritis    oa  . Cancer ([place]) 4 months ago   basal cell skin cancer removed from chest area  . GERD (gastroesophageal reflux disease)   . Hepatitis C 04/29/2012   had savalta treatment 2 years ago, now cured  . Palpitations    at times     ALLERGIES:  has No Known Allergies.  MEDICATIONS:  Current Outpatient Medications  Medication Sig Dispense Refill  . Colchicine 0.6 MG CAPS TAKE 2 CAPSULES ONE TIME THEN REPEAT WITH 1 CAPSULE 1 HOUR LATER  0  . colchicine 0.6 MG tablet   0  . CVS VITAMIN C 500 MG tablet TAKE 1 (ONE) TABLET TWO TIMES DAILY  0  . docusate sodium (COLACE) 100 MG capsule TAKE 1 CAPSULE TWO TIMES DAILY  0  . HYDROcodone-acetaminophen (NORCO) 10-325 MG tablet Take 1 tablet by mouth 4 (four) times daily.  0  . indomethacin (INDOCIN) 50 MG capsule Take 50 mg by mouth 3 (three) times daily.  0  . methocarbamol (ROBAXIN) 500 MG tablet TAKE 1 TABLET BY MOUTH EVERY 6 HOURS AS NEEDED FOR SPASMS  0  . oxyCODONE-acetaminophen  (PERCOCET) 10-325 MG tablet Take 1 tablet by mouth 4 (four) times daily.  0  . TESTIM 50 MG/5GM GEL Place 5 g onto the skin daily. Apply to chest    . valACYclovir (VALTREX) 1000 MG tablet Take 1,000 mg by mouth daily as needed (for fever blisters). Reported on 09/22/2015  2   No current facility-administered medications for this visit.     REVIEW OF SYSTEMS:  A comprehensive review of systems was negative.   PHYSICAL EXAMINATION: General appearance: alert, cooperative and no distress Head: Normocephalic, without obvious abnormality, atraumatic Neck: no adenopathy, no JVD, supple, symmetrical, trachea midline and thyroid not enlarged, symmetric, no tenderness/mass/nodules Lymph nodes: Cervical, supraclavicular, and axillary nodes normal. Resp: clear to auscultation bilaterally Back: symmetric, no curvature. ROM normal. No CVA tenderness. Cardio: regular rate and rhythm, S1, S2 normal, no murmur, click, rub or gallop GI: soft, non-tender; bowel sounds normal; no masses,  no organomegaly Extremities: extremities normal, atraumatic, no cyanosis or edema  ECOG PERFORMANCE STATUS: 0 - Asymptomatic  Blood pressure 119/88, pulse 98, temperature 98.6 F (37 C), temperature source Oral, resp. rate 17, height 5\' 10"  (1.778 m), weight 203 lb 9.6 oz (92.4 kg), SpO2 97 %.  LABORATORY DATA: Lab Results  Component Value Date   WBC 8.7 01/29/2017   HGB 17.6 (H) 01/29/2017  HCT 51.7 (H) 01/29/2017   MCV 91.2 01/29/2017   PLT 198 01/29/2017      Chemistry      Component Value Date/Time   NA 140 10/29/2016 1353   K 4.1 10/29/2016 1353   CL 103 01/12/2015 0430   CL 106 08/06/2012 1536   CO2 23 10/29/2016 1353   BUN 20.8 10/29/2016 1353   CREATININE 1.2 10/29/2016 1353      Component Value Date/Time   CALCIUM 9.7 10/29/2016 1353   ALKPHOS 69 10/29/2016 1353   AST 19 10/29/2016 1353   ALT 25 10/29/2016 1353   BILITOT 0.37 10/29/2016 1353       RADIOGRAPHIC STUDIES: No results  found.  ASSESSMENT AND PLAN:  This is a very pleasant 56 years old white male with reactive polycythemia secondary to androgen treatment. The patient has been undergoing phlebotomy on as-needed basis to keep his hematocrit around 45%. His hematocrit today is 51.7% I recommend for the patient to proceed with phlebotomy today. I will see him back for follow-up visit in 3 months for evaluation with repeat blood work as well as phlebotomy if needed. He was advised to call immediately if he has any concerning symptoms in the interval. The patient voices understanding of current disease status and treatment options and is in agreement with the current care plan. All questions were answered. The patient knows to call the clinic with any problems, questions or concerns. We can certainly see the patient much sooner if necessary. I spent 10 minutes counseling the patient face to face. The total time spent in the appointment was 15 minutes. Disclaimer: This note was dictated with voice recognition software. Similar sounding words can inadvertently be transcribed and may not be corrected upon review.

## 2017-01-29 NOTE — Progress Notes (Signed)
Therapeutic phlebotomy performed per MD order using 16 gauge Phlebotomy set in left AC starting at 1408 and ending at 1413 removing a total of 515 grams of blood. Pt tolerated procedure well. Snack and drink provided.  Pt monitored 30 minutes post procedure. Pt and VS stable at discharge.

## 2017-01-29 NOTE — Telephone Encounter (Signed)
Scheduled appt per 11/20 los - Gave patient AVS and calender per los.  

## 2017-01-30 DIAGNOSIS — E291 Testicular hypofunction: Secondary | ICD-10-CM | POA: Diagnosis not present

## 2017-02-06 DIAGNOSIS — N400 Enlarged prostate without lower urinary tract symptoms: Secondary | ICD-10-CM | POA: Diagnosis not present

## 2017-02-06 DIAGNOSIS — E291 Testicular hypofunction: Secondary | ICD-10-CM | POA: Diagnosis not present

## 2017-02-06 DIAGNOSIS — N5201 Erectile dysfunction due to arterial insufficiency: Secondary | ICD-10-CM | POA: Diagnosis not present

## 2017-03-20 DIAGNOSIS — L249 Irritant contact dermatitis, unspecified cause: Secondary | ICD-10-CM | POA: Diagnosis not present

## 2017-03-20 DIAGNOSIS — L570 Actinic keratosis: Secondary | ICD-10-CM | POA: Diagnosis not present

## 2017-03-29 ENCOUNTER — Emergency Department (HOSPITAL_COMMUNITY): Payer: BLUE CROSS/BLUE SHIELD

## 2017-03-29 ENCOUNTER — Other Ambulatory Visit: Payer: Self-pay

## 2017-03-29 ENCOUNTER — Encounter (HOSPITAL_COMMUNITY)
Admission: EM | Disposition: A | Discharge status: Home / Self Care | Payer: Self-pay | Source: Home / Self Care | Attending: Cardiology

## 2017-03-29 ENCOUNTER — Encounter (HOSPITAL_COMMUNITY): Payer: Self-pay

## 2017-03-29 ENCOUNTER — Inpatient Hospital Stay (HOSPITAL_COMMUNITY)
Admission: EM | Admit: 2017-03-29 | Discharge: 2017-04-01 | DRG: 228 | Disposition: A | Discharge status: Home / Self Care | Payer: BLUE CROSS/BLUE SHIELD | Attending: Cardiology | Admitting: Cardiology

## 2017-03-29 DIAGNOSIS — K219 Gastro-esophageal reflux disease without esophagitis: Secondary | ICD-10-CM | POA: Diagnosis not present

## 2017-03-29 DIAGNOSIS — D751 Secondary polycythemia: Secondary | ICD-10-CM | POA: Diagnosis not present

## 2017-03-29 DIAGNOSIS — F17290 Nicotine dependence, other tobacco product, uncomplicated: Secondary | ICD-10-CM | POA: Diagnosis present

## 2017-03-29 DIAGNOSIS — Z955 Presence of coronary angioplasty implant and graft: Secondary | ICD-10-CM

## 2017-03-29 DIAGNOSIS — E785 Hyperlipidemia, unspecified: Secondary | ICD-10-CM | POA: Diagnosis not present

## 2017-03-29 DIAGNOSIS — E876 Hypokalemia: Secondary | ICD-10-CM | POA: Diagnosis not present

## 2017-03-29 DIAGNOSIS — Z7982 Long term (current) use of aspirin: Secondary | ICD-10-CM | POA: Diagnosis not present

## 2017-03-29 DIAGNOSIS — Z96641 Presence of right artificial hip joint: Secondary | ICD-10-CM | POA: Diagnosis present

## 2017-03-29 DIAGNOSIS — I4729 Other ventricular tachycardia: Secondary | ICD-10-CM

## 2017-03-29 DIAGNOSIS — I5021 Acute systolic (congestive) heart failure: Secondary | ICD-10-CM

## 2017-03-29 DIAGNOSIS — Z79899 Other long term (current) drug therapy: Secondary | ICD-10-CM | POA: Diagnosis not present

## 2017-03-29 DIAGNOSIS — Z85828 Personal history of other malignant neoplasm of skin: Secondary | ICD-10-CM

## 2017-03-29 DIAGNOSIS — G4733 Obstructive sleep apnea (adult) (pediatric): Secondary | ICD-10-CM | POA: Diagnosis not present

## 2017-03-29 DIAGNOSIS — I472 Ventricular tachycardia: Secondary | ICD-10-CM | POA: Diagnosis not present

## 2017-03-29 DIAGNOSIS — Z8249 Family history of ischemic heart disease and other diseases of the circulatory system: Secondary | ICD-10-CM | POA: Diagnosis not present

## 2017-03-29 DIAGNOSIS — B1920 Unspecified viral hepatitis C without hepatic coma: Secondary | ICD-10-CM | POA: Diagnosis not present

## 2017-03-29 DIAGNOSIS — I361 Nonrheumatic tricuspid (valve) insufficiency: Secondary | ICD-10-CM | POA: Diagnosis not present

## 2017-03-29 DIAGNOSIS — R748 Abnormal levels of other serum enzymes: Secondary | ICD-10-CM | POA: Diagnosis not present

## 2017-03-29 DIAGNOSIS — R0602 Shortness of breath: Secondary | ICD-10-CM | POA: Diagnosis not present

## 2017-03-29 DIAGNOSIS — R079 Chest pain, unspecified: Secondary | ICD-10-CM | POA: Diagnosis not present

## 2017-03-29 DIAGNOSIS — R1013 Epigastric pain: Secondary | ICD-10-CM | POA: Diagnosis not present

## 2017-03-29 DIAGNOSIS — R897 Abnormal histological findings in specimens from other organs, systems and tissues: Secondary | ICD-10-CM | POA: Diagnosis not present

## 2017-03-29 DIAGNOSIS — I214 Non-ST elevation (NSTEMI) myocardial infarction: Secondary | ICD-10-CM | POA: Diagnosis not present

## 2017-03-29 DIAGNOSIS — B182 Chronic viral hepatitis C: Secondary | ICD-10-CM | POA: Diagnosis not present

## 2017-03-29 DIAGNOSIS — R0789 Other chest pain: Secondary | ICD-10-CM | POA: Diagnosis not present

## 2017-03-29 HISTORY — DX: Non-ST elevation (NSTEMI) myocardial infarction: I21.4

## 2017-03-29 HISTORY — DX: Hyperlipidemia, unspecified: E78.5

## 2017-03-29 HISTORY — PX: CORONARY STENT INTERVENTION: CATH118234

## 2017-03-29 HISTORY — DX: Acute systolic (congestive) heart failure: I50.21

## 2017-03-29 HISTORY — PX: LEFT HEART CATH AND CORONARY ANGIOGRAPHY: CATH118249

## 2017-03-29 HISTORY — PX: CORONARY THROMBECTOMY: CATH118304

## 2017-03-29 LAB — HEPATIC FUNCTION PANEL
ALT: 34 U/L (ref 17–63)
ALT: 36 U/L (ref 17–63)
AST: 97 U/L — ABNORMAL HIGH (ref 15–41)
AST: 166 U/L — ABNORMAL HIGH (ref 15–41)
Albumin: 4.2 g/dL (ref 3.5–5.0)
Albumin: 3.8 g/dL (ref 3.5–5.0)
Alkaline Phosphatase: 60 U/L (ref 38–126)
Alkaline Phosphatase: 54 U/L (ref 38–126)
Bilirubin, Direct: 0.1 mg/dL (ref 0.1–0.5)
Bilirubin, Direct: 0.2 mg/dL (ref 0.1–0.5)
Indirect Bilirubin: 0.3 mg/dL (ref 0.3–0.9)
Indirect Bilirubin: 0.7 mg/dL (ref 0.3–0.9)
Total Bilirubin: 0.4 mg/dL (ref 0.3–1.2)
Total Bilirubin: 0.9 mg/dL (ref 0.3–1.2)
Total Protein: 7.3 g/dL (ref 6.5–8.1)
Total Protein: 6.3 g/dL — ABNORMAL LOW (ref 6.5–8.1)

## 2017-03-29 LAB — CBC
HCT: 51.2 % (ref 39.0–52.0)
HCT: 47 % (ref 39.0–52.0)
Hemoglobin: 17.3 g/dL — ABNORMAL HIGH (ref 13.0–17.0)
Hemoglobin: 15.9 g/dL (ref 13.0–17.0)
MCH: 30.4 pg (ref 26.0–34.0)
MCH: 30.5 pg (ref 26.0–34.0)
MCHC: 33.8 g/dL (ref 30.0–36.0)
MCHC: 33.8 g/dL (ref 30.0–36.0)
MCV: 89.8 fL (ref 78.0–100.0)
MCV: 90.2 fL (ref 78.0–100.0)
Platelets: 241 10*3/uL (ref 150–400)
Platelets: 223 10*3/uL (ref 150–400)
RBC: 5.7 MIL/uL (ref 4.22–5.81)
RBC: 5.21 MIL/uL (ref 4.22–5.81)
RDW: 13.4 % (ref 11.5–15.5)
RDW: 13.4 % (ref 11.5–15.5)
WBC: 11.9 10*3/uL — ABNORMAL HIGH (ref 4.0–10.5)
WBC: 11.7 10*3/uL — ABNORMAL HIGH (ref 4.0–10.5)

## 2017-03-29 LAB — LIPASE, BLOOD: Lipase: 43 U/L (ref 11–51)

## 2017-03-29 LAB — BASIC METABOLIC PANEL
Anion gap: 6 (ref 5–15)
BUN: 26 mg/dL — ABNORMAL HIGH (ref 6–20)
CO2: 26 mmol/L (ref 22–32)
Calcium: 9.4 mg/dL (ref 8.9–10.3)
Chloride: 108 mmol/L (ref 101–111)
Creatinine, Ser: 1.09 mg/dL (ref 0.61–1.24)
GFR calc Af Amer: >60 mL/min (ref >60)
GFR calc non Af Amer: >60 mL/min (ref >60)
Glucose, Bld: 92 mg/dL (ref 65–99)
Potassium: 4 mmol/L (ref 3.5–5.1)
Sodium: 140 mmol/L (ref 135–145)

## 2017-03-29 LAB — APTT: aPTT: 27 seconds (ref 24–36)

## 2017-03-29 LAB — HEMOGLOBIN A1C
Hgb A1c MFr Bld: 5.5 % (ref 4.8–5.6)
Mean Plasma Glucose: 111.15 mg/dL

## 2017-03-29 LAB — POCT ACTIVATED CLOTTING TIME
Activated Clotting Time: 191 seconds
Activated Clotting Time: 340 seconds

## 2017-03-29 LAB — PROTIME-INR
INR: 0.94
Prothrombin Time: 12.5 seconds (ref 11.4–15.2)

## 2017-03-29 LAB — I-STAT TROPONIN, ED: Troponin i, poc: 9.7 ng/mL (ref 0.00–0.08)

## 2017-03-29 LAB — CREATININE, SERUM
Creatinine, Ser: 1.01 mg/dL (ref 0.61–1.24)
GFR calc Af Amer: >60 mL/min (ref >60)
GFR calc non Af Amer: >60 mL/min (ref >60)

## 2017-03-29 LAB — MRSA PCR SCREENING: MRSA by PCR: NEGATIVE

## 2017-03-29 LAB — TROPONIN I: Troponin I: 32.8 ng/mL (ref <0.03)

## 2017-03-29 SURGERY — LEFT HEART CATH AND CORONARY ANGIOGRAPHY
Anesthesia: LOCAL

## 2017-03-29 MED ORDER — SODIUM CHLORIDE 0.9% FLUSH: 3.0000 mL | INTRAVENOUS | Status: DC | PRN

## 2017-03-29 MED ORDER — FENTANYL CITRATE (PF) 100 MCG/2ML IJ SOLN
INTRAMUSCULAR | Status: DC | PRN
  Administered 2017-03-29 (×2): 25 ug via INTRAVENOUS

## 2017-03-29 MED ORDER — ASPIRIN 81 MG PO CHEW
81.0000 mg | CHEWABLE_TABLET | Freq: Every day | ORAL | Status: DC
  Administered 2017-03-29 – 2017-04-01 (×4): 81 mg via ORAL
  Filled 2017-03-29 (×4): qty 1

## 2017-03-29 MED ORDER — TICAGRELOR 90 MG PO TABS
90.0000 mg | ORAL_TABLET | Freq: Two times a day (BID) | ORAL | Status: DC
  Administered 2017-03-29 – 2017-04-01 (×6): 90 mg via ORAL
  Filled 2017-03-29 (×6): qty 1

## 2017-03-29 MED ORDER — ONDANSETRON HCL 4 MG/2ML IJ SOLN: 4.0000 mg | Freq: Four times a day (QID) | INTRAMUSCULAR | Status: DC | PRN

## 2017-03-29 MED ORDER — ENOXAPARIN SODIUM 40 MG/0.4ML ~~LOC~~ SOLN
40.0000 mg | SUBCUTANEOUS | Status: DC
  Administered 2017-03-30: 40 mg via SUBCUTANEOUS
  Filled 2017-03-29: qty 0.4

## 2017-03-29 MED ORDER — TIROFIBAN HCL IN NACL 5-0.9 MG/100ML-% IV SOLN
INTRAVENOUS | Status: AC
  Filled 2017-03-29: qty 100

## 2017-03-29 MED ORDER — MIDAZOLAM HCL 2 MG/2ML IJ SOLN
INTRAMUSCULAR | Status: AC
  Filled 2017-03-29: qty 2

## 2017-03-29 MED ORDER — TICAGRELOR 90 MG PO TABS
ORAL_TABLET | ORAL | Status: DC | PRN
  Administered 2017-03-29: 180 mg via ORAL

## 2017-03-29 MED ORDER — TICAGRELOR 90 MG PO TABS
ORAL_TABLET | ORAL | Status: AC
  Filled 2017-03-29: qty 2

## 2017-03-29 MED ORDER — HEPARIN SODIUM (PORCINE) 1000 UNIT/ML IJ SOLN
INTRAMUSCULAR | Status: DC | PRN
  Administered 2017-03-29: 2500 [IU] via INTRAVENOUS
  Administered 2017-03-29: 4500 [IU] via INTRAVENOUS
  Administered 2017-03-29: 3000 [IU] via INTRAVENOUS

## 2017-03-29 MED ORDER — TIROFIBAN (AGGRASTAT) BOLUS VIA INFUSION
INTRAVENOUS | Status: DC | PRN
  Administered 2017-03-29: 2267.5 ug via INTRAVENOUS

## 2017-03-29 MED ORDER — ATORVASTATIN CALCIUM 80 MG PO TABS
80.0000 mg | ORAL_TABLET | Freq: Every day | ORAL | Status: DC
  Administered 2017-03-30 – 2017-03-31 (×2): 80 mg via ORAL
  Filled 2017-03-29 (×2): qty 1

## 2017-03-29 MED ORDER — SODIUM CHLORIDE 0.9% FLUSH
3.0000 mL | Freq: Two times a day (BID) | INTRAVENOUS | Status: DC
  Administered 2017-03-30 – 2017-03-31 (×2): 3 mL via INTRAVENOUS

## 2017-03-29 MED ORDER — LIDOCAINE HCL (PF) 1 % IJ SOLN
INTRAMUSCULAR | Status: AC
  Filled 2017-03-29: qty 30

## 2017-03-29 MED ORDER — TIROFIBAN HCL IN NACL 5-0.9 MG/100ML-% IV SOLN
0.1500 ug/kg/min | INTRAVENOUS | Status: AC
  Administered 2017-03-29 – 2017-03-30 (×2): 0.15 ug/kg/min via INTRAVENOUS
  Filled 2017-03-29 (×2): qty 100

## 2017-03-29 MED ORDER — NITROGLYCERIN 1 MG/10 ML FOR IR/CATH LAB
INTRA_ARTERIAL | Status: DC | PRN
  Administered 2017-03-29: 200 ug via INTRACORONARY

## 2017-03-29 MED ORDER — MIDAZOLAM HCL 2 MG/2ML IJ SOLN
INTRAMUSCULAR | Status: DC | PRN
  Administered 2017-03-29 (×2): 1 mg via INTRAVENOUS

## 2017-03-29 MED ORDER — HEPARIN (PORCINE) IN NACL 2-0.9 UNIT/ML-% IJ SOLN
INTRAMUSCULAR | Status: AC
  Filled 2017-03-29: qty 1000

## 2017-03-29 MED ORDER — NITROGLYCERIN IN D5W 200-5 MCG/ML-% IV SOLN
0.0000 ug/min | Freq: Once | INTRAVENOUS | Status: AC
  Administered 2017-03-29: 5 ug/min via INTRAVENOUS
  Filled 2017-03-29: qty 250

## 2017-03-29 MED ORDER — IOPAMIDOL (ISOVUE-370) INJECTION 76%
INTRAVENOUS | Status: DC | PRN
  Administered 2017-03-29: 180 mL via INTRA_ARTERIAL

## 2017-03-29 MED ORDER — SODIUM CHLORIDE 0.9 % IV SOLN: 250.0000 mL | INTRAVENOUS | Status: DC | PRN

## 2017-03-29 MED ORDER — ALPRAZOLAM 0.5 MG PO TABS
0.5000 mg | ORAL_TABLET | Freq: Once | ORAL | Status: AC
  Administered 2017-03-29: 0.5 mg via ORAL
  Filled 2017-03-29: qty 1

## 2017-03-29 MED ORDER — SODIUM CHLORIDE 0.9 % WEIGHT BASED INFUSION: 1.0000 mL/kg/h | INTRAVENOUS | Status: AC

## 2017-03-29 MED ORDER — HEPARIN (PORCINE) IN NACL 2-0.9 UNIT/ML-% IJ SOLN
INTRAMUSCULAR | Status: DC | PRN
  Administered 2017-03-29: 1000 mL

## 2017-03-29 MED ORDER — FENTANYL CITRATE (PF) 100 MCG/2ML IJ SOLN
INTRAMUSCULAR | Status: AC
Start: 2017-03-29 — End: 2017-03-29
  Filled 2017-03-29: qty 2

## 2017-03-29 MED ORDER — HEPARIN (PORCINE) IN NACL 100-0.45 UNIT/ML-% IJ SOLN
1100.0000 [IU]/h | INTRAMUSCULAR | Status: DC
  Filled 2017-03-29: qty 250

## 2017-03-29 MED ORDER — TIROFIBAN HCL IN NACL 5-0.9 MG/100ML-% IV SOLN
INTRAVENOUS | Status: DC | PRN
  Administered 2017-03-29: 0.15 ug/kg/min via INTRAVENOUS

## 2017-03-29 MED ORDER — IOPAMIDOL (ISOVUE-370) INJECTION 76%
INTRAVENOUS | Status: AC
  Filled 2017-03-29: qty 100

## 2017-03-29 MED ORDER — HEPARIN BOLUS VIA INFUSION
4000.0000 [IU] | Freq: Once | INTRAVENOUS | Status: DC
  Filled 2017-03-29: qty 4000

## 2017-03-29 MED ORDER — ACETAMINOPHEN 325 MG PO TABS
650.0000 mg | ORAL_TABLET | ORAL | Status: DC | PRN
  Administered 2017-03-31: 650 mg via ORAL
  Filled 2017-03-29: qty 2

## 2017-03-29 MED ORDER — VERAPAMIL HCL 2.5 MG/ML IV SOLN
INTRAVENOUS | Status: DC | PRN
  Administered 2017-03-29: 10 mL via INTRA_ARTERIAL

## 2017-03-29 MED ORDER — LIDOCAINE HCL (PF) 1 % IJ SOLN
INTRAMUSCULAR | Status: DC | PRN
  Administered 2017-03-29: 2 mL via SUBCUTANEOUS

## 2017-03-29 MED ORDER — METOPROLOL SUCCINATE ER 25 MG PO TB24
25.0000 mg | ORAL_TABLET | Freq: Every day | ORAL | Status: DC
  Administered 2017-03-29 – 2017-04-01 (×4): 25 mg via ORAL
  Filled 2017-03-29 (×4): qty 1

## 2017-03-29 MED ORDER — NITROGLYCERIN 1 MG/10 ML FOR IR/CATH LAB
INTRA_ARTERIAL | Status: AC
  Filled 2017-03-29: qty 10

## 2017-03-29 SURGICAL SUPPLY — 20 items
BALLN SAPPHIRE 2.5X15 (BALLOONS) ×2
BALLOON SAPPHIRE 2.5X15 (BALLOONS) ×1 IMPLANT
CATH 5FR JL3.5 JR4 ANG PIG MP (CATHETERS) ×2 IMPLANT
CATH EXTRAC PRONTO 5.5F 138CM (CATHETERS) ×2 IMPLANT
CATH LAUNCHER 5F RADR (CATHETERS) ×1 IMPLANT
CATH VISTA GUIDE 6FR XBLAD3.5 (CATHETERS) ×2 IMPLANT
CATHETER LAUNCHER 5F RADR (CATHETERS) ×2
DEVICE RAD COMP TR BAND LRG (VASCULAR PRODUCTS) ×2 IMPLANT
ELECT DEFIB PAD ADLT CADENCE (PAD) ×2 IMPLANT
GLIDESHEATH SLEND SS 6F .021 (SHEATH) ×2 IMPLANT
GUIDEWIRE INQWIRE 1.5J.035X260 (WIRE) ×1 IMPLANT
INQWIRE 1.5J .035X260CM (WIRE) ×2
KIT ENCORE 26 ADVANTAGE (KITS) ×2 IMPLANT
KIT HEART LEFT (KITS) ×2 IMPLANT
PACK CARDIAC CATHETERIZATION (CUSTOM PROCEDURE TRAY) ×2 IMPLANT
STENT SYNERGY DES 3.5X16 (Permanent Stent) ×2 IMPLANT
SYR MEDRAD MARK V 150ML (SYRINGE) ×2 IMPLANT
TRANSDUCER W/STOPCOCK (MISCELLANEOUS) ×2 IMPLANT
TUBING CIL FLEX 10 FLL-RA (TUBING) ×2 IMPLANT
WIRE ASAHI PROWATER 180CM (WIRE) ×2 IMPLANT

## 2017-03-29 NOTE — Progress Notes (Signed)
ANTICOAGULATION CONSULT NOTE - Initial Consult  Pharmacy Consult for heparin Indication: chest pain/ACS  No Known Allergies  Patient Measurements: Height: 5' 9.5" (176.5 cm) Weight: 200 lb (90.7 kg) IBW/kg (Calculated) : 71.85 Heparin Dosing Weight: 90 kg  Vital Signs: Temp: 98 F (36.7 C) (01/18 1332) Temp Source: Oral (01/18 1332) BP: 138/103 (01/18 1557) Pulse Rate: 90 (01/18 1557)  Labs: Recent Labs    03/29/17 1525  HGB 17.3*  HCT 51.2  PLT 241    CrCl cannot be calculated (Patient's most recent lab result is older than the maximum 21 days allowed.).   Medical History: Past Medical History:  Diagnosis Date  . Anemia    slight anemia with hepatitis tx  . Arthritis    oa  . Cancer ([place]) 4 months ago   basal cell skin cancer removed from chest area  . GERD (gastroesophageal reflux disease)   . Hepatitis C 04/29/2012   had savalta treatment 2 years ago, now cured  . Palpitations    at times      Assessment: 9 yoM presents with elevated troponin. To begin heparin for ACS.  Hgb and Plts WNL Baseline anticoag labs: PT/INR, aPTT ordered STAT  Goal of Therapy:  Heparin level 0.3-0.7 units/ml Monitor platelets by anticoagulation protocol: Yes   Plan:  Following collection of anticoag labs, give Heparin 4000 unit bolus x 1 then start infusion at 1100 units/hr. Check heparin level in 6 hours. Daily CBC and heparin level.  Hershal Coria 03/29/2017,4:20 PM

## 2017-03-29 NOTE — ED Notes (Addendum)
MD from [facility] stating patient had blood work and had troponin of 0.6 Ralene Bathe, MD notified.

## 2017-03-29 NOTE — H&P (Signed)
Cardiology Admission History and Physical:   Patient ID: James [name]; MRN: [id number]; DOB: Jul 29, 1960   Admission date: 03/29/2017  Primary Care Provider: [place] Primary Cardiologist:  [place], Dr [place] Primary Electrophysiologist:  n/a  Chief Complaint:  NSTEMI  Patient Profile:   James [name] is a 57 y.o. male with a history of Hep C s/p   History of Present Illness:   Mr. Duval had a big meal 2 days ago. Then he developed an aching in his throat. He took Tums without relief. The pain eventually eased off. The pain was a pressure, 5/10. He was not SOB, got throat tightness. He hurt behind his ears. No N&V or diaphoresis.   He had additional pain with exertion. The sx finally worsened to the point that he sought medical help. His PCP did a troponin and then told him to come to the ER.   He currently has DOE and orthopnea, but not SOB at rest. He is still having CP at a 2/10. His is on IV Nitro, IV NS. He has had ASA. SL NTG caused side effects  He exercises regularly. He had not been having CP or DOE with exercise until the acute event.   No palpitations, no presyncope or syncope.  No LE edema, no orthopnea or PND. He has OSA, no on CPAP because he could not tolerate the mask. Has not tried nasal prongs.  He wonders if he will be able to continue the testosterone supplements.   He has not had a complete physical or labs in years.   Past Medical History:  Diagnosis Date  . Anemia    slight anemia with hepatitis tx  . Arthritis    oa  . Cancer ([place]) 4 months ago   basal cell skin cancer removed from chest area  . GERD (gastroesophageal reflux disease)   . Hepatitis C 04/29/2012   had savalta treatment 2 years ago, now cured  . NSTEMI (non-ST elevated myocardial infarction) ([place]) 03/29/2017  . Palpitations    at times     Past Surgical History:  Procedure Laterality Date  . ARTHROSCOPIC REPAIR ACL Right 15 years ago  . TOTAL HIP ARTHROPLASTY Right  01/11/2015   Procedure: RIGHT TOTAL HIP ARTHROPLASTY ANTERIOR APPROACH;  Surgeon: Paralee Cancel, MD;  Location: [facility]S;  Service: Orthopedics;  Laterality: Right;     Medications Prior to Admission: Prior to Admission medications   Medication Sig Start Date End Date Taking? Authorizing Provider  aspirin 325 MG tablet Take 325 mg by mouth 2 (two) times daily.   Yes [provider]  colchicine 0.6 MG tablet Take 0.6 mg by mouth 2 (two) times daily as needed (gout flare up).  12/14/15  Yes [provider]  testosterone cypionate (DEPOTESTOSTERONE CYPIONATE) 200 MG/ML injection Inject 1 mL into the muscle every 14 (fourteen) days. 03/20/17  Yes [provider]  valACYclovir (VALTREX) 1000 MG tablet Take 1,000 mg by mouth daily as needed (for fever blisters). Reported on 09/22/2015 11/18/14  Yes [provider]     Allergies:   No Known Allergies  Social History:   Social History   Socioeconomic History  . Marital status: Single    Spouse name: Not on file  . Number of children: Not on file  . Years of education: Not on file  . Highest education level: Not on file  Social Needs  . Financial resource strain: Not on file  . Food insecurity - worry: Not on file  .  Food insecurity - inability: Not on file  . Transportation needs - medical: Not on file  . Transportation needs - non-medical: Not on file  Occupational History  . Occupation:  Financial controller: [employer]  Tobacco Use  . Smoking status: Light Tobacco Smoker    Types: Cigars  . Smokeless tobacco: Never Used  . Tobacco comment: cigar  Substance and Sexual Activity  . Alcohol use: No  . Drug use: No  . Sexual activity: Not on file  Other Topics Concern  . Not on file  Social History Narrative  . Not on file    Family History:   The patient's family history includes Alzheimer's disease in his mother; CAD in his father; Heart failure in his father.   Family Status    Relation Name Status  . Mother  Alive  . Father  Alive  . PGM  Deceased  . PGF  Deceased    ROS:  Please see the history of present illness.  All other ROS reviewed and negative.     Physical Exam/Data:   Vitals:   03/29/17 1330 03/29/17 1332 03/29/17 1557 03/29/17 1627  BP:  (!) 141/94 (!) 138/103 106/65  Pulse:  84 90 73  Resp:  18 20 14   Temp:  98 F (36.7 C)    TempSrc:  Oral    SpO2:  97% 96% 92%  Weight: 200 lb (90.7 kg)     Height: 5' 9.5" (1.765 m)      No intake or output data in the 24 hours ending 03/29/17 1725 Filed Weights   03/29/17 1330  Weight: 200 lb (90.7 kg)   Body mass index is 29.11 kg/m.  General:  Well nourished, well developed, in mild acute distress (has had MSO4) HEENT: normal Lymph: no adenopathy Neck: no JVD Endocrine:  No thryomegaly Vascular: No carotid bruits; 4/4 extremity pulses 2+ bilaterally  Cardiac:  normal S1, S2; RRR; no murmur  Lungs:  clear to auscultation bilaterally, no wheezing, rhonchi or rales  Abd: soft, nontender, no hepatomegaly  Ext: no edema Musculoskeletal:  No deformities, BUE and BLE strength normal and equal Skin: warm and dry  Neuro:  CNs 2-12 intact, no focal abnormalities noted Psych:  Normal affect    EKG:  The ECG that was done 01/18 was personally reviewed and demonstrates SR, HR 79, no acute ischemic changes  Relevant CV Studies: none  Laboratory Data:  Chemistry Recent Labs  Lab 03/29/17 1525  NA 140  K 4.0  CL 108  CO2 26  GLUCOSE 92  BUN 26*  CREATININE 1.09  CALCIUM 9.4  GFRNONAA >60  GFRAA >60  ANIONGAP 6     Hematology Recent Labs  Lab 03/29/17 1525  WBC 11.9*  RBC 5.70  HGB 17.3*  HCT 51.2  MCV 89.8  MCH 30.4  MCHC 33.8  RDW 13.4  PLT 241   Cardiac Enzymes  Recent Labs  Lab 03/29/17 1536  TROPIPOC 9.70*     Radiology/Studies:  Dg Chest 2 View  Result Date: 03/29/2017 CLINICAL DATA:  Chest pain EXAM: CHEST  2 VIEW COMPARISON:  None. FINDINGS: Lungs are  clear. Heart size and pulmonary vascularity are normal. No adenopathy. No pneumothorax. No bone lesions. IMPRESSION: No edema or consolidation. Electronically Signed   By: Lowella Grip III M.D.   On: 03/29/2017 14:09    Assessment and Plan:   Principal Problem: 1.  NSTEMI (non-ST elevated myocardial infarction) ([place]) - admit, cath  this pm, further eval based on results - ck lipids, ck A1c and TSH - add low-dose BB, high-dose statin  Active Problems: 2.  Hepatitis C - Per pt, took rx and was cured - follow labs closely  Severity of Illness: The appropriate patient status for this patient is INPATIENT. Inpatient status is judged to be reasonable and necessary in order to provide the required intensity of service to ensure the patient's safety. The patient's presenting symptoms, physical exam findings, and initial radiographic and laboratory data in the context of their chronic comorbidities is felt to place them at high risk for further clinical deterioration. Furthermore, it is not anticipated that the patient will be medically stable for discharge from the hospital within 2 midnights of admission. The following factors support the patient status of inpatient.   " The patient's presenting symptoms include chest pain. " The worrisome physical exam findings include elevated troponin. " The initial radiographic and laboratory data are worrisome because of elevated troponin. " The chronic co-morbidities include FH CAD, tob use.   * I certify that at the point of admission it is my clinical judgment that the patient will require inpatient hospital care spanning beyond 2 midnights from the point of admission due to high intensity of service, high risk for further deterioration and high frequency of surveillance required.*    For questions or updates, please contact [facility] [place] Please consult [email or web address] for contact info under Cardiology/STEMI.    Signed, Rosaria Ferries, PA-C    03/29/2017 5:25 PM  Patient seen and examined and history reviewed. Agree with above findings and plan. 57 yo WM with history of treated Hep C and family history of premature CAD presents for evaluation of prolonged chest pain. Continued chest pain throughout the day today. Troponin now up to 10. Ecg shows some serial changes in the anterior leads without significant ST elevation. Generally very healthy and exercises regularly and eats healthy. Cholesterol status is unknown. No history of DM or HTN. Non smoker.  On exam he is in NAD No JVD or bruits. Lungs clear. CV RRR no gallop or murmur Pulses 2+  Impression: NSTEMI. Some ongoing pain. Recommend urgent cardiac cath +/- PCI. The procedure and risks were reviewed including but not limited to death, myocardial infarction, stroke, arrythmias, bleeding, transfusion, emergency surgery, dye allergy, or renal dysfunction. The patient voices understanding and is agreeable to proceed.[name]  Alek Poncedeleon [place], [place] 03/29/2017 5:29 PM

## 2017-03-29 NOTE — Interval H&P Note (Signed)
History and Physical Interval Note:  03/29/2017 5:33 PM  James [name]  has presented today for surgery, with the diagnosis of cp  The various methods of treatment have been discussed with the patient and family. After consideration of risks, benefits and other options for treatment, the patient has consented to  Procedure(s): LEFT HEART CATH AND CORONARY ANGIOGRAPHY (N/A) as a surgical intervention .  The patient's history has been reviewed, patient examined, no change in status, stable for surgery.  I have reviewed the patient's chart and labs.  Questions were answered to the patient's satisfaction.   Cath Lab Visit (complete for each Cath Lab visit)  Clinical Evaluation Leading to the Procedure:   ACS: Yes.    Non-ACS:    Anginal Classification: CCS IV  Anti-ischemic medical therapy: No Therapy  Non-Invasive Test Results: No non-invasive testing performed  Prior CABG: No previous CABG        Collier [name] [facility] 03/29/2017 5:33 PM

## 2017-03-29 NOTE — Progress Notes (Signed)
ANTICOAGULATION CONSULT NOTE - Follow Up Consult  Pharmacy Consult for Aggrastat x 18 hours Indication: chest pain/ACS  No Known Allergies  Patient Measurements: Height: 5' 9.5" (176.5 cm) Weight: 200 lb (90.7 kg) IBW/kg (Calculated) : 71.85  Vital Signs: Temp: 98 F (36.7 C) (01/18 1332) Temp Source: Oral (01/18 1332) BP: 124/83 (01/18 1856) Pulse Rate: 82 (01/18 1856)  Labs: Recent Labs    03/29/17 1525 03/29/17 1545  HGB 17.3*  --   HCT 51.2  --   PLT 241  --   APTT  --  27  LABPROT  --  12.5  INR  --  0.94  CREATININE 1.09  --     Estimated Creatinine Clearance: 83 mL/min (by C-G formula based on SCr of 1.09 mg/dL).  Assessment: 56yom started on heparin for r/o ACS. He is now s/p cath with successful aspiration thrombectomy and stenting of the mid LAD with DES. Heparin d/c'ed post cath. Pharmacy asked to continue aggrastat x 18 hours. Aggrastat was started ~ 1830.  Goal of Therapy:  Monitor platelets by anticoagulation protocol: Yes   Plan:  1) Continue aggrastat 0.66mcg/kg/min until 1230 tomorrow - stop date in place 2) CBC in AM  Deboraha Sprang 03/29/2017,7:40 PM

## 2017-03-29 NOTE — ED Notes (Signed)
[facility] here to transport patient. Report provided.

## 2017-03-29 NOTE — ED Triage Notes (Signed)
Patient reports that he left [facility] practice this AM with c/o bilateral chest pain, jaws, and ears that started this AM. Patient was given a GI cocktail at the physician's office and an EKG that they said was normal according to the patient. patient states the original chest pain started when he was doing cardiac exercises. Patient states the CP decreased after the GI cocktail, but continues.

## 2017-03-29 NOTE — ED Provider Notes (Signed)
[facility] DEPT Provider Note   CSN: [id number] Arrival date & time: 03/29/17  1306     History   Chief Complaint Chief Complaint  Patient presents with  . Chest Pain    HPI James [name] is a 57 y.o. male.  HPI Patient has no prior cardiac history.  He reports 2 nights ago he had a very large meal and when he went to sleep he got central chest pressure.  He thought it was likely due to all he had eaten so he took a bunch of Tums and eventually it seemed to get better.  He felt okay yesterday.  Today he tried to go to the gym as he usually does and on the elliptical started to get intense chest pain over his whole chest that radiated into his arms.  He reports he got sweaty and short of breath.  Reports he got off the machine and it improved somewhat but he still persists in having central chest pressure.  He went to his doctor's office where they drew a troponin and found it to be elevated.  He was referred directly to the emergency department.  Denies any other shortness of breath at this time or ongoing radiation of pain but still has central pressure quality discomfort.  Patient's family history is positive for father who had multiple stents by the age of 67s.  Patient himself has had low risk factors.  He is very active physically and eats a healthy diet.  He does take testosterone supplementation.  He denies any use of erectile dysfunction medications.  He smokes cigars but no cigarettes.  Reports right up until 2 nights ago he felt great.  He has been very actively lifting weights and doing physical activity. Past Medical History:  Diagnosis Date  . Anemia    slight anemia with hepatitis tx  . Arthritis    oa  . Cancer ([place]) 4 months ago   basal cell skin cancer removed from chest area  . GERD (gastroesophageal reflux disease)   . Hepatitis C 04/29/2012   had savalta treatment 2 years ago, now cured  . Palpitations    at times     Patient  Active Problem List   Diagnosis Date Noted  . Overweight (BMI 25.0-29.9) 01/12/2015  . 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 Hepatitis C 04/29/2012  . Polycythemia 08/22/2011    Past Surgical History:  Procedure Laterality Date  . ARTHROSCOPIC REPAIR ACL Right 15 years ago  . TOTAL HIP ARTHROPLASTY Right 01/11/2015   Procedure: RIGHT TOTAL HIP ARTHROPLASTY ANTERIOR APPROACH;  Surgeon: Paralee Cancel, MD;  Location: [facility];  Service: Orthopedics;  Laterality: Right;       Home Medications    Prior to Admission medications   Medication Sig Start Date End Date Taking? Authorizing Provider  ciclopirox (LOPROX) 0.77 % cream APPLY APPLICATION TO ENDS OF AFFECTED TOENAILS NIGHTLY EXTERNALLY 1 MONTH 12/06/16   [provider]  Colchicine 0.6 MG CAPS TAKE 2 CAPSULES ONE TIME THEN REPEAT WITH 1 CAPSULE 1 HOUR LATER 11/07/15   [provider]  colchicine 0.6 MG tablet  12/14/15   [provider]  [facility] VITAMIN C 500 MG tablet TAKE 1 (ONE) TABLET TWO TIMES DAILY 02/20/16   [provider]  docusate sodium (COLACE) 100 MG capsule TAKE 1 CAPSULE TWO TIMES DAILY 02/20/16   [provider]  HYDROcodone-acetaminophen (NORCO) 10-325 MG tablet Take 1 tablet by mouth 4 (four) times daily. 02/20/16  [provider]  hydrocortisone-pramoxine [facility]) 2.5-1 % rectal cream APPLY IN RECTUM 4 TIMES A DAY APPLY THIN FILM TO AFFECTED AREA 01/15/17   [provider]  indomethacin (INDOCIN) 50 MG capsule Take 50 mg by mouth 3 (three) times daily. 12/05/15   [provider]  ketoconazole (NIZORAL) 2 % cream APPLY TO AFFECTED AREA TWICE A DAY 01/15/17   [provider]  methocarbamol (ROBAXIN) 500 MG tablet TAKE 1 TABLET BY MOUTH EVERY 6 HOURS AS NEEDED FOR SPASMS 02/20/16   [provider]  mupirocin ointment (BACTROBAN) 2 % APPLY TO AFFECTED AREA 3 TIMES A DAY FOR 5 DAYS 12/06/16   [provider]  oxyCODONE-acetaminophen  (PERCOCET) 10-325 MG tablet Take 1 tablet by mouth 4 (four) times daily. 02/20/16   [provider]  predniSONE (DELTASONE) 20 MG tablet TAKE 3 TABLETS BY MOUTH ON DAY 1 THEN 2 TABLETS ON DAYS 2-3 THEN 1 TABLET ON DAYS 4-5 01/15/17   [provider]  TESTIM 50 MG/5GM GEL Place 5 g onto the skin daily. Apply to chest 09/04/11   [provider]  testosterone (TESTIM) 50 MG/5GM (1%) GEL daily. 04/02/12   [provider]  valACYclovir (VALTREX) 1000 MG tablet Take 1,000 mg by mouth daily as needed (for fever blisters). Reported on 09/22/2015 11/18/14   [provider]    Family History Family History  Problem Relation Age of Onset  . Alzheimer's disease Mother   . Heart failure Father     Social History Social History   Tobacco Use  . Smoking status: Light Tobacco Smoker    Types: Cigars  . Smokeless tobacco: Never Used  . Tobacco comment: cigar  Substance Use Topics  . Alcohol use: No  . Drug use: No     Allergies   Patient has no known allergies.   Review of Systems Review of Systems 10 Systems reviewed and are negative for acute change except as noted in the HPI.  Physical Exam Updated Vital Signs BP (!) 141/94 (BP Location: Right Arm)   Pulse 84   Temp 98 F (36.7 C) (Oral)   Resp 18   Ht 5' 9.5" (1.765 m)   Wt 90.7 kg (200 lb)   SpO2 97%   BMI 29.11 kg/m   Physical Exam  Constitutional: He is oriented to person, place, and time. He appears well-developed and well-nourished.  HENT:  Head: Normocephalic and atraumatic.  Eyes: Conjunctivae and EOM are normal.  Neck: Neck supple.  Cardiovascular: Normal rate, regular rhythm, normal heart sounds and intact distal pulses.  No murmur heard. Pulmonary/Chest: Effort normal and breath sounds normal. No respiratory distress.  Abdominal: Soft. There is no tenderness.  Musculoskeletal: Normal range of motion. He exhibits no edema or tenderness.  Neurological: He is alert and  oriented to person, place, and time. No cranial nerve deficit. He exhibits normal muscle tone. Coordination normal.  Skin: Skin is warm and dry.  Psychiatric: He has a normal mood and affect.  Nursing note and vitals reviewed.    ED Treatments / Results  Labs (all labs ordered are listed, but only abnormal results are displayed) Labs Reviewed  I-STAT TROPONIN, ED - Abnormal; Notable for the following components:      Result Value   Troponin i, poc 9.70 (*)    All other components within normal limits  BASIC METABOLIC PANEL  CBC  HEPATIC FUNCTION PANEL  LIPASE, BLOOD  PROTIME-INR    EKG  EKG Interpretation  Date/Time:  Friday March 29 2017 13:28:25 EST Ventricular Rate:  79 PR Interval:    QRS Duration: 90 QT Interval:  354 QTC Calculation: 404 R Axis:   30 Text Interpretation:  Sinus rhythm Consider anterior infarct normal, no change from previous Confirmed by Charlesetta Shanks [phone]) on 03/29/2017 3:32:49 PM       Radiology Dg Chest 2 View  Result Date: 03/29/2017 CLINICAL DATA:  Chest pain EXAM: CHEST  2 VIEW COMPARISON:  None. FINDINGS: Lungs are clear. Heart size and pulmonary vascularity are normal. No adenopathy. No pneumothorax. No bone lesions. IMPRESSION: No edema or consolidation. Electronically Signed   By: Lowella Grip III M.D.   On: 03/29/2017 14:09    Procedures Procedures (including critical care time) CRITICAL CARE Performed by: Si Gaul   Total critical care time: 30  minutes  Critical care time was exclusive of separately billable procedures and treating other patients.  Critical care was necessary to treat or prevent imminent or life-threatening deterioration.  Critical care was time spent personally by me on the following activities: development of treatment plan with patient and/or surrogate as well as nursing, discussions with consultants, evaluation of patient's response to treatment, examination of patient, obtaining history  from patient or surrogate, ordering and performing treatments and interventions, ordering and review of laboratory studies, ordering and review of radiographic studies, pulse oximetry and re-evaluation of patient's condition. Medications Ordered in ED Medications  nitroGLYCERIN 50 mg in dextrose 5 % 250 mL (0.2 mg/mL) infusion (not administered)     Initial Impression / Assessment and Plan / ED Course  I have reviewed the triage vital signs and the nursing notes.  Pertinent labs & imaging results that were available during my care of the patient were reviewed by me and considered in my medical decision making (see chart for details).     Consult: Cardiology (15: 58) Trish made aware of N STEMI with active chest pain will consult. [facility] call back and advised they have arranged for transport to take the patient to come in for cardiac catheterization.  Final Clinical Impressions(s) / ED Diagnoses   Final diagnoses:  NSTEMI (non-ST elevated myocardial infarction) [facility])   Patient presents as outlined above.  He is having N STEMI.  EKG at this time does not show ST elevation.  He has severe chest pain this morning but now has some ongoing chest pressure that is not resolved completely.  Will initiate heparin and nitroglycerin in the emergency department with emergent transfer for interventional cardiology.  Patient is alert and appropriate.  He is not in acute distress.  No respiratory distress.  Vital signs stable.  Clinically well appearance with otherwise active and physically healthy individual. ED Discharge Orders    None       Charlesetta Shanks, MD 03/29/17 1625

## 2017-03-30 ENCOUNTER — Inpatient Hospital Stay (HOSPITAL_COMMUNITY): Payer: BLUE CROSS/BLUE SHIELD

## 2017-03-30 DIAGNOSIS — I361 Nonrheumatic tricuspid (valve) insufficiency: Secondary | ICD-10-CM

## 2017-03-30 LAB — BASIC METABOLIC PANEL
Anion gap: 11 (ref 5–15)
BUN: 20 mg/dL (ref 6–20)
CO2: 19 mmol/L — ABNORMAL LOW (ref 22–32)
Calcium: 8.5 mg/dL — ABNORMAL LOW (ref 8.9–10.3)
Chloride: 107 mmol/L (ref 101–111)
Creatinine, Ser: 1.03 mg/dL (ref 0.61–1.24)
GFR calc Af Amer: >60 mL/min (ref >60)
GFR calc non Af Amer: >60 mL/min (ref >60)
Glucose, Bld: 118 mg/dL — ABNORMAL HIGH (ref 65–99)
Potassium: 3.3 mmol/L — ABNORMAL LOW (ref 3.5–5.1)
Sodium: 137 mmol/L (ref 135–145)

## 2017-03-30 LAB — ECHOCARDIOGRAM COMPLETE
Ao-asc: 35 cm
E decel time: 261 msec
E/e' ratio: 5.06
FS: 30 % (ref 28–44)
Height: 69.5 in
IVS/LV PW RATIO, ED: 1
LA ID, A-P, ES: 33 mm
LA diam end sys: 33 mm
LA diam index: 1.59 cm/m2
LA vol A4C: 37.7 ml
LA vol index: 19.3 mL/m2
LA vol: 40.2 mL
LV E/e' medial: 5.06
LV E/e'average: 5.06
LV PW d: 12 mm — AB (ref 0.6–1.1)
LV dias vol index: 52 mL/m2
LV dias vol: 108 mL (ref 62–150)
LV e' LATERAL: 11.5 cm/s
LV sys vol index: 30 mL/m2
LV sys vol: 63 mL — AB (ref 21–61)
LVOT SV: 62 mL
LVOT VTI: 17.8 cm
LVOT area: 3.46 cm2
LVOT diameter: 21 mm
LVOT peak vel: 91.6 cm/s
Lateral S' vel: 13.1 cm/s
MV Dec: 261
MV pk A vel: 69.8 m/s
MV pk E vel: 58.2 m/s
Simpson's disk: 42
Stroke v: 45 ml
TAPSE: 18.9 mm
TDI e' lateral: 11.5
TDI e' medial: 8.49
Weight: 3200 oz

## 2017-03-30 LAB — LIPID PANEL
Cholesterol: 155 mg/dL (ref 0–200)
HDL: 26 mg/dL — ABNORMAL LOW (ref >40)
LDL Cholesterol: 91 mg/dL (ref 0–99)
Total CHOL/HDL Ratio: 6 RATIO
Triglycerides: 191 mg/dL — ABNORMAL HIGH (ref <150)
VLDL: 38 mg/dL (ref 0–40)

## 2017-03-30 LAB — CBC
HCT: 45.6 % (ref 39.0–52.0)
Hemoglobin: 15.2 g/dL (ref 13.0–17.0)
MCH: 30.3 pg (ref 26.0–34.0)
MCHC: 33.3 g/dL (ref 30.0–36.0)
MCV: 90.8 fL (ref 78.0–100.0)
Platelets: 215 10*3/uL (ref 150–400)
RBC: 5.02 MIL/uL (ref 4.22–5.81)
RDW: 13.7 % (ref 11.5–15.5)
WBC: 8.7 10*3/uL (ref 4.0–10.5)

## 2017-03-30 LAB — TROPONIN I
Troponin I: 45.54 ng/mL (ref <0.03)
Troponin I: 31.48 ng/mL (ref <0.03)

## 2017-03-30 MED ORDER — ALPRAZOLAM 0.25 MG PO TABS
0.2500 mg | ORAL_TABLET | Freq: Two times a day (BID) | ORAL | Status: DC | PRN
  Administered 2017-03-30 – 2017-04-01 (×5): 0.25 mg via ORAL
  Filled 2017-03-30 (×5): qty 1

## 2017-03-30 NOTE — Progress Notes (Signed)
  Echocardiogram 2D Echocardiogram has been performed.  James [name] 03/30/2017, 4:17 PM

## 2017-03-30 NOTE — Progress Notes (Signed)
CARDIAC REHAB PHASE I   PRE:  Rate/Rhythm: 65 SR    BP: sitting 107/85    SaO2:   MODE:  Ambulation: 370 ft   POST:  Rate/Rhythm: 78 SR    BP: sitting 119/87     SaO2: 98 RA  Tolerated well, no c/o. VSS. Ed completed with pt and fiance. Good reception, numerous questions. He is quite "revved up" in general, Type A. We discussed taking it easy and managing stress. He understands Brilinta/ASA, is reluctant to take meds however and has many questions UO:HFGBMS. He is agreeable right now but will discuss with MD. Will refer to [facility] in [place]. He is eager to get back to the gym. Unfortunately we are full until end of February right now. Encouraged more walking.  [phone]   [place], ACSM 03/30/2017 12:45 PM

## 2017-03-30 NOTE — Progress Notes (Signed)
Advanced Heart Failure Rounding Note   Subjective:     Cath yesterday with 100% LAD lesion and EF 45% underwent successful aspiration thrombectomy and stenting.   No further CP. Numerous questions about his prognosis.    Objective:   Weight Range:  Vital Signs:   Temp:  [98 F (36.7 C)-98.1 F (36.7 C)] 98 F (36.7 C) (01/19 0812) Pulse Rate:  [64-96] 75 (01/19 0812) Resp:  [4-62] 16 (01/19 1100) BP: (91-139)/(56-103) 118/83 (01/19 0812) SpO2:  [87 %-98 %] 95 % (01/19 0812) Last BM Date: 03/29/17  Weight change: Filed Weights   03/29/17 1330  Weight: 90.7 kg (200 lb)    Intake/Output:   Intake/Output Summary (Last 24 hours) at 03/30/2017 1403 Last data filed at 03/30/2017 1200 Gross per 24 hour  Intake 1320.24 ml  Output 1175 ml  Net 145.24 ml     Physical Exam: General:  Well appearing. Muscular No resp difficulty HEENT: normal Neck: supple. JVP 6. Carotids 2+ bilat; no bruits. No lymphadenopathy or thryomegaly appreciated. Cor: PMI nondisplaced. Regular rate & rhythm. No rubs, gallops or murmurs. Lungs: clear Abdomen: soft, nontender, nondistended. No hepatosplenomegaly. No bruits or masses. Good bowel sounds. Extremities: no cyanosis, clubbing, rash, edema Neuro: alert & orientedx3, cranial nerves grossly intact. moves all 4 extremities w/o difficulty. Affect pleasant  Telemetry: NSR 76 Personally reviewed   Labs: Basic Metabolic Panel: Recent Labs  Lab 03/29/17 1525 03/29/17 2029 03/30/17 0251  NA 140  --  137  K 4.0  --  3.3*  CL 108  --  107  CO2 26  --  19*  GLUCOSE 92  --  118*  BUN 26*  --  20  CREATININE 1.09 1.01 1.03  CALCIUM 9.4  --  8.5*    Liver Function Tests: Recent Labs  Lab 03/29/17 1545 03/29/17 2029  AST 97* 166*  ALT 34 36  ALKPHOS 60 54  BILITOT 0.4 0.9  PROT 7.3 6.3*  ALBUMIN 4.2 3.8   Recent Labs  Lab 03/29/17 1545  LIPASE 43   No results for input(s): AMMONIA in the last 168 hours.  CBC: Recent  Labs  Lab 03/29/17 1525 03/29/17 2029 03/30/17 0251  WBC 11.9* 11.7* 8.7  HGB 17.3* 15.9 15.2  HCT 51.2 47.0 45.6  MCV 89.8 90.2 90.8  PLT 241 223 215    Cardiac Enzymes: Recent Labs  Lab 03/29/17 2029 03/30/17 0251 03/30/17 0758  TROPONINI 32.80* 45.54* 31.48*    BNP: BNP (last 3 results) No results for input(s): BNP in the last 8760 hours.  ProBNP (last 3 results) No results for input(s): PROBNP in the last 8760 hours.    Other results:  Imaging: Dg Chest 2 View  Result Date: 03/29/2017 CLINICAL DATA:  Chest pain EXAM: CHEST  2 VIEW COMPARISON:  None. FINDINGS: Lungs are clear. Heart size and pulmonary vascularity are normal. No adenopathy. No pneumothorax. No bone lesions. IMPRESSION: No edema or consolidation. Electronically Signed   By: Lowella Grip III M.D.   On: 03/29/2017 14:09      Medications:     Scheduled Medications: . aspirin  81 mg Oral Daily  . atorvastatin  80 mg Oral q1800  . enoxaparin (LOVENOX) injection  40 mg Subcutaneous Q24H  . metoprolol succinate  25 mg Oral Daily  . sodium chloride flush  3 mL Intravenous Q12H  . ticagrelor  90 mg Oral BID     Infusions: . sodium chloride 250 mL (03/30/17 1200)  PRN Medications:  sodium chloride, acetaminophen, ondansetron (ZOFRAN) IV, sodium chloride flush   Assessment:   57 y/o male with treated HCV admitted with NSTEMI. Found tyo have 100% LAD occlusion. S/p PCI/DES to LAD on 1/18  Plan/Discussion:    1. NSTEMI - cath 100% LAD lesion and EF 45% underwent successful aspiration thrombectomy and stenting. - peak trop 45.5 no further CP  - treat with ASA, Brilinta and statin - CR to see - can transfer to tele - home in am   2. HCV - s/p therapy  3. Hypokalemia - will supp  4. Polycythemia - he has been using testosterone supplements and has been having therapuetic phlebotomy with Dr. Earlie Server - I have strongly suggested he stop give relationship with testosterone  supp and CV disese - consider sleep study as well  Total time spent 45 minutes. Over half that time spent discussing above.    Length of Stay: 1   Glori Bickers MD 03/30/2017, 2:03 PM  Advanced Heart Failure Team Pager [phone] (M-F; 7a - 4p)  Please contact [facility] for night-coverage after hours (4p -7a ) and weekends on [email or web address]

## 2017-03-31 DIAGNOSIS — B182 Chronic viral hepatitis C: Secondary | ICD-10-CM

## 2017-03-31 LAB — BASIC METABOLIC PANEL
Anion gap: 8 (ref 5–15)
BUN: 16 mg/dL (ref 6–20)
CO2: 22 mmol/L (ref 22–32)
Calcium: 8.8 mg/dL — ABNORMAL LOW (ref 8.9–10.3)
Chloride: 109 mmol/L (ref 101–111)
Creatinine, Ser: 1.13 mg/dL (ref 0.61–1.24)
GFR calc Af Amer: >60 mL/min (ref >60)
GFR calc non Af Amer: >60 mL/min (ref >60)
Glucose, Bld: 102 mg/dL — ABNORMAL HIGH (ref 65–99)
Potassium: 3.9 mmol/L (ref 3.5–5.1)
Sodium: 139 mmol/L (ref 135–145)

## 2017-03-31 LAB — MAGNESIUM: Magnesium: 2 mg/dL (ref 1.7–2.4)

## 2017-03-31 LAB — TROPONIN I: Troponin I: 7.9 ng/mL (ref <0.03)

## 2017-03-31 MED ORDER — METOPROLOL SUCCINATE ER 25 MG PO TB24: 25.0000 mg | ORAL_TABLET | Freq: Every day | ORAL | 6 refills | Status: DC

## 2017-03-31 MED ORDER — POTASSIUM CHLORIDE CRYS ER 20 MEQ PO TBCR
40.0000 meq | EXTENDED_RELEASE_TABLET | Freq: Once | ORAL | Status: AC
  Administered 2017-03-31: 40 meq via ORAL
  Filled 2017-03-31: qty 2

## 2017-03-31 MED ORDER — ASPIRIN 81 MG PO TABS: 81.0000 mg | ORAL_TABLET | Freq: Every day | ORAL | 3 refills | Status: DC

## 2017-03-31 MED ORDER — NITROGLYCERIN 0.4 MG SL SUBL: 0.4000 mg | SUBLINGUAL_TABLET | SUBLINGUAL | 12 refills | Status: DC | PRN

## 2017-03-31 MED ORDER — TICAGRELOR 90 MG PO TABS: 90.0000 mg | ORAL_TABLET | Freq: Two times a day (BID) | ORAL | 11 refills | Status: DC

## 2017-03-31 MED ORDER — ISOSORBIDE MONONITRATE ER 30 MG PO TB24: 30.0000 mg | ORAL_TABLET | Freq: Every day | ORAL | 6 refills | Status: DC

## 2017-03-31 MED ORDER — POTASSIUM CHLORIDE CRYS ER 10 MEQ PO TBCR
10.0000 meq | EXTENDED_RELEASE_TABLET | Freq: Once | ORAL | Status: AC
  Administered 2017-03-31: 10 meq via ORAL
  Filled 2017-03-31: qty 1

## 2017-03-31 MED ORDER — ISOSORBIDE MONONITRATE ER 30 MG PO TB24
30.0000 mg | ORAL_TABLET | Freq: Every day | ORAL | Status: DC
  Administered 2017-03-31 – 2017-04-01 (×2): 30 mg via ORAL
  Filled 2017-03-31 (×2): qty 1

## 2017-03-31 MED ORDER — LISINOPRIL 2.5 MG PO TABS: 2.5000 mg | ORAL_TABLET | Freq: Every day | ORAL | 6 refills | Status: DC

## 2017-03-31 MED ORDER — ATORVASTATIN CALCIUM 80 MG PO TABS: 80.0000 mg | ORAL_TABLET | Freq: Every day | ORAL | 6 refills | Status: DC

## 2017-03-31 NOTE — Care Management Note (Addendum)
Case Management Note  Patient Details  Name: Behr Cislo MRN: [id number] Date of Birth: 1960-12-14  Subjective/Objective:    Pt presented for CP and is newly prescribed Brilinta.  Pt works and has [employer] but states it does not cover prescriptions well.   Action/Plan: Pt's pharmacy contacted and copay for [place] will be $75.  Pt states this is affordable for him at this time.  Pt given copay card to reduce copay.  Pt verbalizes understanding to ask MD for samples or call company for assistance as needed.           Expected Discharge Date:  03/31/17               Expected Discharge Plan:  Home/Self Care  In-House Referral:  NA  Discharge planning Services  CM Consult, Medication Assistance  Post Acute Care Choice:  NA Choice offered to:  NA  DME Arranged:    DME Agency:     HH Arranged:    HH Agency:     Status of Service:     If discussed at [employer], dates discussed:    Additional Comments:  Arley Phenix, RN 03/31/2017, 2:08 PM

## 2017-03-31 NOTE — Discharge Summary (Signed)
Discharge Summary    Patient ID: James [name],  MRN: [id number], DOB/AGE: May 01, 1960 57 y.o.  Admit date: 03/29/2017 Discharge date: 03/31/2017  Primary Care Provider: System, Pcp Not In Primary Cardiologist: [name]   Discharge Diagnoses    Principal Problem:   NSTEMI (non-ST elevated myocardial infarction) [facility]) Active Problems:   Hepatitis C   NSVT   Hypokalemia   HLD  Acute systolic CHF  Allergies No Known Allergies  Diagnostic Studies/Procedures    Echo 03/30/17 Study Conclusions  - Left ventricle: Septal and apical akinesis inferior wall   hypokinesis The cavity size was mildly dilated. Wall thickness   was increased in a pattern of mild LVH. Systolic function was   mildly to moderately reduced. The estimated ejection fraction was   in the range of 40% to 45%. Left ventricular diastolic function   parameters were normal. - Atrial septum: No defect or patent foramen ovale was identified.  Coronary Thrombectomy  CORONARY STENT INTERVENTION  LEFT HEART CATH AND CORONARY ANGIOGRAPHY  Conclusion     Prox LAD lesion is 100% stenosed.  Post intervention, there is a 0% residual stenosis.  A drug-eluting stent was successfully placed using a STENT SYNERGY DES 3.5X16.  The left ventricular systolic function is normal.  LV end diastolic pressure is normal.  The left ventricular ejection fraction is 45-50% by visual estimate.   1. Single vessel occlusive CAD- 100% mid LAD with collaterals 2. Mild LV dysfunction. EF 45% 3. Normal LVEDP 4. Successful aspiration thrombectomy and stenting of the mid LAD with DES   Plan: DAPT with ASA and Brilinta. IV Aggrastat for 18 hours. May be a candidate for DC Sunday if no complications. High dose statin.      History of Present Illness     56  yo WM with history of treated Hep C and family history of premature CAD presents for evaluation of prolonged chest pain. Continued chest pain throughout the day today. He  had additional pain with exertion. The sx finally worsened to the point that he sought medical help. His PCP did a troponin and then told him to come to the ER.   He is still having CP at a 2/10 in ER. His is on IV Nitro, IV NS. He exercises regularly. He had not been having CP or DOE with exercise until the acute event.   EKG demonstrates SR, HR 79, no acute ischemic changes   Hospital Course     Consultants: None  1. NSTEMI - The patient wad admitted and started on IV heparin and IV nitro. Cath with 100% LAD lesion and EF 45% underwent successful aspiration thrombectomy and stenting. Peak trop 45.5. No further CP. Ambulated well. Echo showed LVEF of 40-45%. Euvolemic.  - Continue ASA, Brillinta, statin. BB and ACE.   2. Hypokalemia/NSVT - His K was 3.3 and noted some NSVT. Asymptomatic. Treated with KDur 60meq x 1 prior to discharge.   3. Polycythemia - he has been using testosterone supplements and has been having therapuetic phlebotomy with Dr. Earlie Server - Dr. Haroldine Laws has strongly suggested he stop give relationship with testosterone supp and CV disese - consider sleep study as well  4. Hyperlipidemia - 03/30/2017: Cholesterol 155; HDL 26; LDL Cholesterol 91; Triglycerides 191; VLDL 38 - Continue statin.  - Consider OP f/u labs 6-8 weeks given statin initiation this admission.  5. Acute systolic CH - Echo showed LVEF of 40-45%. Euvolemic.  - Continue BB and ACE. Advised to limit salt.  Hopefully LVEF will improve with revascularization and medications. Repeat echo in few months.    The patient complained of Left sided chest pain with L arm pain 7/10 after morning rounds. Somewhat different from initial presentation. Right before discharge, EKG showed evolving ST changes anteriorly and TWI in lateral leads. New compared to prior EKG. Patient is very anxious about his health condition. Will get stat troponin to see trend. Start Imdur 30mg  qd. He wants to go home. Will observe him  for few more hours. Might be due to anxiety however concerning. He can go home later today if no recurrent symptoms otherwise keep overnight for observation for one more day. Reviewed with Dr. Rayann Heman. He will benefits from long term anxiety medications. F/u with PCP.    The patient has been seen by Dr. Rayann Heman today and deemed ready for discharge home. All follow-up appointments have been scheduled. Discharge medications are listed below.    Discharge Vitals Blood pressure 117/78, pulse 73, temperature 98.3 F (36.8 C), resp. rate 18, height 5' 9.5" (1.765 m), weight 201 lb 9.6 oz (91.4 kg), SpO2 97 %.  Filed Weights   03/29/17 1330 03/31/17 0407  Weight: 200 lb (90.7 kg) 201 lb 9.6 oz (91.4 kg)    Physical Exam  Constitutional: He appears well-developed and well-nourished. No distress.  HENT:  Head: Normocephalic and atraumatic.  Eyes: EOM are normal. Pupils are equal, round, and reactive to light.  Neck: Normal range of motion. Neck supple.  Cardiovascular: Normal rate and regular rhythm.  No murmur heard. R radial cath site without hematoma  Pulmonary/Chest: Effort normal and breath sounds normal. He has no wheezes. He has no rales.  Abdominal: Soft. Bowel sounds are normal. He exhibits no distension. There is no tenderness.  Musculoskeletal: Normal range of motion. He exhibits no edema or deformity.  Neurological: He is alert.  Skin: Skin is warm and dry. He is not diaphoretic.  Psychiatric: He has a normal mood and affect.    Labs & Radiologic Studies     CBC Recent Labs    03/29/17 2029 03/30/17 0251  WBC 11.7* 8.7  HGB 15.9 15.2  HCT 47.0 45.6  MCV 90.2 90.8  PLT 223 151   Basic Metabolic Panel Recent Labs    03/29/17 1525 03/29/17 2029 03/30/17 0251  NA 140  --  137  K 4.0  --  3.3*  CL 108  --  107  CO2 26  --  19*  GLUCOSE 92  --  118*  BUN 26*  --  20  CREATININE 1.09 1.01 1.03  CALCIUM 9.4  --  8.5*   Liver Function Tests Recent Labs     03/29/17 1545 03/29/17 2029  AST 97* 166*  ALT 34 36  ALKPHOS 60 54  BILITOT 0.4 0.9  PROT 7.3 6.3*  ALBUMIN 4.2 3.8   Recent Labs    03/29/17 1545  LIPASE 43   Cardiac Enzymes Recent Labs    03/29/17 2029 03/30/17 0251 03/30/17 0758  TROPONINI 32.80* 45.54* 31.48*   Hemoglobin A1C Recent Labs    03/29/17 2029  HGBA1C 5.5   Fasting Lipid Panel Recent Labs    03/30/17 0251  CHOL 155  HDL 26*  LDLCALC 91  TRIG 191*  CHOLHDL 6.0   Dg Chest 2 View  Result Date: 03/29/2017 CLINICAL DATA:  Chest pain EXAM: CHEST  2 VIEW COMPARISON:  None. FINDINGS: Lungs are clear. Heart size and pulmonary vascularity are normal. No adenopathy. No pneumothorax.  No bone lesions. IMPRESSION: No edema or consolidation. Electronically Signed   By: Lowella Grip III M.D.   On: 03/29/2017 14:09    Disposition   Pt is being discharged home today in good condition.  Follow-up Plans & Appointments    Follow-up Information    Martinique, Peter M, MD Follow up.   Specialty:  Cardiology Why:  office will call with time and date. Please call Tuesday if you did not heard by then Contact information: [address] [place] [address] [phone]          Discharge Instructions    Amb Referral to Cardiac Rehabilitation   Complete by:  As directed    Diagnosis:   Coronary Stents PTCA NSTEMI     Diet - low sodium heart healthy   Complete by:  As directed    Discharge instructions   Complete by:  As directed    NO HEAVY LIFTING (>10lbs) X 2 WEEKS. NO SEXUAL ACTIVITY X 2 WEEKS. NO DRIVING X 1 WEEK. NO SOAKING BATHS, HOT TUBS, POOLS, ETC., X 7 DAYS.   Increase activity slowly   Complete by:  As directed       Discharge Medications   Allergies as of 03/31/2017   No Known Allergies     Medication List    TAKE these medications   aspirin 81 MG tablet Take 1 tablet (81 mg total) by mouth daily. What changed:    medication strength  how much to  take  when to take this   atorvastatin 80 MG tablet Commonly known as:  LIPITOR Take 1 tablet (80 mg total) by mouth daily at 6 PM.   colchicine 0.6 MG tablet Take 0.6 mg by mouth 2 (two) times daily as needed (gout flare up).   isosorbide mononitrate 30 MG 24 hr tablet Commonly known as:  IMDUR Take 1 tablet (30 mg total) by mouth daily.   lisinopril 2.5 MG tablet Commonly known as:  PRINIVIL,ZESTRIL Take 1 tablet (2.5 mg total) by mouth daily.   metoprolol succinate 25 MG 24 hr tablet Commonly known as:  TOPROL-XL Take 1 tablet (25 mg total) by mouth daily.   nitroGLYCERIN 0.4 MG SL tablet Commonly known as:  NITROSTAT Place 1 tablet (0.4 mg total) under the tongue every 5 (five) minutes as needed.   testosterone cypionate 200 MG/ML injection Commonly known as:  DEPOTESTOSTERONE CYPIONATE Inject 1 mL into the muscle every 14 (fourteen) days.   ticagrelor 90 MG Tabs tablet Commonly known as:  BRILINTA Take 1 tablet (90 mg total) by mouth 2 (two) times daily.   valACYclovir 1000 MG tablet Commonly known as:  VALTREX Take 1,000 mg by mouth daily as needed (for fever blisters). Reported on 09/22/2015        Aspirin prescribed at discharge?  Yes High Intensity Statin Prescribed? (Lipitor 40-80mg  or Crestor 20-40mg ): Yes Beta Blocker Prescribed? Yes For EF 45% or less, Was ACEI/ARB Prescribed? Yes ADP Receptor Inhibitor Prescribed? (i.e. Plavix etc.-Includes Medically Managed Patients): Yes For EF <40%, Aldosterone Inhibitor Prescribed? N/A Was EF assessed during THIS hospitalization? Yes Was Cardiac Rehab II ordered? (Included Medically managed Patients): Yes   Outstanding Labs/Studies   Consider OP f/u labs 6-8 weeks given statin initiation this admission. BMET during follow up  Duration of Discharge Encounter   Greater than 30 minutes including physician time.  Signed, Crista Luria Bhagat PA-C 03/31/2017, 11:41 AM   Pt with subsequent chest pain, now  resolved.  Resolving ekg changes.  Will observe another day.  Thompson [name] MD, [facility]

## 2017-03-31 NOTE — Progress Notes (Addendum)
While reviewing the discharge instructions, Pt stated that he has been feeling chest pain for about an hour 3/10, but he thinks its just anxiety about going home. VS are WNL, EKG obtained and cardiology team made aware. Will continue to monitor.  Ferdinand Lango, RN

## 2017-03-31 NOTE — Progress Notes (Signed)
Discussed EKG with Dr. Rayann Heman - continued anterior ST changes with slightly prolonged QT. This EKG fits with what would be expected for recent MI matching with his troponins however remains more abnormal than his admitting tracings. Pt reports he's feeling good this afternoon. He is the self-admitted anxious type, citing he's had chest pain for many many years in setting of stress and now is concerned about sorting out stress CP and cardiac CP. He's pain free this PM. K was 3.3 this AM -> repleted earlier. Given QT, will repeat BMET and MG now and replete if needed. Pt agrees with plan. Repeat troponin and EKG in AM. Will cancel DC.  Axavier Pressley PA-C

## 2017-03-31 NOTE — Discharge Instructions (Addendum)
Coronary Angiogram With Stent, Care After °This sheet gives you information about how to care for yourself after your procedure. Your health care provider may also give you more specific instructions. If you have problems or questions, contact your health care provider. °What can I expect after the procedure? °After your procedure, it is common to have: °· Bruising in the area where a small, thin tube (catheter) was inserted. This usually fades within 1-2 weeks. °· Blood collecting in the tissue (hematoma) that may be painful to the touch. It should usually decrease in size and tenderness within 1-2 weeks. ° °Follow these instructions at home: °Insertion area care °· Do not take baths, swim, or use a hot tub until your health care provider approves. °· You may shower 24-48 hours after the procedure or as directed by your health care provider. °· Follow instructions from your health care provider about how to take care of your incision. Make sure you: °? Wash your hands with soap and water before you change your bandage (dressing). If soap and water are not available, use hand sanitizer. °? Change your dressing as told by your health care provider. °? Leave stitches (sutures), skin glue, or adhesive strips in place. These skin closures may need to stay in place for 2 weeks or longer. If adhesive strip edges start to loosen and curl up, you may trim the loose edges. Do not remove adhesive strips completely unless your health care provider tells you to do that. °· Remove the bandage (dressing) and gently wash the catheter insertion site with plain soap and water. °· Pat the area dry with a clean towel. Do not rub the area, because that may cause bleeding. °· Do not apply powder or lotion to the incision area. °· Check your incision area every day for signs of infection. Check for: °? More redness, swelling, or pain. °? More fluid or blood. °? Warmth. °? Pus or a bad smell. °Activity °· Do not drive for 24 hours if you  were given a medicine to help you relax (sedative). °· Do not lift anything that is heavier than 10 lb (4.5 kg) for 5 days after your procedure or as directed by your health care provider. °· Ask your health care provider when it is okay for you: °? To return to work or school. °? To resume usual physical activities or sports. °? To resume sexual activity. °Eating and drinking °· Eat a heart-healthy diet. This should include plenty of fresh fruits and vegetables. °· Avoid the following types of food: °? Food that is high in salt. °? Canned or highly processed food. °? Food that is high in saturated fat or sugar. °? Fried food. °· Limit alcohol intake to no more than 1 drink a day for non-pregnant women and 2 drinks a day for men. One drink equals 12 oz of beer, 5 oz of wine, or 1½ oz of hard liquor. °Lifestyle °· Do not use any products that contain nicotine or tobacco, such as cigarettes and e-cigarettes. If you need help quitting, ask your health care provider. °· Take steps to manage and control your weight. °· Get regular exercise. °· Manage your blood pressure. °· Manage other health problems, such as diabetes. °General instructions °· Take over-the-counter and prescription medicines only as told by your health care provider. Blood thinners may be prescribed after your procedure to improve blood flow through the stent. °· If you need an MRI after your heart stent has been placed, be   sure to tell the health care provider who orders the MRI that you have a heart stent.  Keep all follow-up visits as directed by your health care provider. This is important. Contact a health care provider if:  You have a fever.  You have chills.  You have increased bleeding from the catheter insertion area. Hold pressure on the area. Get help right away if:  You develop chest pain or shortness of breath.  You feel faint or you pass out.  You have unusual pain at the catheter insertion area.  You have redness,  warmth, or swelling at the catheter insertion area.  You have drainage (other than a small amount of blood on the dressing) from the catheter insertion area.  The catheter insertion area is bleeding, and the bleeding does not stop after 30 minutes of holding steady pressure on the area.  You develop bleeding from any other place, such as from your rectum. There may be bright red blood in your urine or stool, or it may appear as black, tarry stool. This information is not intended to replace advice given to you by your health care provider. Make sure you discuss any questions you have with your health care provider. Document Released: 09/15/2004 Document Revised: 11/24/2015 Document Reviewed: 11/24/2015 Elsevier Interactive Patient Education  2018 [employer]. Ticagrelor oral tablet What is this medicine? TICAGRELOR (TYE ka GREL or) helps to prevent blood clots. This medicine is used to prevent heart attack, stroke, or other vascular events in people who have had a recent heart attack or who have severe chest pain. This medicine may be used for other purposes; ask your health care provider or pharmacist if you have questions. COMMON BRAND NAME(S): BRILINTA What should I tell my health care provider before I take this medicine? They need to know if you have any of these conditions: -bleeding disorders -bleeding in the brain -having surgery -history of irregular heartbeat -history of stomach bleeding -liver disease -an unusual or allergic reaction to ticagrelor, other medicines, foods, dyes, or preservatives -pregnant or trying to get pregnant -breast-feeding How should I use this medicine? Take this medicine by mouth with a glass of water. Follow the directions on the prescription label. You can take it with or without food. If it upsets your stomach, take it with food. Take your medicine at regular intervals. Do not take it more often than directed. Do not stop taking except on your  doctor's advice. Talk to you pediatrician regarding the use of this medicine in children. Special care may be needed. Overdosage: If you think you have taken too much of this medicine contact a poison control center or emergency room at once. NOTE: This medicine is only for you. Do not share this medicine with others. What if I miss a dose? If you miss a dose, take it as soon as you can. If it is almost time for your next dose, take only that dose. Do not take double or extra doses. What may interact with this medicine? -certain antibiotics like clarithromycin and telithromycin -certain medicines for fungal infections like itraconazole, ketoconazole, and voriconazole -certain medicines for HIV infection like atazanavir, indinavir, nelfinavir, ritonavir, and saquinavir -certain medicines for seizures like carbamazepine, phenobarbital, and phenytoin -certain medicines that treat or prevent blood clots like warfarin -dexamethasone -digoxin -lovastatin -nefazodone -rifampin -simvastatin This list may not describe all possible interactions. Give your health care provider a list of all the medicines, herbs, non-prescription drugs, or dietary supplements you use. Also tell  them if you smoke, drink alcohol, or use illegal drugs. Some items may interact with your medicine. What should I watch for while using this medicine? Visit your doctor or health care professional for regular check ups. Do not stop taking you medicine unless your doctor tells you to. Notify your doctor or health care professional and seek emergency treatment if you develop breathing problems; changes in vision; chest pain; severe, sudden headache; pain, swelling, warmth in the leg; trouble speaking; sudden numbness or weakness of the face, arm, or leg. These can be signs that your condition has gotten worse. If you are going to have surgery or dental work, tell your doctor or health care professional that you are taking this  medicine. You should take aspirin every day with this medicine. Do not take more than 100 mg each day. Talk to your doctor if you have questions. What side effects may I notice from receiving this medicine? Side effects that you should report to your doctor or health care professional as soon as possible: -allergic reactions like skin rash, itching or hives, swelling of the face, lips, or tongue -breathing problems -fast or irregular heartbeat -feeling faint or light-headed, falls -signs and symptoms of bleeding such as bloody or black, tarry stools; red or dark-brown urine; spitting up blood or brown material that looks like coffee grounds; red spots on the skin; unusual bruising or bleeding from the eye, gums, or nose Side effects that usually do not require medical attention (report to your doctor or health care professional if they continue or are bothersome): -breast enlargement in both males and females -diarrhea -dizziness -headache -tiredness -upset stomach This list may not describe all possible side effects. Call your doctor for medical advice about side effects. You may report side effects to FDA at 1-800-FDA-1088. Where should I keep my medicine? Keep out of the reach of children. Store at room temperature of 59 to 86 degrees F (15 to 30 degrees C). Throw away any unused medicine after the expiration date. NOTE: This sheet is a summary. It may not cover all possible information. If you have questions about this medicine, talk to your doctor, pharmacist, or health care provider.  2018 Elsevier/Gold Standard (2015-03-31 11:53:14) Heart Attack A heart attack (myocardial infarction, MI) is a condition that occurs when an artery in the heart (coronary artery) becomes narrowed or blocked. The narrowing or blockage cuts off the blood and oxygen supply to the heart, which permanently damages the heart. When one or more of your coronary arteries becomes blocked, that area of your heart  begins to die. This causes symptoms felt during a heart attack. A heart attack is a medical emergency. If you think you are having a heart attack, do not wait to see if the symptoms will go away. Get medical help right away. What are the causes? Many conditions can cause a heart attack, including:  Atherosclerosis. This is when a fatty substance (plaque) gradually builds up in the arteries. This buildup can block or reduce blood supply to one or more of the coronary arteries. This is the most common cause of heart attack.  A blood clot. A blood clot can develop suddenly when plaque breaks up (ruptures) within a coronary artery. A blood clot can block the artery, which prevents blood flow to the heart.  Low blood pressure (hypotension).  An abnormal heart beat (arrhythmia).  Severe tightening (spasm) of a coronary artery. This cuts off blood flow through the artery.  Tearing of a  coronary artery (spontaneous coronary artery dissection).  What increases the risk? The following factors may make you more likely to develop this condition:  Age. The older a person is the higher the risk.  A history of heart attack or stroke.  Being male.  A family history of chest pain, heart disease, or stroke.  Smoking.  Lack of exercise.  Being overweight or obese.  High blood pressure (hypertension).  High cholesterol.  Diabetes.  Stress.  Drinking too much alcohol.  Using drugs, such as cocaine or methamphetamine.  What are the signs or symptoms? Symptoms of this condition may vary depending on factors like gender and age. Symptoms may include:  Chest pain. This may feel like crushing, squeezing, tightness, or pressure.  Pain in the arm, neck, jaw, back, or upper body.  Shortness of breath. This may be at rest or with minimal activity.  Heartburn or indigestion.  Nausea.  Sudden cold sweats.  Feeling tired.  Sudden light-headedness.  Older people may have more subtle  symptoms, such as general tiredness or not feeling well. How is this diagnosed? This condition may be diagnosed through tests, such as:  Electrocardiogram (ECG) to measure the electrical activity of your heart.  Blood tests in which blood is drawn at scheduled times over several hours to check for the specific proteins or enzymes released by damaged heart muscle (cardiac markers).  Coronary angiogram. This is a procedure in which dye is injected into arteries and then X-rays are taken to evaluate blood flow and heart function.  CT scan of the chest, using dye to make it easy to see the heart.  Echocardiogram to evaluate heart motion and blood flow.  How is this treated? A heart attack must be treated as soon as possible. Treatment may include:  Oxygen therapy.  Medicines, such as: ? Medicine that breaks up or dissolves blood clots in the coronary artery (fibrinolytic therapy). ? Antiplatelet medicines and blood-thinning medicines, such as aspirin. These help prevent blood clots. ? Blood pressure medicines. ? Nitroglycerin. This medicine improves blood flow to the heart. ? Pain medicine. ? Cholesterol-reducing medicine.  Angioplasty and cardiac stent placement. This is a procedure to widen a blocked coronary artery by placing a small piece of metal that looks like a coil or spring (stent) into the artery. The stent helps keep the artery open by supporting the artery walls.  Open heart surgery (coronary artery bypass graft, CABG). In this procedure, a section of blood vessel from another part of the body (graft) is removed (harvested) and then inserted where it will allow blood to go around (bypass) the damaged part of the coronary artery.  Cardiac rehabilitation. This is a program that helps improve your health and well-being. It includes exercise training, education, and counseling to help you recover.  Follow these instructions at home: Medicines  Take over-the-counter and  prescription medicines only as told by your health care provider. You may need to take medicine after a heart attack to: ? Keep your blood from clotting too easily. ? Control your blood pressure and heart rate. ? Lower your cholesterol. ? Control abnormal heart rhythms.  Do not take the following medicines unless your health care provider says it is okay to take them: ? NSAIDs, such as ibuprofen, naproxen, or celecoxib (COX-2 inhibitors). ? Vitamin supplements that contain vitamin A, vitamin E, or both. ? Hormone replacement therapy that contains estrogen with or without progestin. Lifestyle  Do not use any products that contain nicotine or tobacco,  such as cigarettes and e-cigarettes. If you need help quitting, ask your health care provider.  Avoid secondhand smoke.  Exercise regularly. Ask your health care provider about participating in a cardiac rehabilitation program that helps you start exercising safely after a heart attack.  Eat a heart-healthy diet. Consider working with a diet and nutrition specialist (registered dietitian). A dietitian can help you learn healthy eating options.  Maintain a healthy weight.  Reduce your stress level.  Limit alcohol intake to no more than 1 drink a day for non-pregnant women and 2 drinks a day for men. One drink equals 12 oz. of beer, 5 oz. of wine, or 1 oz. of hard liquor. General instructions  Work with your health care provider to manage any other conditions you have, such as hypertension or diabetes. These conditions affect your heart.  Get screened for depression, and seek treatment if needed.  Keep your vaccinations up to date. Get the flu (influenza) vaccine every year.  Keep all follow-up visits as told by your health care provider. This is important. Get help right away if:  You have sudden, unexplained discomfort in your chest, arms, back, neck, jaw, or upper body.  You have shortness of breath at any time.  You suddenly  start to sweat or your skin gets clammy.  You feel nauseous or you vomit.  You suddenly feel light-headed or dizzy.  Your heart starts to beat fast or feels like it is skipping beats.  Your blood pressure is higher than 180/120.  You have thoughts about harming yourself or taking your own life. These symptoms may represent a serious problem that is an emergency. Do not wait to see if the symptoms will go away. Get medical help right away. Call your local emergency services (911 in the [place] Do not drive yourself to the hospital. Summary  A heart attack (myocardial infarction, MI) is a condition that occurs when an artery in the heart (coronary artery) becomes narrowed or blocked. The narrowing or blockage cuts off the blood and oxygen supply to the heart, which permanently damages the heart.  A heart attack is an emergency. Get help right away if you have sudden pain in your chest, arms, back, jaw, or upper body. Seek help if you have nausea or you vomit, or you become lightheaded or dizzy.  Treatment is a combination of oxygen, medicines and procedures, if needed, to open the blocked artery and restore blood flow to the heart. This information is not intended to replace advice given to you by your health care provider. Make sure you discuss any questions you have with your health care provider. Document Released: 02/26/2005 Document Revised: 04/03/2016 Document Reviewed: 04/03/2016 Elsevier Interactive Patient Education  [employer].

## 2017-03-31 NOTE — Progress Notes (Signed)
Pt had burst of wide complex tachycardia on telemetry nonsustained.  Otherwise sinus rhythm 60s.  Pt asymptomatic.  Will continue to monitor closely.

## 2017-04-01 ENCOUNTER — Encounter (HOSPITAL_COMMUNITY): Payer: Self-pay | Admitting: Cardiology

## 2017-04-01 DIAGNOSIS — I5021 Acute systolic (congestive) heart failure: Secondary | ICD-10-CM

## 2017-04-01 DIAGNOSIS — I4729 Other ventricular tachycardia: Secondary | ICD-10-CM

## 2017-04-01 DIAGNOSIS — I472 Ventricular tachycardia: Secondary | ICD-10-CM

## 2017-04-01 DIAGNOSIS — E785 Hyperlipidemia, unspecified: Secondary | ICD-10-CM

## 2017-04-01 DIAGNOSIS — E876 Hypokalemia: Secondary | ICD-10-CM

## 2017-04-01 LAB — COMPREHENSIVE METABOLIC PANEL
ALT: 27 U/L (ref 17–63)
AST: 45 U/L — ABNORMAL HIGH (ref 15–41)
Albumin: 3.2 g/dL — ABNORMAL LOW (ref 3.5–5.0)
Alkaline Phosphatase: 54 U/L (ref 38–126)
Anion gap: 11 (ref 5–15)
BUN: 22 mg/dL — ABNORMAL HIGH (ref 6–20)
CO2: 20 mmol/L — ABNORMAL LOW (ref 22–32)
Calcium: 9.1 mg/dL (ref 8.9–10.3)
Chloride: 108 mmol/L (ref 101–111)
Creatinine, Ser: 1.35 mg/dL — ABNORMAL HIGH (ref 0.61–1.24)
GFR calc Af Amer: >60 mL/min (ref >60)
GFR calc non Af Amer: 56 mL/min — ABNORMAL LOW (ref >60)
Glucose, Bld: 100 mg/dL — ABNORMAL HIGH (ref 65–99)
Potassium: 4.2 mmol/L (ref 3.5–5.1)
Sodium: 139 mmol/L (ref 135–145)
Total Bilirubin: 0.6 mg/dL (ref 0.3–1.2)
Total Protein: 5.9 g/dL — ABNORMAL LOW (ref 6.5–8.1)

## 2017-04-01 LAB — TROPONIN I: Troponin I: 8.09 ng/mL (ref <0.03)

## 2017-04-01 MED ORDER — ALPRAZOLAM 0.25 MG PO TABS: 0.2500 mg | ORAL_TABLET | Freq: Two times a day (BID) | ORAL | 0 refills | Status: DC | PRN

## 2017-04-01 NOTE — Care Management (Signed)
[id number] 04-01-16 CM did call the [facility] on [address] and medication Kary Kos is in stock. Pt asked CM about Rx for Xanax. CM did relay information to [employer] operational officer. No further needs from CM at this time. Bethena Roys, RN,BSN [phone]

## 2017-04-01 NOTE — Discharge Summary (Signed)
The patient has been seen in conjunction with Harlan Stains, [facility]. All aspects of care have been considered and discussed. The patient has been personally interviewed, examined, and all clinical data has been reviewed.   Proximal to mid LAD occlusion in the setting of acute anterior MI was treated with drug-eluting stent.  No significant residual disease.  LAD beyond the stented region contains tubeless 70% obstruction.  Significant problem with anxiety.  1 month supply of Xanax 0.25 mg twice daily as needed.  Imdur was discontinued because of headache.  Sublingual nitroglycerin should be used if recurrent chest discomfort.  If recurrent chest discomfort, notify [facility] heart care.  Discharge Summary    Patient ID: James [name],  MRN: [id number], DOB/AGE: 21-Aug-1960 57 y.o.  Admit date: 03/29/2017 Discharge date: 04/01/2017  Primary Care Provider: System, Pcp Not In Primary Cardiologist: [name]  Discharge Diagnoses    Principal Problem:   NSTEMI (non-ST elevated myocardial infarction) [facility]) Active Problems:   Hepatitis C   Acute systolic heart failure ([place])   Hyperlipidemia   Hypokalemia   NSVT (nonsustained ventricular tachycardia) ([place])   Allergies No Known Allergies  Diagnostic Studies/Procedures    Cath: 03/29/17  Conclusion     Prox LAD lesion is 100% stenosed.  Post intervention, there is a 0% residual stenosis.  A drug-eluting stent was successfully placed using a STENT SYNERGY DES 3.5X16.  The left ventricular systolic function is normal.  LV end diastolic pressure is normal.  The left ventricular ejection fraction is 45-50% by visual estimate.   1. Single vessel occlusive CAD- 100% mid LAD with collaterals 2. Mild LV dysfunction. EF 45% 3. Normal LVEDP 4. Successful aspiration thrombectomy and stenting of the mid LAD with DES   Plan: DAPT with ASA and Brilinta. IV Aggrastat for 18 hours. May be a candidate for DC Sunday if no complications.  High dose statin.   TTE: 03/30/17  Study Conclusions  - Left ventricle: Septal and apical akinesis inferior wall   hypokinesis The cavity size was mildly dilated. Wall thickness   was increased in a pattern of mild LVH. Systolic function was   mildly to moderately reduced. The estimated ejection fraction was   in the range of 40% to 45%. Left ventricular diastolic function   parameters were normal. - Atrial septum: No defect or patent foramen ovale was identified. _____________   History of Present Illness     57  yo WM with history of treated Hep C and family history of premature CAD presented for evaluation of prolonged chest pain. Continued chest pain throughout the day on the day of admission. He had additional pain with exertion. The sx finally worsened to the point that he sought medical help. His PCP did a troponin and then told him to come to the ER.   He was still having CP at a 2/10 in ER. His was placed  on IV Nitro, IV NS. He reported exercising regularly. He had not been having CP or DOE with exercise until the acute event.   EKG demonstrated SR, HR 79, no acute ischemic changes. Given symptoms and positive troponins he was admitted and taken for cardiac cath.   Hospital Course    Underwent cardiac cath noted above with 100% LAD lesion treated with PCI/DESx1. Plan for DAPT with ASA/Brilinta. Follow up echo showed EF of 45%. Troponin peaked 45.5. Was able to ambulate with cardiac rehab without recurrent chest pain post cath. Placed on BB and ACEi with blood  pressures tolerating. Post cath labs were stable with the exception of hypokalemia which was 3.3 and corrected. Did have a short run of NSVT that was asymptomatic. Noted to be using testosterone supp prior to admission and was advised to stop this at discharge. Plan was for discharge yesterday but developed some recurrent chest discomfort likely related to anxiety, but was held overnight. Felt better the following morning.  Of note patient has reported a hx of OSA. Has not been using Cpap at home. Would like to establish with new provider to further assess this as outpatient. Per Dr. Tamala Julian ok to send out with short term Rx for Xanax. Given a paper Rx for  10 tabs prior to discharge. Instructed will need to follow up with PCP if needed long term.   General: Well developed, well nourished, male appearing in no acute distress. Head: Normocephalic, atraumatic.  Neck: Supple without bruits, JVD. Lungs:  Resp regular and unlabored, CTA. Heart: RRR, S1, S2, no S3, S4, or murmur; no rub. Abdomen: Soft, non-tender, non-distended with normoactive bowel sounds. No hepatomegaly. No rebound/guarding. No obvious abdominal masses. Extremities: No clubbing, cyanosis, edema. Distal pedal pulses are 2+ bilaterally. R radial cath site stable without bruising or hematoma Neuro: Alert and oriented X 3. Moves all extremities spontaneously. Psych: Normal affect.  James [name] was seen by Dr. Tamala Julian and determined stable for discharge home. Follow up in the office has been arranged. Medications are listed below.   _____________  Discharge Vitals Blood pressure 112/79, pulse 67, temperature 97.7 F (36.5 C), temperature source Oral, resp. rate 20, height 5' 9.5" (1.765 m), weight 201 lb 8 oz (91.4 kg), SpO2 97 %.  Filed Weights   03/29/17 1330 03/31/17 0407 04/01/17 0340  Weight: 200 lb (90.7 kg) 201 lb 9.6 oz (91.4 kg) 201 lb 8 oz (91.4 kg)    Labs & Radiologic Studies    CBC Recent Labs    03/29/17 2029 03/30/17 0251  WBC 11.7* 8.7  HGB 15.9 15.2  HCT 47.0 45.6  MCV 90.2 90.8  PLT 223 664   Basic Metabolic Panel Recent Labs    03/31/17 1453 04/01/17 0330  NA 139 139  K 3.9 4.2  CL 109 108  CO2 22 20*  GLUCOSE 102* 100*  BUN 16 22*  CREATININE 1.13 1.35*  CALCIUM 8.8* 9.1  MG 2.0  --    Liver Function Tests Recent Labs    03/29/17 2029 04/01/17 0330  AST 166* 45*  ALT 36 27  ALKPHOS 54 54  BILITOT  0.9 0.6  PROT 6.3* 5.9*  ALBUMIN 3.8 3.2*   Recent Labs    03/29/17 1545  LIPASE 43   Cardiac Enzymes Recent Labs    03/30/17 0758 03/31/17 1146 04/01/17 0330  TROPONINI 31.48* 7.90* 8.09*   BNP Invalid input(s): POCBNP D-Dimer No results for input(s): DDIMER in the last 72 hours. Hemoglobin A1C Recent Labs    03/29/17 2029  HGBA1C 5.5   Fasting Lipid Panel Recent Labs    03/30/17 0251  CHOL 155  HDL 26*  LDLCALC 91  TRIG 191*  CHOLHDL 6.0   Thyroid Function Tests No results for input(s): TSH, T4TOTAL, T3FREE, THYROIDAB in the last 72 hours.  Invalid input(s): FREET3 _____________  Dg Chest 2 View  Result Date: 03/29/2017 CLINICAL DATA:  Chest pain EXAM: CHEST  2 VIEW COMPARISON:  None. FINDINGS: Lungs are clear. Heart size and pulmonary vascularity are normal. No adenopathy. No pneumothorax. No bone lesions. IMPRESSION: No edema  or consolidation. Electronically Signed   By: Lowella Grip III M.D.   On: 03/29/2017 14:09   Disposition   Pt is being discharged home today in good condition.  Follow-up Plans & Appointments    Follow-up Information    Almyra Deforest, [place] Follow up on 04/09/2017.   Specialties:  Cardiology, Radiology Why:  at 10am for your follow up appt. Please arrive 71mins prior to check in.  Contact information: [address] [place] [address] [phone]          Discharge Instructions    Amb Referral to Cardiac Rehabilitation   Complete by:  As directed    Diagnosis:   Coronary Stents PTCA NSTEMI     Call MD for:  redness, tenderness, or signs of infection (pain, swelling, redness, odor or green/yellow discharge around incision site)   Complete by:  As directed    Diet - low sodium heart healthy   Complete by:  As directed    Diet - low sodium heart healthy   Complete by:  As directed    Discharge instructions   Complete by:  As directed    NO HEAVY LIFTING (>10lbs) X 2 WEEKS. NO SEXUAL ACTIVITY X 2  WEEKS. NO DRIVING X 1 WEEK. NO SOAKING BATHS, HOT TUBS, POOLS, ETC., X 7 DAYS.   Discharge instructions   Complete by:  As directed    Radial Site Care Refer to this sheet in the next few weeks. These instructions provide you with information on caring for yourself after your procedure. Your caregiver may also give you more specific instructions. Your treatment has been planned according to current medical practices, but problems sometimes occur. Call your caregiver if you have any problems or questions after your procedure. HOME CARE INSTRUCTIONS You may shower the day after the procedure.Remove the bandage (dressing) and gently wash the site with plain soap and water.Gently pat the site dry.  Do not apply powder or lotion to the site.  Do not submerge the affected site in water for 3 to 5 days.  Inspect the site at least twice daily.  Do not flex or bend the affected arm for 24 hours.  No lifting over 5 pounds (2.3 kg) for 5 days after your procedure.  Do not drive home if you are discharged the same day of the procedure. Have someone else drive you.  You may drive 24 hours after the procedure unless otherwise instructed by your caregiver.  What to expect: Any bruising will usually fade within 1 to 2 weeks.  Blood that collects in the tissue (hematoma) may be painful to the touch. It should usually decrease in size and tenderness within 1 to 2 weeks.  SEEK IMMEDIATE MEDICAL CARE IF: You have unusual pain at the radial site.  You have redness, warmth, swelling, or pain at the radial site.  You have drainage (other than a small amount of blood on the dressing).  You have chills.  You have a fever or persistent symptoms for more than 72 hours.  You have a fever and your symptoms suddenly get worse.  Your arm becomes pale, cool, tingly, or numb.  You have heavy bleeding from the site. Hold pressure on the site.   PLEASE DO NOT MISS ANY DOSES OF YOUR BRILINTA!!!!! Also keep a log of you  blood pressures and bring back to your follow up appt. Please call the office with any questions.   Patients taking blood thinners should generally stay away  from medicines like ibuprofen, Advil, Motrin, naproxen, and Aleve due to risk of stomach bleeding. You may take Tylenol as directed or talk to your primary doctor about alternatives.   Increase activity slowly   Complete by:  As directed    Increase activity slowly   Complete by:  As directed      Discharge Medications     Medication List    STOP taking these medications   testosterone cypionate 200 MG/ML injection Commonly known as:  DEPOTESTOSTERONE CYPIONATE     TAKE these medications   ALPRAZolam 0.25 MG tablet Commonly known as:  XANAX Take 1 tablet (0.25 mg total) by mouth 2 (two) times daily as needed for anxiety.   aspirin 81 MG tablet Take 1 tablet (81 mg total) by mouth daily. What changed:    medication strength  how much to take  when to take this   atorvastatin 80 MG tablet Commonly known as:  LIPITOR Take 1 tablet (80 mg total) by mouth daily at 6 PM.   colchicine 0.6 MG tablet Take 0.6 mg by mouth 2 (two) times daily as needed (gout flare up).   lisinopril 2.5 MG tablet Commonly known as:  PRINIVIL,ZESTRIL Take 1 tablet (2.5 mg total) by mouth daily.   metoprolol succinate 25 MG 24 hr tablet Commonly known as:  TOPROL-XL Take 1 tablet (25 mg total) by mouth daily.   nitroGLYCERIN 0.4 MG SL tablet Commonly known as:  NITROSTAT Place 1 tablet (0.4 mg total) under the tongue every 5 (five) minutes as needed.   ticagrelor 90 MG Tabs tablet Commonly known as:  BRILINTA Take 1 tablet (90 mg total) by mouth 2 (two) times daily.   valACYclovir 1000 MG tablet Commonly known as:  VALTREX Take 1,000 mg by mouth daily as needed (for fever blisters). Reported on 09/22/2015        Aspirin prescribed at discharge?  Yes High Intensity Statin Prescribed? (Lipitor 40-80mg  or Crestor 20-40mg ):  Yes Beta Blocker Prescribed? Yes For EF <40%, was ACEI/ARB Prescribed? Yes ADP Receptor Inhibitor Prescribed? (i.e. Plavix etc.-Includes Medically Managed Patients): Yes For EF <40%, Aldosterone Inhibitor Prescribed? No: EF ok Was EF assessed during THIS hospitalization? Yes Was Cardiac Rehab II ordered? (Included Medically managed Patients): Yes   Outstanding Labs/Studies   FLP/LFTs in 6 weeks if tolerating statin.   Duration of Discharge Encounter   Greater than 30 minutes including physician time.  Signed, Reino Bellis NP-C 04/01/2017, 12:05 PM

## 2017-04-01 NOTE — Progress Notes (Addendum)
Interventional Cardiology Progress Note   Has ambulated, with no recurrence of chest discomfort.  Right radial access site is unremarkable  The digital images have been reviewed.  There is segmental 60-70% stenosis beyond the stent.  DC Imdur and use sublingual nitroglycerin if necessary.  Report any recurrent chest discomfort.  Okay to provide a one-week supply of Xanax 0.25 mg twice daily as needed for anxiety.  15 tablets or less.  Provide sublingual nitroglycerin to use if recurrent chest pain.  Needs 1 week follow-up  Needs sleep apnea study repeated with therapy as indicated.  LDL target less than 70.  Continue high intensity lipid lowering with statins  [name] is his primary cardiologist

## 2017-04-05 ENCOUNTER — Telehealth: Payer: Self-pay | Admitting: Physician Assistant

## 2017-04-05 NOTE — Telephone Encounter (Signed)
Returned call to [place], [place]. Explained that request for sleep apnea testing, treatment would be discussed at Apr 09, 2017 visit with PA.

## 2017-04-05 NOTE — Telephone Encounter (Signed)
New message  Pt fiance verbalized that she is calling for the RN   Calling establish care for sleep [place]

## 2017-04-08 ENCOUNTER — Encounter (HOSPITAL_COMMUNITY): Payer: Self-pay | Admitting: Cardiology

## 2017-04-08 ENCOUNTER — Emergency Department (HOSPITAL_COMMUNITY)
Admission: EM | Admit: 2017-04-08 | Discharge: 2017-04-08 | Disposition: A | Discharge status: Home / Self Care | Payer: BLUE CROSS/BLUE SHIELD | Attending: Emergency Medicine | Admitting: Emergency Medicine

## 2017-04-08 ENCOUNTER — Emergency Department (HOSPITAL_COMMUNITY): Payer: BLUE CROSS/BLUE SHIELD

## 2017-04-08 DIAGNOSIS — I252 Old myocardial infarction: Secondary | ICD-10-CM | POA: Diagnosis not present

## 2017-04-08 DIAGNOSIS — R0789 Other chest pain: Secondary | ICD-10-CM | POA: Insufficient documentation

## 2017-04-08 DIAGNOSIS — F17210 Nicotine dependence, cigarettes, uncomplicated: Secondary | ICD-10-CM | POA: Diagnosis not present

## 2017-04-08 DIAGNOSIS — R002 Palpitations: Secondary | ICD-10-CM | POA: Diagnosis not present

## 2017-04-08 DIAGNOSIS — Z7982 Long term (current) use of aspirin: Secondary | ICD-10-CM | POA: Diagnosis not present

## 2017-04-08 DIAGNOSIS — Z96641 Presence of right artificial hip joint: Secondary | ICD-10-CM | POA: Insufficient documentation

## 2017-04-08 DIAGNOSIS — Z85828 Personal history of other malignant neoplasm of skin: Secondary | ICD-10-CM | POA: Insufficient documentation

## 2017-04-08 DIAGNOSIS — I5021 Acute systolic (congestive) heart failure: Secondary | ICD-10-CM | POA: Diagnosis not present

## 2017-04-08 DIAGNOSIS — R079 Chest pain, unspecified: Secondary | ICD-10-CM | POA: Diagnosis not present

## 2017-04-08 DIAGNOSIS — R0602 Shortness of breath: Secondary | ICD-10-CM | POA: Diagnosis not present

## 2017-04-08 LAB — BASIC METABOLIC PANEL
Anion gap: 11 (ref 5–15)
BUN: 26 mg/dL — ABNORMAL HIGH (ref 6–20)
CO2: 22 mmol/L (ref 22–32)
Calcium: 9.4 mg/dL (ref 8.9–10.3)
Chloride: 103 mmol/L (ref 101–111)
Creatinine, Ser: 1.2 mg/dL (ref 0.61–1.24)
GFR calc Af Amer: >60 mL/min (ref >60)
GFR calc non Af Amer: >60 mL/min (ref >60)
Glucose, Bld: 98 mg/dL (ref 65–99)
Potassium: 3.9 mmol/L (ref 3.5–5.1)
Sodium: 136 mmol/L (ref 135–145)

## 2017-04-08 LAB — CBC
HCT: 49.7 % (ref 39.0–52.0)
Hemoglobin: 16.7 g/dL (ref 13.0–17.0)
MCH: 30.3 pg (ref 26.0–34.0)
MCHC: 33.6 g/dL (ref 30.0–36.0)
MCV: 90 fL (ref 78.0–100.0)
Platelets: 257 10*3/uL (ref 150–400)
RBC: 5.52 MIL/uL (ref 4.22–5.81)
RDW: 13.4 % (ref 11.5–15.5)
WBC: 9.1 10*3/uL (ref 4.0–10.5)

## 2017-04-08 LAB — I-STAT TROPONIN, ED: Troponin i, poc: 0.06 ng/mL (ref 0.00–0.08)

## 2017-04-08 MED ORDER — ALPRAZOLAM 0.25 MG PO TABS: 0.2500 mg | ORAL_TABLET | Freq: Two times a day (BID) | ORAL | 0 refills | Status: DC | PRN

## 2017-04-08 NOTE — Discharge Instructions (Signed)
Your palpitations appear to be primarily from PACs, also known as premature atrial contractions.  These tend to be very benign, and while noticeable, they are not serious.  There does not appear to be any damage to your heart, or indication that you are likely to have a heart attack.  Continue taking your usual medications, but reserve use of nitroglycerin for actual chest pain, not for palpitations.  This type of painful discomfort, is usually a squeezing or pressure-like sensation.  Return here, if needed, for problems.  Follow-up with your cardiology provider tomorrow, as scheduled.

## 2017-04-08 NOTE — ED Notes (Signed)
Pt verbalizes understanding of d/c instructions. Pt received prescriptions. Pt ambulatory at d/c with all belongings.  

## 2017-04-08 NOTE — ED Notes (Signed)
Patient transported to X-ray 

## 2017-04-08 NOTE — ED Provider Notes (Signed)
[facility] EMERGENCY DEPARTMENT Provider Note   CSN: [id number] Arrival date & time: 04/08/17  1924     History   Chief Complaint Chief Complaint  Patient presents with  . Chest Pain    HPI James [name] is a 57 y.o. male.  He is here for palpitations which have been ongoing and persistent since the day of his cardiac catheterization, angioplasty and stent placed for proximal LAD lesion.  He was discharged from the hospital about 1 week ago, and has a follow-up appointment, tomorrow.  He is taking his usual medications, walking some, but unable to participate in his usual exercise activities, because he was told to avoid heavy exertion.  He typically does both cardiac and weight training daily.  He has a follow-up appointment tomorrow morning with his cardiology provider.  He denies cough, shortness of breath, weakness, dizziness, paresthesias, nausea or vomiting.  He occasionally has a tight feeling in his neck that comes and goes but is not related to exertion.  When specifically asked about chest pain, the patient denies having anterior chest pain or discomfort which radiates to his arms.  There are no other known modifying factors.    HPI  Past Medical History:  Diagnosis Date  . Acute systolic congestive heart failure ([place])   . Anemia    slight anemia with hepatitis tx  . Arthritis    oa  . 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 GERD (gastroesophageal reflux disease)   . Hepatitis C 04/29/2012   had savalta treatment 2 years ago, now cured  . Hyperlipidemia   . NSTEMI (non-ST elevated myocardial infarction) ([place]) 03/29/2017   1/19 PCI/DESx1 to mLAD, EF 45%  . Palpitations    at times     Patient Active Problem List   Diagnosis Date Noted  . Acute systolic heart failure () 04/01/2017  . Hyperlipidemia 04/01/2017  . Hypokalemia 04/01/2017  . NSVT (nonsustained ventricular tachycardia) ([place]) 04/01/2017  .  NSTEMI (non-ST elevated myocardial infarction) ([place]) 03/29/2017  . Overweight (BMI 25.0-29.9) 01/12/2015  . 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 Hepatitis C 04/29/2012  . Polycythemia 08/22/2011    Past Surgical History:  Procedure Laterality Date  . ARTHROSCOPIC REPAIR ACL Right 15 years ago  . CORONARY STENT INTERVENTION N/A 03/29/2017   Procedure: CORONARY STENT INTERVENTION;  Surgeon: Martinique, Peter M, MD;  Location: [facility];  Service: Cardiovascular;  Laterality: N/A;  . CORONARY THROMBECTOMY N/A 03/29/2017   Procedure: Coronary Thrombectomy;  Surgeon: Martinique, Peter M, MD;  Location: [facility];  Service: Cardiovascular;  Laterality: N/A;  . LEFT HEART CATH AND CORONARY ANGIOGRAPHY N/A 03/29/2017   Procedure: LEFT HEART CATH AND CORONARY ANGIOGRAPHY;  Surgeon: Martinique, Peter M, MD;  Location: [facility];  Service: Cardiovascular;  Laterality: N/A;  . TOTAL HIP ARTHROPLASTY Right 01/11/2015   Procedure: RIGHT TOTAL HIP ARTHROPLASTY ANTERIOR APPROACH;  Surgeon: Paralee Cancel, MD;  Location: [facility];  Service: Orthopedics;  Laterality: Right;       Home Medications    Prior to Admission medications   Medication Sig Start Date End Date Taking? Authorizing Provider  ALPRAZolam (XANAX) 0.25 MG tablet Take 1 tablet (0.25 mg total) by mouth 2 (two) times daily as needed for anxiety. 04/08/17   Daleen Bo, MD  aspirin 81 MG tablet Take 1 tablet (81 mg total) by mouth daily. 03/31/17   Leanor Kail, PA  atorvastatin (LIPITOR) 80 MG tablet Take 1 tablet (80 mg total) by mouth daily at 6 PM. 03/31/17   Bhagat, Bhavinkumar, PA  colchicine 0.6 MG tablet Take 0.6 mg by mouth 2 (two) times daily as needed (gout flare up).  12/14/15   [provider]  lisinopril (PRINIVIL,ZESTRIL) 2.5 MG tablet Take 1 tablet (2.5 mg total) by mouth daily. 03/31/17 03/31/18  Leanor Kail, PA  metoprolol succinate (TOPROL-XL) 25 MG 24 hr tablet Take 1 tablet (25 mg total) by mouth  daily. 03/31/17   Bhagat, Crista Luria, PA  nitroGLYCERIN (NITROSTAT) 0.4 MG SL tablet Place 1 tablet (0.4 mg total) under the tongue every 5 (five) minutes as needed. 03/31/17   Leanor Kail, PA  ticagrelor (BRILINTA) 90 MG TABS tablet Take 1 tablet (90 mg total) by mouth 2 (two) times daily. 03/31/17   Bhagat, Crista Luria, PA  valACYclovir (VALTREX) 1000 MG tablet Take 1,000 mg by mouth daily as needed (for fever blisters). Reported on 09/22/2015 11/18/14   [provider]    Family History Family History  Problem Relation Age of Onset  . Alzheimer's disease Mother   . Heart failure Father   . CAD Father     Social History Social History   Tobacco Use  . Smoking status: Light Tobacco Smoker    Types: Cigars  . Smokeless tobacco: Never Used  . Tobacco comment: cigar  Substance Use Topics  . Alcohol use: No  . Drug use: No     Allergies   Patient has no known allergies.   Review of Systems Review of Systems  All other systems reviewed and are negative.    Physical Exam Updated Vital Signs BP (!) 135/97   Pulse 95   Temp 98.5 F (36.9 C) (Oral)   Resp 15   SpO2 95%   Physical Exam  Constitutional: He is oriented to person, place, and time. He appears well-developed and well-nourished. He does not appear ill.  HENT:  Head: Normocephalic and atraumatic.  Right Ear: External ear normal.  Left Ear: External ear normal.  Eyes: Conjunctivae and EOM are normal. Pupils are equal, round, and reactive to light.  Neck: Normal range of motion and phonation normal. Neck supple.  Cardiovascular: Normal rate, regular rhythm and normal heart sounds.  Mild left anterior chest wall tenderness with light palpation.  No associated crepitation or deformity.  Pulmonary/Chest: Effort normal and breath sounds normal. He exhibits no bony tenderness.  Abdominal: Soft. There is no tenderness.  Musculoskeletal: Normal range of motion.  Neurological: He is alert and oriented to  person, place, and time. No cranial nerve deficit or sensory deficit. He exhibits normal muscle tone. Coordination normal.  Skin: Skin is warm, dry and intact.  Psychiatric: His behavior is normal. Judgment and thought content normal. His mood appears anxious (Mild).  Nursing note and vitals reviewed.    ED Treatments / Results  Labs (all labs ordered are listed, but only abnormal results are displayed) Labs Reviewed  BASIC METABOLIC PANEL - Abnormal; Notable for the following components:      Result Value   BUN 26 (*)    All other components within normal limits  CBC  I-STAT TROPONIN, ED    EKG  EKG Interpretation  Date/Time:  Monday April 08 2017 19:31:12 EST Ventricular Rate:  80 PR Interval:  162 QRS Duration: 86 QT Interval:  324 QTC Calculation: 373 R Axis:   7 Text Interpretation:  Sinus rhythm with Premature atrial complexes Possible Anterior infarct ,  age undetermined Abnormal ECG improved T wave changes laterally Otherwise no significant change Confirmed by Addison Lank [phone]) on 04/08/2017 7:35:28 PM       Radiology Dg Chest 2 View  Result Date: 04/08/2017 CLINICAL DATA:  Chest pain, shortness of Breath EXAM: CHEST  2 VIEW COMPARISON:  03/29/2017 FINDINGS: Heart is upper limits normal in size. Lungs are clear. No effusions or acute bony abnormality. IMPRESSION: No active cardiopulmonary disease. Electronically Signed   By: Rolm Baptise M.D.   On: 04/08/2017 21:18    Procedures Procedures (including critical care time)  Medications Ordered in ED Medications - No data to display   Initial Impression / Assessment and Plan / ED Course  I have reviewed the triage vital signs and the nursing notes.  Pertinent labs & imaging results that were available during my care of the patient were reviewed by me and considered in my medical decision making (see chart for details).  Clinical Course as of Apr 08 2212  Mon Apr 08, 2017  2211 Cardiac monitor indicates  frequent PACs at this time.  [EW]    Clinical Course User Index [EW] Daleen Bo, MD      Patient Vitals for the past 24 hrs:  BP Temp Temp src Pulse Resp SpO2  04/08/17 2045 - - - 95 15 95 %  04/08/17 2030 - - - 84 18 97 %  04/08/17 1931 (!) 135/97 98.5 F (36.9 C) Oral 86 20 99 %    10:11 PM Reevaluation with update and discussion. After initial assessment and treatment, an updated evaluation reveals no change in clinical status, findings discussed with patient and sister, all questions answered. Daleen Bo      Final Clinical Impressions(s) / ED Diagnoses   Final diagnoses:  Palpitation   Patient with chest discomfort which is best described as palpitation, and not pain.  He occasionally has discomfort in his neck which he describes as a squeezing sensation.  The symptoms are ongoing since his cardiac catheterization with stenting, 10 days ago.  ED evaluation is very reassuring.  Troponin is normal.  This is improved from prior, elevation.  CBC and chemistry panels are normal.  Chest x-ray normal.  EKG shows only PACs, no PVCs, no ischemia and improving lateral T wave abnormality.  Patient has anxiety previously treated with Xanax, and may benefit from a  few more days of treatment.patient is stable for discharge with follow-up as planned.  Nursing Notes Reviewed/ Care Coordinated Applicable Imaging Reviewed Interpretation of Laboratory Data incorporated into ED treatment  The patient appears reasonably screened and/or stabilized for discharge and I doubt any other medical condition or other [facility] requiring further screening, evaluation, or treatment in the ED at this time prior to discharge.  Plan: Home Medications-continue usual medications; Home Treatments-rest, fluids, gradually increase activity; return here if the recommended treatment, does not improve the symptoms; Recommended follow up-cardiology tomorrow as planned.   ED Discharge Orders        Ordered     ALPRAZolam (XANAX) 0.25 MG tablet  2 times daily PRN     04/08/17 2205       Daleen Bo, MD 04/08/17 2218

## 2017-04-08 NOTE — ED Triage Notes (Signed)
Pt states that he has been having a CP since he left here last week. Pt states that today his throat feels tight and having palpations. Pt took one nitro PTA with some relief. Some SOB today and dizziness, denies n/v.

## 2017-04-09 ENCOUNTER — Encounter: Payer: Self-pay | Admitting: Physician Assistant

## 2017-04-09 ENCOUNTER — Ambulatory Visit (INDEPENDENT_AMBULATORY_CARE_PROVIDER_SITE_OTHER): Payer: BLUE CROSS/BLUE SHIELD | Admitting: Physician Assistant

## 2017-04-09 VITALS — BP 113/72 | HR 88 | Ht 70.0 in | Wt 202.0 lb

## 2017-04-09 DIAGNOSIS — I214 Non-ST elevation (NSTEMI) myocardial infarction: Secondary | ICD-10-CM | POA: Diagnosis not present

## 2017-04-09 DIAGNOSIS — G4733 Obstructive sleep apnea (adult) (pediatric): Secondary | ICD-10-CM

## 2017-04-09 DIAGNOSIS — D751 Secondary polycythemia: Secondary | ICD-10-CM | POA: Diagnosis not present

## 2017-04-09 DIAGNOSIS — E785 Hyperlipidemia, unspecified: Secondary | ICD-10-CM

## 2017-04-09 DIAGNOSIS — I2510 Atherosclerotic heart disease of native coronary artery without angina pectoris: Secondary | ICD-10-CM | POA: Diagnosis not present

## 2017-04-09 MED ORDER — METOPROLOL SUCCINATE ER 25 MG PO TB24: 25.0000 mg | ORAL_TABLET | Freq: Every day | ORAL | 6 refills | Status: DC

## 2017-04-09 MED ORDER — LISINOPRIL 2.5 MG PO TABS: 2.5000 mg | ORAL_TABLET | Freq: Every day | ORAL | 6 refills | Status: DC

## 2017-04-09 MED ORDER — COLCHICINE 0.6 MG PO TABS: 0.6000 mg | ORAL_TABLET | Freq: Two times a day (BID) | ORAL | 6 refills | Status: DC | PRN

## 2017-04-09 MED ORDER — TICAGRELOR 90 MG PO TABS: 90.0000 mg | ORAL_TABLET | Freq: Two times a day (BID) | ORAL | 6 refills | Status: DC

## 2017-04-09 MED ORDER — ATORVASTATIN CALCIUM 80 MG PO TABS: 80.0000 mg | ORAL_TABLET | Freq: Every day | ORAL | 6 refills | Status: DC

## 2017-04-09 NOTE — Patient Instructions (Addendum)
Medication Instructions:  Your physician recommends that you continue on your current medications as directed. Please refer to the Current Medication list given to you today.  Labwork: Your physician recommends that you return for lab work in: 6-8 weeks FASTING-LIPID, LFT  Testing/Procedures: None  Follow-Up: Your physician recommends that you schedule a follow-up appointment in: 2-3 months with Dr Martinique  You have been referred to [facility] in [place], [place]  Schedule appointment with Dr Claiborne Billings for Sleep-reason: OSA   Any Other Special Instructions Will Be Listed Below (If Applicable). If you need a refill on your cardiac medications before your next appointment, please call your pharmacy.

## 2017-04-09 NOTE — Progress Notes (Signed)
Cardiology Office Note    Date:  04/10/2017   ID:  James [name], DOB 02/05/1961, MRN [id number]  PCP:  [name], MD  Cardiologist:  [name]   Chief Complaint  Patient presents with  . Follow-up    toc-palpitations, occassional shortness of breath, discuss sleep apnea    History of Present Illness:  James [name] is a 57 y.o. male with PMH of basal cell skin CA, polycythemia and hepatitis C recently presented with NSTEMI on 03/29/2017.  Troponin on a peak at 45.54.  Hemoglobin A1c 5.5.  Total cholesterol 155, HDL 26, LDL 91, triglyceride 191.  Urgent cardiac catheterization on the same day showed 100% proximal LAD lesion treated with thrombectomy and 3.5 x 16 mm Synergy DES, EF 45%.  Postprocedure, he was on IV Aggrastat for 18 hours.  He was also placed on aspirin, Brilinta and high-dose statin.  Imdur was discontinued due to headache.  Given his significant problem with anxiety, 1 month supply of as needed Xanax was given.  He presents today for cardiology office visit.  He denies any significant chest discomfort.  He occasionally has some tightness in the neck.  Otherwise he has been compliant with his medication.  He works in Personal assistant and resides in both [place] and [place].  He wished to be referred to cardiac rehab in [place].  He is still dealing with significant anxiety issues.  He is aware that if he has any neck discomfort and chest discomfort, he would need to take nitroglycerin.  Otherwise he is on good cardiac medication at this point.  While his primary concern is obstructive sleep apnea, I have referred him to [name] for management of sleep apnea.  He says he could not tolerate a full facial mask, however may be able to tolerate a nasal pillow.   Past Medical History:  Diagnosis Date  . Acute systolic congestive heart failure ([facility])   . Anemia    slight anemia with hepatitis tx  . Arthritis    oa  . Cancer ([facility]) 4 months ago   basal cell skin  cancer removed from chest area  . GERD (gastroesophageal reflux disease)   . Hepatitis C 04/29/2012   had savalta treatment 2 years ago, now cured  . Hyperlipidemia   . NSTEMI (non-ST elevated myocardial infarction) ([facility]) 03/29/2017   1/19 PCI/DESx1 to mLAD, EF 45%  . Palpitations    at times     Past Surgical History:  Procedure Laterality Date  . ARTHROSCOPIC REPAIR ACL Right 15 years ago  . CORONARY STENT INTERVENTION N/A 03/29/2017   Procedure: CORONARY STENT INTERVENTION;  Surgeon: Martinique, Peter M, MD;  Location: [facility];  Service: Cardiovascular;  Laterality: N/A;  . CORONARY THROMBECTOMY N/A 03/29/2017   Procedure: Coronary Thrombectomy;  Surgeon: Martinique, Peter M, MD;  Location: [facility];  Service: Cardiovascular;  Laterality: N/A;  . LEFT HEART CATH AND CORONARY ANGIOGRAPHY N/A 03/29/2017   Procedure: LEFT HEART CATH AND CORONARY ANGIOGRAPHY;  Surgeon: Martinique, Peter M, MD;  Location: [facility];  Service: Cardiovascular;  Laterality: N/A;  . TOTAL HIP ARTHROPLASTY Right 01/11/2015   Procedure: RIGHT TOTAL HIP ARTHROPLASTY ANTERIOR APPROACH;  Surgeon: Paralee Cancel, MD;  Location: [facility];  Service: Orthopedics;  Laterality: Right;    Current Medications: No facility-administered medications prior to visit.    Outpatient Medications Prior to Visit  Medication Sig Dispense Refill  . ALPRAZolam (XANAX) 0.25 MG tablet Take 1 tablet (  0.25 mg total) by mouth 2 (two) times daily as needed for anxiety. 20 tablet 0  . aspirin 81 MG tablet Take 1 tablet (81 mg total) by mouth daily. 90 tablet 3  . nitroGLYCERIN (NITROSTAT) 0.4 MG SL tablet Place 1 tablet (0.4 mg total) under the tongue every 5 (five) minutes as needed. 25 tablet 12  . valACYclovir (VALTREX) 1000 MG tablet Take 1,000 mg by mouth daily as needed (for fever blisters). Reported on 09/22/2015  2  . atorvastatin (LIPITOR) 80 MG tablet Take 1 tablet (80 mg total) by mouth daily at 6 PM. 30 tablet 6  .  colchicine 0.6 MG tablet Take 0.6 mg by mouth 2 (two) times daily as needed (gout flare up).   0  . lisinopril (PRINIVIL,ZESTRIL) 2.5 MG tablet Take 1 tablet (2.5 mg total) by mouth daily. 30 tablet 6  . metoprolol succinate (TOPROL-XL) 25 MG 24 hr tablet Take 1 tablet (25 mg total) by mouth daily. 30 tablet 6  . ticagrelor (BRILINTA) 90 MG TABS tablet Take 1 tablet (90 mg total) by mouth 2 (two) times daily. 60 tablet 11     Allergies:   Patient has no known allergies.   Social History   Socioeconomic History  . Marital status: Single    Spouse name: None  . Number of children: None  . Years of education: None  . Highest education level: None  Social Needs  . Financial resource strain: None  . Food insecurity - worry: None  . Food insecurity - inability: None  . Transportation needs - medical: None  . Transportation needs - non-medical: None  Occupational History  . Occupation:  Financial controller: [employer]  Tobacco Use  . Smoking status: Light Tobacco Smoker    Types: Cigars  . Smokeless tobacco: Never Used  . Tobacco comment: cigar  Substance and Sexual Activity  . Alcohol use: No  . Drug use: No  . Sexual activity: None  Other Topics Concern  . None  Social History Narrative  . None     Family History:  The patient's family history includes Alzheimer's disease in his mother; CAD in his father; Heart failure in his father.   ROS:   Please see the history of present illness.    ROS All other systems reviewed and are negative.   PHYSICAL EXAM:   VS:  BP 113/72   Pulse 88   Ht 5\' 10"  (1.778 m)   Wt 202 lb (91.6 kg)   BMI 28.98 kg/m    GEN: Well nourished, well developed, in no acute distress  HEENT: normal  Neck: no JVD, carotid bruits, or masses Cardiac: RRR; no murmurs, rubs, or gallops,no edema  Respiratory:  clear to auscultation bilaterally, normal work of breathing GI: soft, nontender, nondistended, + BS MS: no deformity or atrophy    Skin: warm and dry, no rash Neuro:  Alert and Oriented x 3, Strength and sensation are intact Psych: euthymic mood, full affect  Wt Readings from Last 3 Encounters:  04/10/17 201 lb (91.2 kg)  04/09/17 202 lb (91.6 kg)  04/01/17 201 lb 8 oz (91.4 kg)      Studies/Labs Reviewed:   EKG:  EKG is not ordered today.    Recent Labs: 03/31/2017: Magnesium 2.0 04/01/2017: ALT 27 04/10/2017: BUN 23; Creatinine, Ser 1.09; Hemoglobin 16.7; Platelets 235; Potassium 4.5; Sodium 140   Lipid Panel    Component Value Date/Time   CHOL 155 03/30/2017 0251  TRIG 191 (H) 03/30/2017 0251   HDL 26 (L) 03/30/2017 0251   CHOLHDL 6.0 03/30/2017 0251   VLDL 38 03/30/2017 0251   LDLCALC 91 03/30/2017 0251    Additional studies/ records that were reviewed today include:   Cath 03/29/2017 Conclusion     Prox LAD lesion is 100% stenosed.  Post intervention, there is a 0% residual stenosis.  A drug-eluting stent was successfully placed using a STENT SYNERGY DES 3.5X16.  The left ventricular systolic function is normal.  LV end diastolic pressure is normal.  The left ventricular ejection fraction is 45-50% by visual estimate.   1. Single vessel occlusive CAD- 100% mid LAD with collaterals 2. Mild LV dysfunction. EF 45% 3. Normal LVEDP 4. Successful aspiration thrombectomy and stenting of the mid LAD with DES   Plan: DAPT with ASA and Brilinta. IV Aggrastat for 18 hours. May be a candidate for DC Sunday if no complications. High dose statin.    Echo 03/30/2017 LV EF: 40% -   45%  Study Conclusions  - Left ventricle: Septal and apical akinesis inferior wall   hypokinesis The cavity size was mildly dilated. Wall thickness   was increased in a pattern of mild LVH. Systolic function was   mildly to moderately reduced. The estimated ejection fraction was   in the range of 40% to 45%. Left ventricular diastolic function   parameters were normal. - Atrial septum: No defect or patent  foramen ovale was identified.   ASSESSMENT:    1. NSTEMI (non-ST elevated myocardial infarction) ([facility])   2. Hyperlipidemia, unspecified hyperlipidemia type   3. Polycythemia   4. Coronary artery disease involving native coronary artery of native heart without angina pectoris   5. OSA (obstructive sleep apnea)      PLAN:  In order of problems listed above:  1. CAD: He went to the emergency room last night with throat tightness.  He has been ruled out with negative enzyme.  I would recommend continuing aspirin and Brilinta at this time.  He is also on high-dose statin and BB  2. Hyperlipidemia: Fasting lipid panel and LFT in 6-8 weeks.  3. Polycythemia: Managed by Dr. Julien Nordmann.  4. Obstructive sleep apnea: Refer to [name], could not tolerate full facial mask before.    Medication Adjustments/Labs and Tests Ordered: Current medicines are reviewed at length with the patient today.  Concerns regarding medicines are outlined above.  Medication changes, Labs and Tests ordered today are listed in the Patient Instructions below. Patient Instructions  Medication Instructions:  Your physician recommends that you continue on your current medications as directed. Please refer to the Current Medication list given to you today.  Labwork: Your physician recommends that you return for lab work in: 6-8 weeks FASTING-LIPID, LFT  Testing/Procedures: None  Follow-Up: Your physician recommends that you schedule a follow-up appointment in: 2-3 months with Dr Martinique  You have been referred to [facility] in [place], [place]  Schedule appointment with Dr Claiborne Billings for Sleep-reason: OSA   Any Other Special Instructions Will Be Listed Below (If Applicable). If you need a refill on your cardiac medications before your next appointment, please call your pharmacy.     Hilbert Corrigan, [place]  04/10/2017 10:25 AM    [address], [place], [place]  [address] Phone:  [phone]; Fax: [phone]

## 2017-04-10 ENCOUNTER — Other Ambulatory Visit (HOSPITAL_COMMUNITY): Payer: BLUE CROSS/BLUE SHIELD

## 2017-04-10 ENCOUNTER — Encounter (HOSPITAL_COMMUNITY)
Admission: EM | Disposition: A | Discharge status: Home / Self Care | Payer: Self-pay | Source: Home / Self Care | Attending: Emergency Medicine

## 2017-04-10 ENCOUNTER — Other Ambulatory Visit: Payer: Self-pay

## 2017-04-10 ENCOUNTER — Observation Stay (HOSPITAL_COMMUNITY)
Admission: EM | Admit: 2017-04-10 | Discharge: 2017-04-11 | Disposition: A | Discharge status: Home / Self Care | Payer: BLUE CROSS/BLUE SHIELD | Attending: Internal Medicine | Admitting: Internal Medicine

## 2017-04-10 ENCOUNTER — Emergency Department (HOSPITAL_COMMUNITY): Payer: BLUE CROSS/BLUE SHIELD

## 2017-04-10 ENCOUNTER — Encounter: Payer: Self-pay | Admitting: Physician Assistant

## 2017-04-10 ENCOUNTER — Encounter (HOSPITAL_COMMUNITY): Payer: Self-pay | Admitting: Emergency Medicine

## 2017-04-10 ENCOUNTER — Observation Stay (HOSPITAL_BASED_OUTPATIENT_CLINIC_OR_DEPARTMENT_OTHER): Payer: BLUE CROSS/BLUE SHIELD

## 2017-04-10 DIAGNOSIS — Z7902 Long term (current) use of antithrombotics/antiplatelets: Secondary | ICD-10-CM | POA: Diagnosis not present

## 2017-04-10 DIAGNOSIS — Z96641 Presence of right artificial hip joint: Secondary | ICD-10-CM | POA: Insufficient documentation

## 2017-04-10 DIAGNOSIS — I214 Non-ST elevation (NSTEMI) myocardial infarction: Principal | ICD-10-CM | POA: Insufficient documentation

## 2017-04-10 DIAGNOSIS — Z8619 Personal history of other infectious and parasitic diseases: Secondary | ICD-10-CM | POA: Diagnosis not present

## 2017-04-10 DIAGNOSIS — R079 Chest pain, unspecified: Secondary | ICD-10-CM

## 2017-04-10 DIAGNOSIS — Z85828 Personal history of other malignant neoplasm of skin: Secondary | ICD-10-CM | POA: Diagnosis not present

## 2017-04-10 DIAGNOSIS — I2510 Atherosclerotic heart disease of native coronary artery without angina pectoris: Secondary | ICD-10-CM | POA: Diagnosis not present

## 2017-04-10 DIAGNOSIS — E663 Overweight: Secondary | ICD-10-CM | POA: Diagnosis not present

## 2017-04-10 DIAGNOSIS — I5021 Acute systolic (congestive) heart failure: Secondary | ICD-10-CM | POA: Diagnosis not present

## 2017-04-10 DIAGNOSIS — N182 Chronic kidney disease, stage 2 (mild): Secondary | ICD-10-CM | POA: Diagnosis present

## 2017-04-10 DIAGNOSIS — Z6829 Body mass index (BMI) 29.0-29.9, adult: Secondary | ICD-10-CM | POA: Diagnosis not present

## 2017-04-10 DIAGNOSIS — Z8249 Family history of ischemic heart disease and other diseases of the circulatory system: Secondary | ICD-10-CM | POA: Diagnosis not present

## 2017-04-10 DIAGNOSIS — Z7982 Long term (current) use of aspirin: Secondary | ICD-10-CM | POA: Insufficient documentation

## 2017-04-10 DIAGNOSIS — Z79899 Other long term (current) drug therapy: Secondary | ICD-10-CM | POA: Insufficient documentation

## 2017-04-10 DIAGNOSIS — Z955 Presence of coronary angioplasty implant and graft: Secondary | ICD-10-CM | POA: Insufficient documentation

## 2017-04-10 DIAGNOSIS — I25110 Atherosclerotic heart disease of native coronary artery with unstable angina pectoris: Secondary | ICD-10-CM | POA: Diagnosis not present

## 2017-04-10 DIAGNOSIS — F17290 Nicotine dependence, other tobacco product, uncomplicated: Secondary | ICD-10-CM | POA: Diagnosis not present

## 2017-04-10 DIAGNOSIS — F419 Anxiety disorder, unspecified: Secondary | ICD-10-CM | POA: Diagnosis not present

## 2017-04-10 DIAGNOSIS — I130 Hypertensive heart and chronic kidney disease with heart failure and stage 1 through stage 4 chronic kidney disease, or unspecified chronic kidney disease: Secondary | ICD-10-CM | POA: Diagnosis not present

## 2017-04-10 DIAGNOSIS — I252 Old myocardial infarction: Secondary | ICD-10-CM | POA: Diagnosis not present

## 2017-04-10 DIAGNOSIS — E785 Hyperlipidemia, unspecified: Secondary | ICD-10-CM | POA: Diagnosis not present

## 2017-04-10 DIAGNOSIS — I5022 Chronic systolic (congestive) heart failure: Secondary | ICD-10-CM | POA: Insufficient documentation

## 2017-04-10 DIAGNOSIS — N289 Disorder of kidney and ureter, unspecified: Secondary | ICD-10-CM | POA: Insufficient documentation

## 2017-04-10 HISTORY — PX: LEFT HEART CATH AND CORONARY ANGIOGRAPHY: CATH118249

## 2017-04-10 LAB — BASIC METABOLIC PANEL
Anion gap: 11 (ref 5–15)
Anion gap: 9 (ref 5–15)
BUN: 27 mg/dL — ABNORMAL HIGH (ref 6–20)
BUN: 23 mg/dL — ABNORMAL HIGH (ref 6–20)
CO2: 26 mmol/L (ref 22–32)
CO2: 24 mmol/L (ref 22–32)
Calcium: 9.4 mg/dL (ref 8.9–10.3)
Calcium: 9.1 mg/dL (ref 8.9–10.3)
Chloride: 103 mmol/L (ref 101–111)
Chloride: 107 mmol/L (ref 101–111)
Creatinine, Ser: 1.37 mg/dL — ABNORMAL HIGH (ref 0.61–1.24)
Creatinine, Ser: 1.09 mg/dL (ref 0.61–1.24)
GFR calc Af Amer: >60 mL/min (ref >60)
GFR calc Af Amer: >60 mL/min (ref >60)
GFR calc non Af Amer: 55 mL/min — ABNORMAL LOW (ref >60)
GFR calc non Af Amer: >60 mL/min (ref >60)
Glucose, Bld: 100 mg/dL — ABNORMAL HIGH (ref 65–99)
Glucose, Bld: 97 mg/dL (ref 65–99)
Potassium: 3.7 mmol/L (ref 3.5–5.1)
Potassium: 4.5 mmol/L (ref 3.5–5.1)
Sodium: 140 mmol/L (ref 135–145)
Sodium: 140 mmol/L (ref 135–145)

## 2017-04-10 LAB — TROPONIN I
Troponin I: 0.03 ng/mL (ref <0.03)
Troponin I: 1.03 ng/mL (ref <0.03)
Troponin I: 2.76 ng/mL (ref <0.03)
Troponin I: 2.81 ng/mL (ref <0.03)

## 2017-04-10 LAB — I-STAT TROPONIN, ED: Troponin i, poc: 0.03 ng/mL (ref 0.00–0.08)

## 2017-04-10 LAB — CBC
HCT: 49.4 % (ref 39.0–52.0)
Hemoglobin: 16.7 g/dL (ref 13.0–17.0)
MCH: 30.5 pg (ref 26.0–34.0)
MCHC: 33.8 g/dL (ref 30.0–36.0)
MCV: 90.3 fL (ref 78.0–100.0)
Platelets: 235 10*3/uL (ref 150–400)
RBC: 5.47 MIL/uL (ref 4.22–5.81)
RDW: 13.1 % (ref 11.5–15.5)
WBC: 6.9 10*3/uL (ref 4.0–10.5)

## 2017-04-10 LAB — ECHOCARDIOGRAM LIMITED
Height: 70 in
Weight: 3216 oz

## 2017-04-10 LAB — PROTIME-INR
INR: 1
Prothrombin Time: 13.1 seconds (ref 11.4–15.2)

## 2017-04-10 SURGERY — LEFT HEART CATH AND CORONARY ANGIOGRAPHY
Anesthesia: LOCAL

## 2017-04-10 MED ORDER — FAMOTIDINE 20 MG PO TABS
20.0000 mg | ORAL_TABLET | Freq: Two times a day (BID) | ORAL | Status: DC
  Administered 2017-04-10 – 2017-04-11 (×3): 20 mg via ORAL
  Filled 2017-04-10 (×3): qty 1

## 2017-04-10 MED ORDER — MIDAZOLAM HCL 2 MG/2ML IJ SOLN
INTRAMUSCULAR | Status: AC
  Filled 2017-04-10: qty 2

## 2017-04-10 MED ORDER — MIDAZOLAM HCL 2 MG/2ML IJ SOLN
INTRAMUSCULAR | Status: DC | PRN
  Administered 2017-04-10: 2 mg via INTRAVENOUS
  Administered 2017-04-10: 1 mg via INTRAVENOUS

## 2017-04-10 MED ORDER — SODIUM CHLORIDE 0.9 % IV BOLUS (SEPSIS)
500.0000 mL | Freq: Once | INTRAVENOUS | Status: AC
  Administered 2017-04-10: 500 mL via INTRAVENOUS

## 2017-04-10 MED ORDER — SODIUM CHLORIDE 0.9 % WEIGHT BASED INFUSION: 1.0000 mL/kg/h | INTRAVENOUS | Status: DC

## 2017-04-10 MED ORDER — MORPHINE SULFATE (PF) 4 MG/ML IV SOLN
4.0000 mg | Freq: Once | INTRAVENOUS | Status: AC
  Administered 2017-04-10: 4 mg via INTRAVENOUS
  Filled 2017-04-10: qty 1

## 2017-04-10 MED ORDER — LISINOPRIL 2.5 MG PO TABS
2.5000 mg | ORAL_TABLET | Freq: Every day | ORAL | Status: DC
  Administered 2017-04-10 – 2017-04-11 (×2): 2.5 mg via ORAL
  Filled 2017-04-10 (×2): qty 1

## 2017-04-10 MED ORDER — MORPHINE SULFATE (PF) 4 MG/ML IV SOLN
2.0000 mg | INTRAVENOUS | Status: DC | PRN
  Administered 2017-04-10: 4 mg via INTRAVENOUS
  Filled 2017-04-10: qty 1

## 2017-04-10 MED ORDER — HEPARIN SODIUM (PORCINE) 1000 UNIT/ML IJ SOLN
INTRAMUSCULAR | Status: DC | PRN
  Administered 2017-04-10: 5000 [IU] via INTRAVENOUS

## 2017-04-10 MED ORDER — IOPAMIDOL (ISOVUE-370) INJECTION 76%
INTRAVENOUS | Status: AC
  Filled 2017-04-10: qty 100

## 2017-04-10 MED ORDER — LIDOCAINE HCL 1 % IJ SOLN
INTRAMUSCULAR | Status: AC
  Filled 2017-04-10: qty 20

## 2017-04-10 MED ORDER — ACETAMINOPHEN 325 MG PO TABS: 650.0000 mg | ORAL_TABLET | ORAL | Status: DC | PRN

## 2017-04-10 MED ORDER — SODIUM CHLORIDE 0.9 % IV SOLN: 250.0000 mL | INTRAVENOUS | Status: DC | PRN

## 2017-04-10 MED ORDER — ZOLPIDEM TARTRATE 5 MG PO TABS
5.0000 mg | ORAL_TABLET | Freq: Once | ORAL | Status: DC
Start: 2017-04-10 — End: 2017-04-11

## 2017-04-10 MED ORDER — VERAPAMIL HCL 2.5 MG/ML IV SOLN
INTRAVENOUS | Status: DC | PRN
  Administered 2017-04-10: 11:00:00 via INTRA_ARTERIAL

## 2017-04-10 MED ORDER — SODIUM CHLORIDE 0.9% FLUSH
3.0000 mL | Freq: Two times a day (BID) | INTRAVENOUS | Status: DC
  Administered 2017-04-10: 3 mL via INTRAVENOUS

## 2017-04-10 MED ORDER — ATORVASTATIN CALCIUM 80 MG PO TABS
80.0000 mg | ORAL_TABLET | Freq: Every day | ORAL | Status: DC
  Administered 2017-04-10: 80 mg via ORAL
  Filled 2017-04-10: qty 1

## 2017-04-10 MED ORDER — FENTANYL CITRATE (PF) 100 MCG/2ML IJ SOLN
INTRAMUSCULAR | Status: DC | PRN
  Administered 2017-04-10 (×2): 25 ug via INTRAVENOUS

## 2017-04-10 MED ORDER — ISOSORBIDE MONONITRATE ER 30 MG PO TB24
15.0000 mg | ORAL_TABLET | Freq: Every day | ORAL | Status: DC
  Administered 2017-04-10 – 2017-04-11 (×2): 15 mg via ORAL
  Filled 2017-04-10 (×2): qty 1

## 2017-04-10 MED ORDER — HEPARIN SODIUM (PORCINE) 1000 UNIT/ML IJ SOLN
INTRAMUSCULAR | Status: AC
  Filled 2017-04-10: qty 1

## 2017-04-10 MED ORDER — ALPRAZOLAM 0.25 MG PO TABS
0.2500 mg | ORAL_TABLET | Freq: Two times a day (BID) | ORAL | Status: DC | PRN
  Administered 2017-04-10 – 2017-04-11 (×4): 0.25 mg via ORAL
  Filled 2017-04-10 (×4): qty 1

## 2017-04-10 MED ORDER — METOPROLOL SUCCINATE ER 25 MG PO TB24
25.0000 mg | ORAL_TABLET | Freq: Every day | ORAL | Status: DC
  Administered 2017-04-10 – 2017-04-11 (×2): 25 mg via ORAL
  Filled 2017-04-10 (×2): qty 1

## 2017-04-10 MED ORDER — LIDOCAINE HCL (PF) 1 % IJ SOLN
INTRAMUSCULAR | Status: DC | PRN
  Administered 2017-04-10: 2 mL

## 2017-04-10 MED ORDER — ENOXAPARIN SODIUM 40 MG/0.4ML ~~LOC~~ SOLN: 40.0000 mg | SUBCUTANEOUS | Status: DC

## 2017-04-10 MED ORDER — SODIUM CHLORIDE 0.9% FLUSH: 3.0000 mL | Freq: Two times a day (BID) | INTRAVENOUS | Status: DC

## 2017-04-10 MED ORDER — TICAGRELOR 90 MG PO TABS
90.0000 mg | ORAL_TABLET | Freq: Two times a day (BID) | ORAL | Status: DC
  Administered 2017-04-10 – 2017-04-11 (×3): 90 mg via ORAL
  Filled 2017-04-10 (×3): qty 1

## 2017-04-10 MED ORDER — ONDANSETRON HCL 4 MG/2ML IJ SOLN: 4.0000 mg | Freq: Four times a day (QID) | INTRAMUSCULAR | Status: DC | PRN

## 2017-04-10 MED ORDER — SODIUM CHLORIDE 0.9 % WEIGHT BASED INFUSION
3.0000 mL/kg/h | INTRAVENOUS | Status: DC
  Administered 2017-04-10: 3 mL/kg/h via INTRAVENOUS

## 2017-04-10 MED ORDER — SODIUM CHLORIDE 0.9% FLUSH: 3.0000 mL | INTRAVENOUS | Status: DC | PRN

## 2017-04-10 MED ORDER — ALPRAZOLAM 0.25 MG PO TABS: 0.2500 mg | ORAL_TABLET | Freq: Once | ORAL | Status: DC

## 2017-04-10 MED ORDER — ALUM & MAG HYDROXIDE-SIMETH 200-200-20 MG/5ML PO SUSP: 30.0000 mL | ORAL | Status: DC | PRN

## 2017-04-10 MED ORDER — ASPIRIN 81 MG PO CHEW: 81.0000 mg | CHEWABLE_TABLET | ORAL | Status: DC

## 2017-04-10 MED ORDER — FENTANYL CITRATE (PF) 100 MCG/2ML IJ SOLN
INTRAMUSCULAR | Status: AC
  Filled 2017-04-10: qty 2

## 2017-04-10 MED ORDER — NITROGLYCERIN 0.4 MG SL SUBL: 0.4000 mg | SUBLINGUAL_TABLET | SUBLINGUAL | Status: DC | PRN

## 2017-04-10 MED ORDER — ASPIRIN 81 MG PO CHEW
81.0000 mg | CHEWABLE_TABLET | Freq: Every day | ORAL | Status: DC
  Administered 2017-04-10 – 2017-04-11 (×2): 81 mg via ORAL
  Filled 2017-04-10 (×2): qty 1

## 2017-04-10 MED ORDER — SODIUM CHLORIDE 0.9 % WEIGHT BASED INFUSION: 1.0000 mL/kg/h | INTRAVENOUS | Status: AC

## 2017-04-10 MED ORDER — IOPAMIDOL (ISOVUE-370) INJECTION 76%
INTRAVENOUS | Status: DC | PRN
  Administered 2017-04-10: 65 mL via INTRA_ARTERIAL

## 2017-04-10 MED ORDER — VERAPAMIL HCL 2.5 MG/ML IV SOLN
INTRAVENOUS | Status: AC
  Filled 2017-04-10: qty 2

## 2017-04-10 MED ORDER — HEPARIN (PORCINE) IN NACL 2-0.9 UNIT/ML-% IJ SOLN
INTRAMUSCULAR | Status: AC
  Filled 2017-04-10: qty 1000

## 2017-04-10 SURGICAL SUPPLY — 11 items
CATH 5FR JL3.5 JR4 ANG PIG MP (CATHETERS) ×2 IMPLANT
CATH LAUNCHER 5F RADR (CATHETERS) ×1 IMPLANT
CATHETER LAUNCHER 5F RADR (CATHETERS) ×2
DEVICE RAD COMP TR BAND LRG (VASCULAR PRODUCTS) ×2 IMPLANT
GLIDESHEATH SLEND SS 6F .021 (SHEATH) ×2 IMPLANT
GUIDEWIRE INQWIRE 1.5J.035X260 (WIRE) ×1 IMPLANT
INQWIRE 1.5J .035X260CM (WIRE) ×2
KIT HEART LEFT (KITS) ×2 IMPLANT
PACK CARDIAC CATHETERIZATION (CUSTOM PROCEDURE TRAY) ×2 IMPLANT
TRANSDUCER W/STOPCOCK (MISCELLANEOUS) ×2 IMPLANT
TUBING CIL FLEX 10 FLL-RA (TUBING) ×2 IMPLANT

## 2017-04-10 NOTE — ED Provider Notes (Signed)
[name] Provider Note   CSN: [id number] Arrival date & time: 04/10/17  0246     History   Chief Complaint Chief Complaint  Patient presents with  . Chest Pain    HPI James [name] is a 57 y.o. male.  The history is provided by the patient and medical records.  Chest Pain      57 y.o. M with hx of recent NSTEMI with proximal LAD lesion s/p PCI via DES with no residual stenosis, CHF with estimated EF 45-50%, GERD, hep C, HLP, presenting to the ED for chest pain.  Patient states he has been feeling fine lately, no real issues.  He was seen here 2 nights ago for palpitations and sensation of throat tightening but all tests were normal and he was allowed to go home.  States yesterday he was fine, went to bed as normal but woke up around 2AM with chest pain.  States pain throughout the chest, feeling tightness in the neck, pain/heaviness in the left arm.  States he did take some NTG PTA without relief.  He also took some maalox thinking it was indigestion as he did eat some pudding right before bed.  He has had some belching but it is not helping the pain very much.  States symptoms tonight are very similar to when he had his NSTEMI.  Has been compliant with all meds, following recommended diet.   Past Medical History:  Diagnosis Date  . Acute systolic congestive heart failure ([facility])   . Anemia    slight anemia with hepatitis tx  . Arthritis    oa  . Cancer ([facility]) 4 months ago   basal cell skin cancer removed from chest area  . GERD (gastroesophageal reflux disease)   . Hepatitis C 04/29/2012   had savalta treatment 2 years ago, now cured  . Hyperlipidemia   . NSTEMI (non-ST elevated myocardial infarction) ([facility]) 03/29/2017   1/19 PCI/DESx1 to mLAD, EF 45%  . Palpitations    at times     Patient Active Problem List   Diagnosis Date Noted  . Acute systolic heart failure ([facility]) 04/01/2017  . Hyperlipidemia 04/01/2017  . Hypokalemia  04/01/2017  . NSVT (nonsustained ventricular tachycardia) ([facility]) 04/01/2017  . NSTEMI (non-ST elevated myocardial infarction) ([facility]) 03/29/2017  . Overweight (BMI 25.0-29.9) 01/12/2015  . 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 Hepatitis C 04/29/2012  . Polycythemia 08/22/2011    Past Surgical History:  Procedure Laterality Date  . ARTHROSCOPIC REPAIR ACL Right 15 years ago  . CORONARY STENT INTERVENTION N/A 03/29/2017   Procedure: CORONARY STENT INTERVENTION;  Surgeon: Martinique, Peter M, MD;  Location: [facility];  Service: Cardiovascular;  Laterality: N/A;  . CORONARY THROMBECTOMY N/A 03/29/2017   Procedure: Coronary Thrombectomy;  Surgeon: Martinique, Peter M, MD;  Location: [facility];  Service: Cardiovascular;  Laterality: N/A;  . LEFT HEART CATH AND CORONARY ANGIOGRAPHY N/A 03/29/2017   Procedure: LEFT HEART CATH AND CORONARY ANGIOGRAPHY;  Surgeon: Martinique, Peter M, MD;  Location: [facility];  Service: Cardiovascular;  Laterality: N/A;  . TOTAL HIP ARTHROPLASTY Right 01/11/2015   Procedure: RIGHT TOTAL HIP ARTHROPLASTY ANTERIOR APPROACH;  Surgeon: Paralee Cancel, MD;  Location: [facility];  Service: Orthopedics;  Laterality: Right;       Home Medications    Prior to Admission medications   Medication Sig Start Date End Date Taking? Authorizing Provider  ALPRAZolam (XANAX) 0.25 MG tablet Take 1 tablet (0.25  mg total) by mouth 2 (two) times daily as needed for anxiety. 04/08/17   Daleen Bo, MD  aspirin 81 MG tablet Take 1 tablet (81 mg total) by mouth daily. 03/31/17   Leanor Kail, PA  atorvastatin (LIPITOR) 80 MG tablet Take 1 tablet (80 mg total) by mouth daily at 6 PM. 04/09/17   Almyra Deforest, PA  colchicine 0.6 MG tablet Take 1 tablet (0.6 mg total) by mouth 2 (two) times daily as needed (gout flare up). 04/09/17   Almyra Deforest, PA  lisinopril (PRINIVIL,ZESTRIL) 2.5 MG tablet Take 1 tablet (2.5 mg total) by mouth daily. 04/09/17 11/05/17  Almyra Deforest, PA  metoprolol succinate  (TOPROL-XL) 25 MG 24 hr tablet Take 1 tablet (25 mg total) by mouth daily. 04/09/17   Almyra Deforest, PA  nitroGLYCERIN (NITROSTAT) 0.4 MG SL tablet Place 1 tablet (0.4 mg total) under the tongue every 5 (five) minutes as needed. 03/31/17   Leanor Kail, PA  ticagrelor (BRILINTA) 90 MG TABS tablet Take 1 tablet (90 mg total) by mouth 2 (two) times daily. 04/09/17   Almyra Deforest, PA  valACYclovir (VALTREX) 1000 MG tablet Take 1,000 mg by mouth daily as needed (for fever blisters). Reported on 09/22/2015 11/18/14   [provider]    Family History Family History  Problem Relation Age of Onset  . Alzheimer's disease Mother   . Heart failure Father   . CAD Father     Social History Social History   Tobacco Use  . Smoking status: Light Tobacco Smoker    Types: Cigars  . Smokeless tobacco: Never Used  . Tobacco comment: cigar  Substance Use Topics  . Alcohol use: No  . Drug use: No     Allergies   Patient has no known allergies.   Review of Systems Review of Systems  Cardiovascular: Positive for chest pain.  All other systems reviewed and are negative.    Physical Exam Updated Vital Signs BP 110/86 (BP Location: Right Arm)   Pulse 87   Temp 97.9 F (36.6 C) (Oral)   Resp 18   Ht 5\' 10"  (1.778 m)   Wt 90.7 kg (200 lb)   SpO2 96%   BMI 28.70 kg/m   Physical Exam  Constitutional: He is oriented to person, place, and time. He appears well-developed and well-nourished.  HENT:  Head: Normocephalic and atraumatic.  Mouth/Throat: Oropharynx is clear and moist.  Eyes: Conjunctivae and EOM are normal. Pupils are equal, round, and reactive to light.  Neck: Normal range of motion.  Cardiovascular: Normal rate, regular rhythm and normal heart sounds.  Pulmonary/Chest: Effort normal and breath sounds normal.  Abdominal: Soft. Bowel sounds are normal.  Musculoskeletal: Normal range of motion.  Neurological: He is alert and oriented to person, place, and time.  Skin: Skin  is warm and dry.  Psychiatric: His mood appears anxious.  Slightly anxious appearing  Nursing note and vitals reviewed.    ED Treatments / Results  Labs (all labs ordered are listed, but only abnormal results are displayed) Labs Reviewed  BASIC METABOLIC PANEL - Abnormal; Notable for the following components:      Result Value   Glucose, Bld 100 (*)    BUN 27 (*)    Creatinine, Ser 1.37 (*)    GFR calc non Af Amer 55 (*)    All other components within normal limits  TROPONIN I - Abnormal; Notable for the following components:   Troponin I 0.03 (*)    All other  components within normal limits  CBC  TROPONIN I  TROPONIN I  TROPONIN I  I-STAT TROPONIN, ED    EKG  EKG Interpretation None       Radiology Dg Chest 2 View  Result Date: 04/08/2017 CLINICAL DATA:  Chest pain, shortness of Breath EXAM: CHEST  2 VIEW COMPARISON:  03/29/2017 FINDINGS: Heart is upper limits normal in size. Lungs are clear. No effusions or acute bony abnormality. IMPRESSION: No active cardiopulmonary disease. Electronically Signed   By: Rolm Baptise M.D.   On: 04/08/2017 21:18    Procedures Procedures (including critical care time)  Medications Ordered in ED Medications  ALPRAZolam (XANAX) tablet 0.25 mg (not administered)  aspirin chewable tablet 81 mg (not administered)  atorvastatin (LIPITOR) tablet 80 mg (not administered)  lisinopril (PRINIVIL,ZESTRIL) tablet 2.5 mg (not administered)  metoprolol succinate (TOPROL-XL) 24 hr tablet 25 mg (not administered)  ticagrelor (BRILINTA) tablet 90 mg (not administered)  nitroGLYCERIN (NITROSTAT) SL tablet 0.4 mg (not administered)  acetaminophen (TYLENOL) tablet 650 mg (not administered)  ondansetron (ZOFRAN) injection 4 mg (not administered)  enoxaparin (LOVENOX) injection 40 mg (not administered)  morphine 4 MG/ML injection 2-4 mg (4 mg Intravenous Given 04/10/17 0519)  morphine 4 MG/ML injection 4 mg (4 mg Intravenous Given 04/10/17 0330)    sodium chloride 0.9 % bolus 500 mL (0 mLs Intravenous Stopped 04/10/17 0518)     Initial Impression / Assessment and Plan / ED Course  I have reviewed the triage vital signs and the nursing notes.  Pertinent labs & imaging results that were available during my care of the patient were reviewed by me and considered in my medical decision making (see chart for details).  57 year old male here with chest pain.  Recent NSTEMI s/p PCI to LAD.  States symptoms this evening were similar to his presenting symptoms 2 weeks ago.  No relieved with nitroglycerin.  Has been compliant with his medications since discharge.  EKG with some slight deepening of T wave inversions and anteriorly.  I-STAT troponin is negative, however lab troponin equivocal at 0.03.  Remainder of labs overall reassuring.  Chest x-ray is clear.  With patient has had resolution of pain with morphine.  Vitals remained stable.  Will discuss with cardiology.  4:14 AM Discussed with Dr. Aundra Dubin with cardiology-- recommends admission to medicine service given equivocal troponin.  If rising/acute changes, can call back for formal consultation.  Patient will be admitted to the medicine service for ongoing care, serial troponins.  Final Clinical Impressions(s) / ED Diagnoses   Final diagnoses:  Chest pain, unspecified type    ED Discharge Orders    None       Larene Pickett, PA-C 04/10/17 1610    Ezequiel Essex, MD 04/10/17 0930

## 2017-04-10 NOTE — H&P (View-Only) (Signed)
Cardiology Consultation:   Patient ID: James [name]; [id number]; [birth year]   Admit date: 04/10/2017 Date of Consult: 04/10/2017  Primary Care Provider: [name], MD Primary Cardiologist: [name], MD   Patient Profile:   James [name] is a 57 y.o. male with a hx of polycythemia, hepatitis C, basal cell skin CA and recent NSTEMI w/DES to Prox LAD on 03/29/17 who is being seen today for the evaluation of chest at the request of Dr. Tyrell Antonio.  History of Present Illness:   Mr. Culbreath was recently hospitalized 03/29/17 for Non-STEMI with troponin peak of 45.54. He went for urgent cardiac catheterization that revealed a 100% proximal LAD lesion that was treated with thrombectomy and 3.5 x 16 mm Synergy DES, EF 45%. He was placed on aspirin, Brilinta and high intensity statin. Imdur was discontinued due to headache. He was also given 1 month supply of Xanax for significant anxiety.   Mr. Curci was seen in the ED on 04/08/17 for complaints of tightness in the neck and palpitations. No significant findings were revealed except for PAC's on his EKG. He had a follow up appointment scheduled for the following day and he was discharged with Xanax.   Mr. Campas was having a good day yesterday, feeling well with no anxiety. At around 2:30 am he was awakened with tightness/pressure across his chest that radiated to his neck, left shoulder and left arm. He also had some mild lightheadedness and feeling "a little clammy". He had no shortness of breath, nausea or syncope. He did not notice palpitations. His symptoms were similar to his MI symptoms. He took 2 SL NTG without relief as his wife drove him to the ED. The pain lasted about 1 1/2 hrs and was relieved in the ED with morphine. He has not had any recurrent pressure but had a couple of brief sharp left sided pains which he has been having since his MI.   He reports compliance with all of his medications including Brilinta and aspirin.   Past  Medical History:  Diagnosis Date  . Acute systolic congestive heart failure ([place])   . Anemia    slight anemia with hepatitis tx  . Arthritis    oa  . Cancer ([place]) 4 months ago   basal cell skin cancer removed from chest area  . GERD (gastroesophageal reflux disease)   . Hepatitis C 04/29/2012   had savalta treatment 2 years ago, now cured  . Hyperlipidemia   . NSTEMI (non-ST elevated myocardial infarction) ([place]) 03/29/2017   1/19 PCI/DESx1 to mLAD, EF 45%  . Palpitations    at times     Past Surgical History:  Procedure Laterality Date  . ARTHROSCOPIC REPAIR ACL Right 15 years ago  . CORONARY STENT INTERVENTION N/A 03/29/2017   Procedure: CORONARY STENT INTERVENTION;  Surgeon: Martinique, Peter M, MD;  Location: [facility];  Service: Cardiovascular;  Laterality: N/A;  . CORONARY THROMBECTOMY N/A 03/29/2017   Procedure: Coronary Thrombectomy;  Surgeon: Martinique, Peter M, MD;  Location: [facility];  Service: Cardiovascular;  Laterality: N/A;  . LEFT HEART CATH AND CORONARY ANGIOGRAPHY N/A 03/29/2017   Procedure: LEFT HEART CATH AND CORONARY ANGIOGRAPHY;  Surgeon: Martinique, Peter M, MD;  Location: [facility] [facility];  Service: Cardiovascular;  Laterality: N/A;  . TOTAL HIP ARTHROPLASTY Right 01/11/2015   Procedure: RIGHT TOTAL HIP ARTHROPLASTY ANTERIOR APPROACH;  Surgeon: Paralee Cancel, MD;  Location: [facility];  Service: Orthopedics;  Laterality: Right;     Home  Medications:  Prior to Admission medications   Medication Sig Start Date End Date Taking? Authorizing Provider  ALPRAZolam (XANAX) 0.25 MG tablet Take 1 tablet (0.25 mg total) by mouth 2 (two) times daily as needed for anxiety. 04/08/17  Yes Daleen Bo, MD  aspirin 81 MG tablet Take 1 tablet (81 mg total) by mouth daily. 03/31/17  Yes Bhagat, Bhavinkumar, PA  atorvastatin (LIPITOR) 80 MG tablet Take 1 tablet (80 mg total) by mouth daily at 6 PM. 04/09/17  Yes Almyra Deforest, PA  colchicine 0.6 MG tablet Take 1 tablet (0.6 mg  total) by mouth 2 (two) times daily as needed (gout flare up). 04/09/17  Yes Almyra Deforest, PA  lisinopril (PRINIVIL,ZESTRIL) 2.5 MG tablet Take 1 tablet (2.5 mg total) by mouth daily. 04/09/17 11/05/17 Yes Almyra Deforest, PA  metoprolol succinate (TOPROL-XL) 25 MG 24 hr tablet Take 1 tablet (25 mg total) by mouth daily. 04/09/17  Yes Almyra Deforest, PA  nitroGLYCERIN (NITROSTAT) 0.4 MG SL tablet Place 1 tablet (0.4 mg total) under the tongue every 5 (five) minutes as needed. 03/31/17  Yes Bhagat, Bhavinkumar, PA  ticagrelor (BRILINTA) 90 MG TABS tablet Take 1 tablet (90 mg total) by mouth 2 (two) times daily. 04/09/17  Yes Almyra Deforest, PA  valACYclovir (VALTREX) 1000 MG tablet Take 1,000 mg by mouth daily as needed (for fever blisters). Reported on 09/22/2015 11/18/14  Yes [provider]    Inpatient Medications: Scheduled Meds: . aspirin  81 mg Oral Daily  . atorvastatin  80 mg Oral q1800  . enoxaparin (LOVENOX) injection  40 mg Subcutaneous Q24H  . lisinopril  2.5 mg Oral Daily  . metoprolol succinate  25 mg Oral Daily  . ticagrelor  90 mg Oral BID   Continuous Infusions:  PRN Meds: acetaminophen, ALPRAZolam, alum & mag hydroxide-simeth, morphine injection, nitroGLYCERIN, ondansetron (ZOFRAN) IV  Allergies:   No Known Allergies  Social History:   Social History   Socioeconomic History  . Marital status: Single    Spouse name: Not on file  . Number of children: Not on file  . Years of education: Not on file  . Highest education level: Not on file  Social Needs  . Financial resource strain: Not on file  . Food insecurity - worry: Not on file  . Food insecurity - inability: Not on file  . Transportation needs - medical: Not on file  . Transportation needs - non-medical: Not on file  Occupational History  . Occupation:  Financial controller: [employer]  Tobacco Use  . Smoking status: Light Tobacco Smoker    Types: Cigars  . Smokeless tobacco: Never Used  . Tobacco  comment: cigar  Substance and Sexual Activity  . Alcohol use: No  . Drug use: No  . Sexual activity: Not on file  Other Topics Concern  . Not on file  Social History Narrative  . Not on file    Family History:    Family History  Problem Relation Age of Onset  . Alzheimer's disease Mother   . Heart failure Father   . CAD Father      ROS:  Please see the history of present illness.   All other ROS reviewed and negative.     Physical Exam/Data:   Vitals:   04/10/17 0445 04/10/17 0500 04/10/17 0515 04/10/17 0544  BP: (!) 125/97 120/80 109/80 126/75  Pulse: 75 66 73 68  Resp: 18 18 20 18   Temp:  97.9 F (36.6 C)  TempSrc:    Axillary  SpO2: 93% 93% 97% 94%  Weight:    201 lb (91.2 kg)  Height:    5\' 10"  (1.778 m)    Intake/Output Summary (Last 24 hours) at 04/10/2017 0743 Last data filed at 04/10/2017 0600 Gross per 24 hour  Intake 600 ml  Output -  Net 600 ml   Filed Weights   04/10/17 0256 04/10/17 0544  Weight: 200 lb (90.7 kg) 201 lb (91.2 kg)   Body mass index is 28.84 kg/m.  General:  Well nourished, well developed, in no acute distress HEENT: normal Lymph: no adenopathy Neck: no JVD Endocrine:  No thryomegaly Vascular: No carotid bruits; FA pulses 2+ bilaterally without bruits  Cardiac:  normal S1, S2; RRR; no murmur  Lungs:  clear to auscultation bilaterally, no wheezing, rhonchi or rales  Abd: soft, nontender, no hepatomegaly  Ext: no edema Musculoskeletal:  No deformities, BUE and BLE strength normal and equal Skin: warm and dry  Neuro:  CNs 2-12 intact, no focal abnormalities noted Psych:  Normal affect   EKG:  The EKG was personally reviewed and demonstrates:  NSR at 72 bpm, diffuse T wave inversions in precordial leads Telemetry:  Telemetry was personally reviewed and demonstrates:  Sinus rhythm with frequent PAC's.   Relevant CV Studies:  Cath 03/29/2017 Conclusion     Prox LAD lesion is 100% stenosed.  Post intervention, there  is a 0% residual stenosis.  A drug-eluting stent was successfully placed using a STENT SYNERGY DES 3.5X16.  The left ventricular systolic function is normal.  LV end diastolic pressure is normal.  The left ventricular ejection fraction is 45-50% by visual estimate.  1. Single vessel occlusive CAD- 100% mid LAD with collaterals 2. Mild LV dysfunction. EF 45% 3. Normal LVEDP 4. Successful aspiration thrombectomy and stenting of the mid LAD with DES   Plan: DAPT with ASA and Brilinta. IV Aggrastat for 18 hours. May be a candidate for DC Sunday if no complications. High dose statin.    Echo 03/30/2017 LV EF: 40% - 45% Study Conclusions - Left ventricle: Septal and apical akinesis inferior wall hypokinesis The cavity size was mildly dilated. Wall thickness was increased in a pattern of mild LVH. Systolic function was mildly to moderately reduced. The estimated ejection fraction was in the range of 40% to 45%. Left ventricular diastolic function parameters were normal. - Atrial septum: No defect or patent foramen ovale was identified.   Laboratory Data:  Chemistry Recent Labs  Lab 04/08/17 1931 04/10/17 0253  NA 136 140  K 3.9 3.7  CL 103 103  CO2 22 26  GLUCOSE 98 100*  BUN 26* 27*  CREATININE 1.20 1.37*  CALCIUM 9.4 9.4  GFRNONAA >60 55*  GFRAA >60 >60  ANIONGAP 11 11    No results for input(s): PROT, ALBUMIN, AST, ALT, ALKPHOS, BILITOT in the last 168 hours. Hematology Recent Labs  Lab 04/08/17 1931 04/10/17 0253  WBC 9.1 6.9  RBC 5.52 5.47  HGB 16.7 16.7  HCT 49.7 49.4  MCV 90.0 90.3  MCH 30.3 30.5  MCHC 33.6 33.8  RDW 13.4 13.1  PLT 257 235   Cardiac Enzymes Recent Labs  Lab 04/10/17 0307  TROPONINI 0.03*    Recent Labs  Lab 04/08/17 1942 04/10/17 0256  TROPIPOC 0.06 0.03    BNPNo results for input(s): BNP, PROBNP in the last 168 hours.  DDimer No results for input(s): DDIMER in the last 168 hours.  Radiology/Studies:  Dg  Chest 2 View  Result Date: 04/08/2017 CLINICAL DATA:  Chest pain, shortness of Breath EXAM: CHEST  2 VIEW COMPARISON:  03/29/2017 FINDINGS: Heart is upper limits normal in size. Lungs are clear. No effusions or acute bony abnormality. IMPRESSION: No active cardiopulmonary disease. Electronically Signed   By: Rolm Baptise M.D.   On: 04/08/2017 21:18   Dg Chest Portable 1 View  Result Date: 04/10/2017 CLINICAL DATA:  Central chest pain radiating to the neck and left arm. Lightheadedness. EXAM: PORTABLE CHEST 1 VIEW COMPARISON:  04/08/2017 FINDINGS: Shallow inspiration. Normal heart size and pulmonary vascularity. No focal airspace disease or consolidation in the lungs. No blunting of costophrenic angles. No pneumothorax. Mediastinal contours appear intact. IMPRESSION: No active disease. Electronically Signed   By: Lucienne Capers M.D.   On: 04/10/2017 03:33    Assessment and Plan:   Chest pain -Recent NSTEMI 03/29/17 with troponin peak of 45.54. He went for urgent cardiac catheterization that revealed a 100% proximal LAD lesion that was treated with thrombectomy and 3.5 x 16 mm Synergy DES, EF 45%. -He is treated with aspirin, Brilinta, high intensity statin, ACE-I. Reports compliance with all meds.  -Presented with 1 episode of tightness in the neck 2 days ago, last night awakened with tightness/pressure in the chest that radiated to the neck, left shoulder and let arm, similar to his symptoms with his MI. Not relieved with NTG SL, relieved with morphine. Duration of symptoms about 1.5 hrs. -POC troponin 0.03, lab troponin 0.03 at 03:07, this am troponin up to 1.03 -EKG with diffuse T wave inversions in precordial leads may be related to healing MI, but has progressed since previous EKG on 1/28.  -Hgb and electrolytes normal, SCr slightly elevated -CXR shows no active disease -Currently chest pain free.  -possibility of pericarditis, but his pain is not constant and pt felt that was similar to his  MI and had 2 distinct episodes.  -With symptoms reminiscent of his recent MI, rising troponin, EKG changes, will start heparin and consider relook cath. Dr. Sallyanne Kuster notified and will come see pt. Will keep NPO.  -Discussed with Dr. Sallyanne Kuster. Plan for cath this am. Orders placed.   Anxiety -Continuing on Xanax  CKD stage 2 -serum creatinine is 1.37, baseline 1-1.2 -Treated with 500cc NS in ED -Stat BMet ordered to recheck creatinine.   For questions or updates, please contact [facility] Please consult [email or web address] for contact info under Cardiology/STEMI.   Signed, Daune Perch, NP  04/10/2017 7:43 AM   I have seen and examined the patient along with Daune Perch, NP.  I have reviewed the chart, notes and new data.  I agree with NP's note.  Key new complaints: mild nagging discomfort at apex which has been present since the initial MI and PCI. Reports compliance with DAPT. Key examination changes: no overt HF, no arrhythmia, normal CV exam Key new findings / data: creat 1.37 is only marginally higher than baseline (also not bad considering his body habitus). Trop 1.0 (and rising),. Marked changes on ECG c/w new ischemia in LAD artery distribution.  PLAN: Post-infarction angina, ECG, enzymes all suggest recurrent proximal LAD distribution ischemia. Needs repeat coronary angio and possible PCI. Doubt this represents focal pericarditis or coronary vasospasm. This procedure has been fully reviewed with the patient and written informed consent has been obtained. Will start heparin IV while we wait for the cath.  Sanda Klein, MD, [place] [phone] 04/10/2017, 9:10 AM

## 2017-04-10 NOTE — Progress Notes (Signed)
*  PRELIMINARY RESULTS* Echocardiogram 2D Echocardiogram LIMITED has been performed.  Leavy Cella 04/10/2017, 3:02 PM

## 2017-04-10 NOTE — ED Notes (Signed)
Dr. Wyvonnia Dusky notified on pt.'s elevated Troponin result.

## 2017-04-10 NOTE — ED Triage Notes (Signed)
Pt reports waking up this AM with central CP, radiating to neck and down L arm 7/10, sharp in nature. Pt also reports ligthheadedness. Pt seen 1/18 for NSTEMI and has cardiac cath

## 2017-04-10 NOTE — Progress Notes (Signed)
Patient with complaints of increased chest pain while lying flat. Patient placed at 40 degree angle with good relief. PRN maalox ordered. c/o of severe anxiety, xanax administered. No chest pain post morphine administration. Resting in bed on room air, wife at bedside.

## 2017-04-10 NOTE — Interval H&P Note (Signed)
History and Physical Interval Note:  04/10/2017 10:18 AM  James [name]  has presented today for surgery, with the diagnosis of cp  The various methods of treatment have been discussed with the patient and family. After consideration of risks, benefits and other options for treatment, the patient has consented to  Procedure(s): LEFT HEART CATH AND CORONARY ANGIOGRAPHY (N/A) as a surgical intervention .  The patient's history has been reviewed, patient examined, no change in status, stable for surgery.  I have reviewed the patient's chart and labs.  Questions were answered to the patient's satisfaction.   Cath Lab Visit (complete for each Cath Lab visit)  Clinical Evaluation Leading to the Procedure:   ACS: Yes.    Non-ACS:    Anginal Classification: CCS IV  Anti-ischemic medical therapy: Minimal Therapy (1 class of medications)  Non-Invasive Test Results: No non-invasive testing performed  Prior CABG: No previous CABG       James [name] [facility] 04/10/2017 10:18 AM

## 2017-04-10 NOTE — Consult Note (Signed)
Cardiology Consultation:   Patient ID: James [name]; [id number]; 1961/01/21   Admit date: 04/10/2017 Date of Consult: 04/10/2017  Primary Care Provider: [name], MD Primary Cardiologist: [name], MD   Patient Profile:   James [name] is a 57 y.o. male with a hx of polycythemia, hepatitis C, basal cell skin CA and recent NSTEMI w/DES to Prox LAD on 03/29/17 who is being seen today for the evaluation of chest at the request of Dr. Tyrell Antonio.  History of Present Illness:   James [name] was recently hospitalized 03/29/17 for Non-STEMI with troponin peak of 45.54. He went for urgent cardiac catheterization that revealed a 100% proximal LAD lesion that was treated with thrombectomy and 3.5 x 16 mm Synergy DES, EF 45%. He was placed on aspirin, Brilinta and high intensity statin. Imdur was discontinued due to headache. He was also given 1 month supply of Xanax for significant anxiety.   James [name] was seen in the ED on 04/08/17 for complaints of tightness in the neck and palpitations. No significant findings were revealed except for PAC's on his EKG. He had a follow up appointment scheduled for the following day and he was discharged with Xanax.   James [name] was having a good day yesterday, feeling well with no anxiety. At around 2:30 am he was awakened with tightness/pressure across his chest that radiated to his neck, left shoulder and left arm. He also had some mild lightheadedness and feeling "a little clammy". He had no shortness of breath, nausea or syncope. He did not notice palpitations. His symptoms were similar to his MI symptoms. He took 2 SL NTG without relief as his wife drove him to the ED. The pain lasted about 1 1/2 hrs and was relieved in the ED with morphine. He has not had any recurrent pressure but had a couple of brief sharp left sided pains which he has been having since his MI.   He reports compliance with all of his medications including Brilinta and aspirin.   Past  Medical History:  Diagnosis Date  . Acute systolic congestive heart failure ([place])   . Anemia    slight anemia with hepatitis tx  . Arthritis    oa  . Cancer ([place]) 4 months ago   basal cell skin cancer removed from chest area  . GERD (gastroesophageal reflux disease)   . Hepatitis C 04/29/2012   had savalta treatment 2 years ago, now cured  . Hyperlipidemia   . NSTEMI (non-ST elevated myocardial infarction) ([place]) 03/29/2017   1/19 PCI/DESx1 to mLAD, EF 45%  . Palpitations    at times     Past Surgical History:  Procedure Laterality Date  . ARTHROSCOPIC REPAIR ACL Right 15 years ago  . CORONARY STENT INTERVENTION N/A 03/29/2017   Procedure: CORONARY STENT INTERVENTION;  Surgeon: Martinique, Peter M, MD;  Location: [place] [facility];  Service: Cardiovascular;  Laterality: N/A;  . CORONARY THROMBECTOMY N/A 03/29/2017   Procedure: Coronary Thrombectomy;  Surgeon: Martinique, Peter M, MD;  Location: [facility];  Service: Cardiovascular;  Laterality: N/A;  . LEFT HEART CATH AND CORONARY ANGIOGRAPHY N/A 03/29/2017   Procedure: LEFT HEART CATH AND CORONARY ANGIOGRAPHY;  Surgeon: Martinique, Peter M, MD;  Location: [facility];  Service: Cardiovascular;  Laterality: N/A;  . TOTAL HIP ARTHROPLASTY Right 01/11/2015   Procedure: RIGHT TOTAL HIP ARTHROPLASTY ANTERIOR APPROACH;  Surgeon: Paralee Cancel, MD;  Location: [facility];  Service: Orthopedics;  Laterality: Right;     Home  Medications:  Prior to Admission medications   Medication Sig Start Date End Date Taking? Authorizing Provider  ALPRAZolam (XANAX) 0.25 MG tablet Take 1 tablet (0.25 mg total) by mouth 2 (two) times daily as needed for anxiety. 04/08/17  Yes Daleen Bo, MD  aspirin 81 MG tablet Take 1 tablet (81 mg total) by mouth daily. 03/31/17  Yes Bhagat, Bhavinkumar, PA  atorvastatin (LIPITOR) 80 MG tablet Take 1 tablet (80 mg total) by mouth daily at 6 PM. 04/09/17  Yes Almyra Deforest, PA  colchicine 0.6 MG tablet Take 1 tablet (0.6 mg  total) by mouth 2 (two) times daily as needed (gout flare up). 04/09/17  Yes Almyra Deforest, PA  lisinopril (PRINIVIL,ZESTRIL) 2.5 MG tablet Take 1 tablet (2.5 mg total) by mouth daily. 04/09/17 11/05/17 Yes Almyra Deforest, PA  metoprolol succinate (TOPROL-XL) 25 MG 24 hr tablet Take 1 tablet (25 mg total) by mouth daily. 04/09/17  Yes Almyra Deforest, PA  nitroGLYCERIN (NITROSTAT) 0.4 MG SL tablet Place 1 tablet (0.4 mg total) under the tongue every 5 (five) minutes as needed. 03/31/17  Yes Bhagat, Bhavinkumar, PA  ticagrelor (BRILINTA) 90 MG TABS tablet Take 1 tablet (90 mg total) by mouth 2 (two) times daily. 04/09/17  Yes Almyra Deforest, PA  valACYclovir (VALTREX) 1000 MG tablet Take 1,000 mg by mouth daily as needed (for fever blisters). Reported on 09/22/2015 11/18/14  Yes [provider]    Inpatient Medications: Scheduled Meds: . aspirin  81 mg Oral Daily  . atorvastatin  80 mg Oral q1800  . enoxaparin (LOVENOX) injection  40 mg Subcutaneous Q24H  . lisinopril  2.5 mg Oral Daily  . metoprolol succinate  25 mg Oral Daily  . ticagrelor  90 mg Oral BID   Continuous Infusions:  PRN Meds: acetaminophen, ALPRAZolam, alum & mag hydroxide-simeth, morphine injection, nitroGLYCERIN, ondansetron (ZOFRAN) IV  Allergies:   No Known Allergies  Social History:   Social History   Socioeconomic History  . Marital status: Single    Spouse name: Not on file  . Number of children: Not on file  . Years of education: Not on file  . Highest education level: Not on file  Social Needs  . Financial resource strain: Not on file  . Food insecurity - worry: Not on file  . Food insecurity - inability: Not on file  . Transportation needs - medical: Not on file  . Transportation needs - non-medical: Not on file  Occupational History  . Occupation:  Financial controller: [employer]  Tobacco Use  . Smoking status: Light Tobacco Smoker    Types: Cigars  . Smokeless tobacco: Never Used  . Tobacco  comment: cigar  Substance and Sexual Activity  . Alcohol use: No  . Drug use: No  . Sexual activity: Not on file  Other Topics Concern  . Not on file  Social History Narrative  . Not on file    Family History:    Family History  Problem Relation Age of Onset  . Alzheimer's disease Mother   . Heart failure Father   . CAD Father      ROS:  Please see the history of present illness.   All other ROS reviewed and negative.     Physical Exam/Data:   Vitals:   04/10/17 0445 04/10/17 0500 04/10/17 0515 04/10/17 0544  BP: (!) 125/97 120/80 109/80 126/75  Pulse: 75 66 73 68  Resp: 18 18 20 18   Temp:  97.9 F (36.6 C)  TempSrc:    Axillary  SpO2: 93% 93% 97% 94%  Weight:    201 lb (91.2 kg)  Height:    5\' 10"  (1.778 m)    Intake/Output Summary (Last 24 hours) at 04/10/2017 0743 Last data filed at 04/10/2017 0600 Gross per 24 hour  Intake 600 ml  Output -  Net 600 ml   Filed Weights   04/10/17 0256 04/10/17 0544  Weight: 200 lb (90.7 kg) 201 lb (91.2 kg)   Body mass index is 28.84 kg/m.  General:  Well nourished, well developed, in no acute distress HEENT: normal Lymph: no adenopathy Neck: no JVD Endocrine:  No thryomegaly Vascular: No carotid bruits; FA pulses 2+ bilaterally without bruits  Cardiac:  normal S1, S2; RRR; no murmur  Lungs:  clear to auscultation bilaterally, no wheezing, rhonchi or rales  Abd: soft, nontender, no hepatomegaly  Ext: no edema Musculoskeletal:  No deformities, BUE and BLE strength normal and equal Skin: warm and dry  Neuro:  CNs 2-12 intact, no focal abnormalities noted Psych:  Normal affect   EKG:  The EKG was personally reviewed and demonstrates:  NSR at 72 bpm, diffuse T wave inversions in precordial leads Telemetry:  Telemetry was personally reviewed and demonstrates:  Sinus rhythm with frequent PAC's.   Relevant CV Studies:  Cath 03/29/2017 Conclusion     Prox LAD lesion is 100% stenosed.  Post intervention, there  is a 0% residual stenosis.  A drug-eluting stent was successfully placed using a STENT SYNERGY DES 3.5X16.  The left ventricular systolic function is normal.  LV end diastolic pressure is normal.  The left ventricular ejection fraction is 45-50% by visual estimate.  1. Single vessel occlusive CAD- 100% mid LAD with collaterals 2. Mild LV dysfunction. EF 45% 3. Normal LVEDP 4. Successful aspiration thrombectomy and stenting of the mid LAD with DES   Plan: DAPT with ASA and Brilinta. IV Aggrastat for 18 hours. May be a candidate for DC Sunday if no complications. High dose statin.    Echo 03/30/2017 LV EF: 40% - 45% Study Conclusions - Left ventricle: Septal and apical akinesis inferior wall hypokinesis The cavity size was mildly dilated. Wall thickness was increased in a pattern of mild LVH. Systolic function was mildly to moderately reduced. The estimated ejection fraction was in the range of 40% to 45%. Left ventricular diastolic function parameters were normal. - Atrial septum: No defect or patent foramen ovale was identified.   Laboratory Data:  Chemistry Recent Labs  Lab 04/08/17 1931 04/10/17 0253  NA 136 140  K 3.9 3.7  CL 103 103  CO2 22 26  GLUCOSE 98 100*  BUN 26* 27*  CREATININE 1.20 1.37*  CALCIUM 9.4 9.4  GFRNONAA >60 55*  GFRAA >60 >60  ANIONGAP 11 11    No results for input(s): PROT, ALBUMIN, AST, ALT, ALKPHOS, BILITOT in the last 168 hours. Hematology Recent Labs  Lab 04/08/17 1931 04/10/17 0253  WBC 9.1 6.9  RBC 5.52 5.47  HGB 16.7 16.7  HCT 49.7 49.4  MCV 90.0 90.3  MCH 30.3 30.5  MCHC 33.6 33.8  RDW 13.4 13.1  PLT 257 235   Cardiac Enzymes Recent Labs  Lab 04/10/17 0307  TROPONINI 0.03*    Recent Labs  Lab 04/08/17 1942 04/10/17 0256  TROPIPOC 0.06 0.03    BNPNo results for input(s): BNP, PROBNP in the last 168 hours.  DDimer No results for input(s): DDIMER in the last 168 hours.  Radiology/Studies:  Dg  Chest 2 View  Result Date: 04/08/2017 CLINICAL DATA:  Chest pain, shortness of Breath EXAM: CHEST  2 VIEW COMPARISON:  03/29/2017 FINDINGS: Heart is upper limits normal in size. Lungs are clear. No effusions or acute bony abnormality. IMPRESSION: No active cardiopulmonary disease. Electronically Signed   By: Rolm Baptise M.D.   On: 04/08/2017 21:18   Dg Chest Portable 1 View  Result Date: 04/10/2017 CLINICAL DATA:  Central chest pain radiating to the neck and left arm. Lightheadedness. EXAM: PORTABLE CHEST 1 VIEW COMPARISON:  04/08/2017 FINDINGS: Shallow inspiration. Normal heart size and pulmonary vascularity. No focal airspace disease or consolidation in the lungs. No blunting of costophrenic angles. No pneumothorax. Mediastinal contours appear intact. IMPRESSION: No active disease. Electronically Signed   By: Lucienne Capers M.D.   On: 04/10/2017 03:33    Assessment and Plan:   Chest pain -Recent NSTEMI 03/29/17 with troponin peak of 45.54. He went for urgent cardiac catheterization that revealed a 100% proximal LAD lesion that was treated with thrombectomy and 3.5 x 16 mm Synergy DES, EF 45%. -He is treated with aspirin, Brilinta, high intensity statin, ACE-I. Reports compliance with all meds.  -Presented with 1 episode of tightness in the neck 2 days ago, last night awakened with tightness/pressure in the chest that radiated to the neck, left shoulder and let arm, similar to his symptoms with his MI. Not relieved with NTG SL, relieved with morphine. Duration of symptoms about 1.5 hrs. -POC troponin 0.03, lab troponin 0.03 at 03:07, this am troponin up to 1.03 -EKG with diffuse T wave inversions in precordial leads may be related to healing MI, but has progressed since previous EKG on 1/28.  -Hgb and electrolytes normal, SCr slightly elevated -CXR shows no active disease -Currently chest pain free.  -possibility of pericarditis, but his pain is not constant and pt felt that was similar to his  MI and had 2 distinct episodes.  -With symptoms reminiscent of his recent MI, rising troponin, EKG changes, will start heparin and consider relook cath. Dr. Sallyanne Kuster notified and will come see pt. Will keep NPO.  -Discussed with Dr. Sallyanne Kuster. Plan for cath this am. Orders placed.   Anxiety -Continuing on Xanax  CKD stage 2 -serum creatinine is 1.37, baseline 1-1.2 -Treated with 500cc NS in ED -Stat BMet ordered to recheck creatinine.   For questions or updates, please contact [place] Please consult [email or web address] for contact info under Cardiology/STEMI.   Signed, Daune Perch, NP  04/10/2017 7:43 AM   I have seen and examined the patient along with Daune Perch, NP.  I have reviewed the chart, notes and new data.  I agree with NP's note.  Key new complaints: mild nagging discomfort at apex which has been present since the initial MI and PCI. Reports compliance with DAPT. Key examination changes: no overt HF, no arrhythmia, normal CV exam Key new findings / data: creat 1.37 is only marginally higher than baseline (also not bad considering his body habitus). Trop 1.0 (and rising),. Marked changes on ECG c/w new ischemia in LAD artery distribution.  PLAN: Post-infarction angina, ECG, enzymes all suggest recurrent proximal LAD distribution ischemia. Needs repeat coronary angio and possible PCI. Doubt this represents focal pericarditis or coronary vasospasm. This procedure has been fully reviewed with the patient and written informed consent has been obtained. Will start heparin IV while we wait for the cath.  Sanda Klein, MD, [place] [phone] 04/10/2017, 9:10 AM

## 2017-04-10 NOTE — ED Triage Notes (Signed)
Pt took 2NTG PTA with little relief

## 2017-04-10 NOTE — Progress Notes (Signed)
Patient seen and examined. H and P reviewed.  He is chest pain free.  He came from cath.    1-Chest pain; concern with angina. Troponin elevated at 1--2.  Underwent cath today, showed patent stent.  Cardiology recommend medical management.  Start pepcid   2-CKD stage II;  mildly increase cr at 1.3.  Repeat labs in am post cath.   3-Recent N-STMI; on brilinta.   James [name]. MD.

## 2017-04-10 NOTE — H&P (Signed)
History and Physical    James [name] IZT:[id number] DOB: 1960/10/26 DOA: 04/10/2017  PCP: [name], MD   Patient coming from: Home  Chief Complaint: Chest pain   HPI: James [name] is a 57 y.o. male with medical history significant for hepatitis C and coronary artery disease with stent placed to LAD on 03/29/2017, now presenting to the emergency department for evaluation of chest pain.  Patient reports that he had an unremarkable day yesterday and was in his usual state when he went to sleep.  He woke with pain beneath both breasts and radiating up into the neck, similar to his symptoms with recent MI.  Denies associated dyspnea, nausea, or diaphoresis.  Denies any recent cough, fevers, or chills.  Reports continued adherence with his medications.  Denies lower extremity swelling or tenderness.  Patient had been admitted to the hospital earlier this month, had peak troponin of 45, underwent cardiac cath on 03/29/2017 revealing single-vessel CAD with 100% stenosis of the mid LAD, treated with thrombectomy and DES.  He was discharged on statin, ACE, beta-blocker, aspirin, and Brilinta.  Imdur was discontinued due to headache.  Patient was treated with 324 mg of aspirin and nitroglycerin x2 prior to arrival.  ED Course: Upon arrival to the ED, patient is found to be afebrile, saturating well on room air, and with vitals otherwise normal.  EKG features a sinus rhythm with PAC.  Chest x-rays negative for acute cardiopulmonary disease.  Chemistry panel reveals a creatinine of 1.37, slightly up from his apparent baseline.  CBC is unremarkable.  Troponin is slightly elevated at 0.03.  Patient was given 500 cc of normal saline and 4 mg IV morphine.  Pain resolved in the ED.  Cardiology was consulted by the ED physician and recommended medical observation with serial troponin measurements.  Patient remains hemodynamically stable, in no apparent respiratory distress, and will be observed on the telemetry  unit for ongoing evaluation and management of chest pain.  Review of Systems:  All other systems reviewed and apart from HPI, are negative.  Past Medical History:  Diagnosis Date  . Acute systolic congestive heart failure ([place])   . Anemia    slight anemia with hepatitis tx  . Arthritis    oa  . Cancer ([place]) 4 months ago   basal cell skin cancer removed from chest area  . GERD (gastroesophageal reflux disease)   . Hepatitis C 04/29/2012   had savalta treatment 2 years ago, now cured  . Hyperlipidemia   . NSTEMI (non-ST elevated myocardial infarction) ([place]) 03/29/2017   1/19 PCI/DESx1 to mLAD, EF 45%  . Palpitations    at times     Past Surgical History:  Procedure Laterality Date  . ARTHROSCOPIC REPAIR ACL Right 15 years ago  . CORONARY STENT INTERVENTION N/A 03/29/2017   Procedure: CORONARY STENT INTERVENTION;  Surgeon: Martinique, Peter M, MD;  Location: [place] [facility];  Service: Cardiovascular;  Laterality: N/A;  . CORONARY THROMBECTOMY N/A 03/29/2017   Procedure: Coronary Thrombectomy;  Surgeon: Martinique, Peter M, MD;  Location: [place] [facility];  Service: Cardiovascular;  Laterality: N/A;  . LEFT HEART CATH AND CORONARY ANGIOGRAPHY N/A 03/29/2017   Procedure: LEFT HEART CATH AND CORONARY ANGIOGRAPHY;  Surgeon: Martinique, Peter M, MD;  Location: [place] [facility];  Service: Cardiovascular;  Laterality: N/A;  . TOTAL HIP ARTHROPLASTY Right 01/11/2015   Procedure: RIGHT TOTAL HIP ARTHROPLASTY ANTERIOR APPROACH;  Surgeon: Paralee Cancel, MD;  Location[facility];  Service: Orthopedics;  Laterality: Right;     reports that he has been smoking cigars.  he has never used smokeless tobacco. He reports that he does not drink alcohol or use drugs.  No Known Allergies  Family History  Problem Relation Age of Onset  . Alzheimer's disease Mother   . Heart failure Father   . CAD Father      Prior to Admission medications   Medication Sig Start Date End Date Taking? Authorizing Provider    ALPRAZolam (XANAX) 0.25 MG tablet Take 1 tablet (0.25 mg total) by mouth 2 (two) times daily as needed for anxiety. 04/08/17  Yes Daleen Bo, MD  aspirin 81 MG tablet Take 1 tablet (81 mg total) by mouth daily. 03/31/17  Yes Bhagat, Bhavinkumar, PA  atorvastatin (LIPITOR) 80 MG tablet Take 1 tablet (80 mg total) by mouth daily at 6 PM. 04/09/17  Yes Almyra Deforest, PA  colchicine 0.6 MG tablet Take 1 tablet (0.6 mg total) by mouth 2 (two) times daily as needed (gout flare up). 04/09/17  Yes Almyra Deforest, PA  lisinopril (PRINIVIL,ZESTRIL) 2.5 MG tablet Take 1 tablet (2.5 mg total) by mouth daily. 04/09/17 11/05/17 Yes Almyra Deforest, PA  metoprolol succinate (TOPROL-XL) 25 MG 24 hr tablet Take 1 tablet (25 mg total) by mouth daily. 04/09/17  Yes Almyra Deforest, PA  nitroGLYCERIN (NITROSTAT) 0.4 MG SL tablet Place 1 tablet (0.4 mg total) under the tongue every 5 (five) minutes as needed. 03/31/17  Yes Bhagat, Bhavinkumar, PA  ticagrelor (BRILINTA) 90 MG TABS tablet Take 1 tablet (90 mg total) by mouth 2 (two) times daily. 04/09/17  Yes Almyra Deforest, PA  valACYclovir (VALTREX) 1000 MG tablet Take 1,000 mg by mouth daily as needed (for fever blisters). Reported on 09/22/2015 11/18/14  Yes [provider]    Physical Exam: Vitals:   04/10/17 0345 04/10/17 0400 04/10/17 0415 04/10/17 0430  BP: 106/89 (!) 132/94 116/86 120/90  Pulse: 92 84 82 84  Resp: 13 (!) 21 17 15   Temp:      TempSrc:      SpO2: 97% 98% 96% 97%  Weight:      Height:          Constitutional: NAD, anxious  Eyes: PERTLA, lids and conjunctivae normal ENMT: Mucous membranes are moist. Posterior pharynx clear of any exudate or lesions.   Neck: normal, supple, no masses, no thyromegaly Respiratory: clear to auscultation bilaterally, no wheezing, no crackles. Normal respiratory effort.    Cardiovascular: S1 & S2 heard, regular rate and rhythm. No extremity edema. No significant JVD. Abdomen: No distension, no tenderness, no masses palpated. Bowel  sounds normal.  Musculoskeletal: no clubbing / cyanosis. No joint deformity upper and lower extremities.    Skin: no significant rashes, lesions, ulcers. Warm, dry, well-perfused. Neurologic: CN 2-12 grossly intact. Sensation intact. Strength 5/5 in all 4 limbs.  Psychiatric: Alert and oriented x 3. Anxious. Cooperative.     Labs on Admission: I have personally reviewed following labs and imaging studies  CBC: Recent Labs  Lab 04/08/17 1931 04/10/17 0253  WBC 9.1 6.9  HGB 16.7 16.7  HCT 49.7 49.4  MCV 90.0 90.3  PLT 257 458   Basic Metabolic Panel: Recent Labs  Lab 04/08/17 1931 04/10/17 0253  NA 136 140  K 3.9 3.7  CL 103 103  CO2 22 26  GLUCOSE 98 100*  BUN 26* 27*  CREATININE 1.20 1.37*  CALCIUM 9.4 9.4   GFR: Estimated Creatinine Clearance: 66.6 mL/min (A) (by C-G  formula based on SCr of 1.37 mg/dL (H)). Liver Function Tests: No results for input(s): AST, ALT, ALKPHOS, BILITOT, PROT, ALBUMIN in the last 168 hours. No results for input(s): LIPASE, AMYLASE in the last 168 hours. No results for input(s): AMMONIA in the last 168 hours. Coagulation Profile: No results for input(s): INR, PROTIME in the last 168 hours. Cardiac Enzymes: Recent Labs  Lab 04/10/17 0307  TROPONINI 0.03*   BNP (last 3 results) No results for input(s): PROBNP in the last 8760 hours. HbA1C: No results for input(s): HGBA1C in the last 72 hours. CBG: No results for input(s): GLUCAP in the last 168 hours. Lipid Profile: No results for input(s): CHOL, HDL, LDLCALC, TRIG, CHOLHDL, LDLDIRECT in the last 72 hours. Thyroid Function Tests: No results for input(s): TSH, T4TOTAL, FREET4, T3FREE, THYROIDAB in the last 72 hours. Anemia Panel: No results for input(s): VITAMINB12, FOLATE, FERRITIN, TIBC, IRON, RETICCTPCT in the last 72 hours. Urine analysis:    Component Value Date/Time   COLORURINE YELLOW 01/06/2015 [place] 01/06/2015 1459   LABSPEC 1.022 01/06/2015 1459    PHURINE 5.0 01/06/2015 1459   GLUCOSEU NEGATIVE 01/06/2015 1459   HGBUR NEGATIVE 01/06/2015 1459   BILIRUBINUR NEGATIVE 01/06/2015 1459   KETONESUR NEGATIVE 01/06/2015 1459   PROTEINUR NEGATIVE 01/06/2015 1459   UROBILINOGEN 0.2 01/06/2015 1459   NITRITE NEGATIVE 01/06/2015 1459   LEUKOCYTESUR NEGATIVE 01/06/2015 1459   Sepsis Labs: @LABRCNTIP (procalcitonin:4,lacticidven:4) )No results found for this or any previous visit (from the past 240 hour(s)).   Radiological Exams on Admission: Dg Chest 2 View  Result Date: 04/08/2017 CLINICAL DATA:  Chest pain, shortness of Breath EXAM: CHEST  2 VIEW COMPARISON:  03/29/2017 FINDINGS: Heart is upper limits normal in size. Lungs are clear. No effusions or acute bony abnormality. IMPRESSION: No active cardiopulmonary disease. Electronically Signed   By: Rolm Baptise M.D.   On: 04/08/2017 21:18   Dg Chest Portable 1 View  Result Date: 04/10/2017 CLINICAL DATA:  Central chest pain radiating to the neck and left arm. Lightheadedness. EXAM: PORTABLE CHEST 1 VIEW COMPARISON:  04/08/2017 FINDINGS: Shallow inspiration. Normal heart size and pulmonary vascularity. No focal airspace disease or consolidation in the lungs. No blunting of costophrenic angles. No pneumothorax. Mediastinal contours appear intact. IMPRESSION: No active disease. Electronically Signed   By: Lucienne Capers M.D.   On: 04/10/2017 03:33    EKG: Independently reviewed. Sinus rhythm, PAC.   Assessment/Plan  1. Chest pain; CAD  - Presents with chest pain upon waking, described as similar to recent MI, eased-off after treatment with ASA 324, NTG x2, and IV morphine  - CXR is unremarkable, troponin slightly elevated  - Cardiology recommended observing and trending enzymes  - Continue cardiac monitoring, repeat EKG, obtain serial troponin measurements, continue ASA, Brilinta, Lipitor, lisinopril, and Toprol   2. Anxiety  - Continue low-dose prn Xanax    3. CKD stage II  - SCr is  1.37 on admission, slightly up from apparent baseline - Treated in ED with 500 cc NS    DVT prophylaxis: Lovenox Code Status: Full  Family Communication: Significant other updated at bedside Disposition Plan: Observe on telemetry Consults called: ED physician discussed with cardiology  Admission status: Observation    Vianne Bulls, MD [facility] Pager [phone]  If 7PM-7AM, please contact night-coverage [email or web address] Password [facility]  04/10/2017, 4:54 AM

## 2017-04-10 NOTE — Progress Notes (Signed)
I have reviewed his echo and coronary angiogram. There is indeed hypokinesis of the apical segments of the anterior and anteroseptal walls, but preserved overall LVEF. I do not think repeating the study with Definity is necessary. Wall motion abnormalities are expected following LD-related NSTEMI. His LAD stent is widely patent and well deployed, there is mild "jailing" of the diagonal artery. Troponin seems to be "peaking" at 2.7. He is asymptomatic at this time. The exact mechanism of this second small NSTEMI event is unclear. Could be transient partial stent thrombosis or coronary vasospasm at the edges of the stent? Unable to prove either. Reinforced compliance with DAPT 100% of the time. Will start very low dose long acting nitrates (had headache when we tried higher doses on last admission). Observe overnight and repeat troponin in AM.  Sanda Klein, MD, [facility] [phone] office [phone] pager

## 2017-04-11 ENCOUNTER — Other Ambulatory Visit: Payer: Self-pay

## 2017-04-11 ENCOUNTER — Ambulatory Visit (INDEPENDENT_AMBULATORY_CARE_PROVIDER_SITE_OTHER): Payer: BLUE CROSS/BLUE SHIELD | Admitting: Cardiovascular Disease

## 2017-04-11 ENCOUNTER — Encounter: Payer: Self-pay | Admitting: Cardiovascular Disease

## 2017-04-11 ENCOUNTER — Ambulatory Visit: Payer: BLUE CROSS/BLUE SHIELD | Admitting: Cardiovascular Disease

## 2017-04-11 VITALS — BP 100/78 | HR 76 | Ht 70.0 in | Wt 201.0 lb

## 2017-04-11 DIAGNOSIS — I25110 Atherosclerotic heart disease of native coronary artery with unstable angina pectoris: Secondary | ICD-10-CM | POA: Diagnosis not present

## 2017-04-11 DIAGNOSIS — I214 Non-ST elevation (NSTEMI) myocardial infarction: Secondary | ICD-10-CM

## 2017-04-11 DIAGNOSIS — E785 Hyperlipidemia, unspecified: Secondary | ICD-10-CM | POA: Diagnosis not present

## 2017-04-11 DIAGNOSIS — G4700 Insomnia, unspecified: Secondary | ICD-10-CM

## 2017-04-11 DIAGNOSIS — G4733 Obstructive sleep apnea (adult) (pediatric): Secondary | ICD-10-CM

## 2017-04-11 DIAGNOSIS — N182 Chronic kidney disease, stage 2 (mild): Secondary | ICD-10-CM | POA: Diagnosis not present

## 2017-04-11 DIAGNOSIS — R079 Chest pain, unspecified: Secondary | ICD-10-CM | POA: Diagnosis not present

## 2017-04-11 DIAGNOSIS — G4719 Other hypersomnia: Secondary | ICD-10-CM

## 2017-04-11 DIAGNOSIS — R0683 Snoring: Secondary | ICD-10-CM | POA: Diagnosis not present

## 2017-04-11 LAB — BASIC METABOLIC PANEL
Anion gap: 8 (ref 5–15)
BUN: 21 mg/dL — ABNORMAL HIGH (ref 6–20)
CO2: 23 mmol/L (ref 22–32)
Calcium: 8.9 mg/dL (ref 8.9–10.3)
Chloride: 109 mmol/L (ref 101–111)
Creatinine, Ser: 1.19 mg/dL (ref 0.61–1.24)
GFR calc Af Amer: >60 mL/min (ref >60)
GFR calc non Af Amer: >60 mL/min (ref >60)
Glucose, Bld: 102 mg/dL — ABNORMAL HIGH (ref 65–99)
Potassium: 4.7 mmol/L (ref 3.5–5.1)
Sodium: 140 mmol/L (ref 135–145)

## 2017-04-11 MED ORDER — FAMOTIDINE 20 MG PO TABS: 20.0000 mg | ORAL_TABLET | Freq: Two times a day (BID) | ORAL | 0 refills | Status: DC

## 2017-04-11 MED ORDER — ISOSORBIDE MONONITRATE ER 30 MG PO TB24: 15.0000 mg | ORAL_TABLET | Freq: Every day | ORAL | 0 refills | Status: DC

## 2017-04-11 MED FILL — Heparin Sodium (Porcine) 2 Unit/ML in Sodium Chloride 0.9%: INTRAMUSCULAR | Qty: 1000 | Status: AC

## 2017-04-11 MED FILL — Lidocaine HCl Local Inj 1%: INTRAMUSCULAR | Qty: 20 | Status: AC

## 2017-04-11 NOTE — Discharge Instructions (Signed)

## 2017-04-11 NOTE — Discharge Summary (Signed)
Physician Discharge Summary  James [name] VFI:[id number] DOB: [birth year] DOA: 04/10/2017  PCP: James Cruel, MD  Admit date: 04/10/2017 Discharge date: 04/11/2017  Admitted From: Home  Disposition: Home   Recommendations for Outpatient Follow-up:  1. Follow up with PCP in 1-2 weeks 2. Please obtain BMP/CBC in one week 3. Follow with cardiology for further care CAD   Discharge Condition: stable.  CODE STATUS: full code.  Diet recommendation: Heart Healthy  Brief/Interim Summary:  James [name] is a 57 y.o. male with medical history significant for hepatitis C and coronary artery disease with stent placed to LAD on 03/29/2017, now presenting to the emergency department for evaluation of chest pain.  Patient reports that he had an unremarkable day yesterday and was in his usual state when he went to sleep.  He woke with pain beneath both breasts and radiating up into the neck, similar to his symptoms with recent MI.  Denies associated dyspnea, nausea, or diaphoresis.  Denies any recent cough, fevers, or chills.  Reports continued adherence with his medications.  Denies lower extremity swelling or tenderness.  Patient had been admitted to the hospital earlier this month, had peak troponin of 45, underwent cardiac cath on 03/29/2017 revealing single-vessel CAD with 100% stenosis of the mid LAD, treated with thrombectomy and DES.  He was discharged on statin, ACE, beta-blocker, aspirin, and Brilinta.  Imdur was discontinued due to headache.  Patient was treated with 324 mg of aspirin and nitroglycerin x2 prior to arrival.  ED Course: Upon arrival to the ED, patient is found to be afebrile, saturating well on room air, and with vitals otherwise normal.  EKG features a sinus rhythm with PAC.  Chest x-rays negative for acute cardiopulmonary disease.  Chemistry panel reveals a creatinine of 1.37, slightly up from his apparent baseline.  CBC is unremarkable.  Troponin is slightly elevated at 0.03.   Patient was given 500 cc of normal saline and 4 mg IV morphine.  Pain resolved in the ED.  Cardiology was consulted by the ED physician and recommended medical observation with serial troponin measurements.  Patient remains hemodynamically stable, in no apparent respiratory distress, and will be observed on the telemetry unit for ongoing evaluation and management of chest pain.    N-STEMI; second episode could be vaso-spasm vs transient stent stenosis.  Chest pain; concern with angina. Troponin elevated at 1--2.  Underwent cath today, showed patent stent.  Cardiology recommend medical management.  Start pepcid  Discharge on Imdur 15 mg daily. Continue with brilinta,  Continue with aspirin, statins, BB.   2-CKD stage II;  mildly increase cr at 1.3.  Repeat labs in am post cath.   3-Recent N-STMI; on brilinta.       Discharge Diagnoses:    NSTEMI   Chest pain   CKD (chronic kidney disease), stage II   Anxiety   CAD (coronary artery disease)    Discharge Instructions  Discharge Instructions    Diet - low sodium heart healthy   Complete by:  As directed    Increase activity slowly   Complete by:  As directed      Allergies as of 04/11/2017   No Known Allergies     Medication List    TAKE these medications   ALPRAZolam 0.25 MG tablet Commonly known as:  XANAX Take 1 tablet (0.25 mg total) by mouth 2 (two) times daily as needed for anxiety.   aspirin 81 MG tablet Take 1 tablet (81 mg total) by mouth daily.  atorvastatin 80 MG tablet Commonly known as:  LIPITOR Take 1 tablet (80 mg total) by mouth daily at 6 PM.   colchicine 0.6 MG tablet Take 1 tablet (0.6 mg total) by mouth 2 (two) times daily as needed (gout flare up).   famotidine 20 MG tablet Commonly known as:  PEPCID Take 1 tablet (20 mg total) by mouth 2 (two) times daily.   isosorbide mononitrate 30 MG 24 hr tablet Commonly known as:  IMDUR Take 0.5 tablets (15 mg total) by mouth daily. Start  taking on:  04/12/2017   lisinopril 2.5 MG tablet Commonly known as:  PRINIVIL,ZESTRIL Take 1 tablet (2.5 mg total) by mouth daily.   metoprolol succinate 25 MG 24 hr tablet Commonly known as:  TOPROL-XL Take 1 tablet (25 mg total) by mouth daily.   nitroGLYCERIN 0.4 MG SL tablet Commonly known as:  NITROSTAT Place 1 tablet (0.4 mg total) under the tongue every 5 (five) minutes as needed.   ticagrelor 90 MG Tabs tablet Commonly known as:  BRILINTA Take 1 tablet (90 mg total) by mouth 2 (two) times daily.   valACYclovir 1000 MG tablet Commonly known as:  VALTREX Take 1,000 mg by mouth daily as needed (for fever blisters). Reported on 09/22/2015      Follow-up Information    James [name], [place] Follow up.   Specialties:  Cardiology, Radiology Why:  You have a hospital follow up scheduled for February 14th at 8:00.  Contact information: [address] [place] [address] [phone]          No Known Allergies  Consultations:  Cardiology    Procedures/Studies: Dg Chest 2 View  Result Date: 04/08/2017 CLINICAL DATA:  Chest pain, shortness of Breath EXAM: CHEST  2 VIEW COMPARISON:  03/29/2017 FINDINGS: Heart is upper limits normal in size. Lungs are clear. No effusions or acute bony abnormality. IMPRESSION: No active cardiopulmonary disease. Electronically Signed   By: Rolm Baptise M.D.   On: 04/08/2017 21:18   Dg Chest 2 View  Result Date: 03/29/2017 CLINICAL DATA:  Chest pain EXAM: CHEST  2 VIEW COMPARISON:  None. FINDINGS: Lungs are clear. Heart size and pulmonary vascularity are normal. No adenopathy. No pneumothorax. No bone lesions. IMPRESSION: No edema or consolidation. Electronically Signed   By: Lowella Grip III M.D.   On: 03/29/2017 14:09   Dg Chest Portable 1 View  Result Date: 04/10/2017 CLINICAL DATA:  Central chest pain radiating to the neck and left arm. Lightheadedness. EXAM: PORTABLE CHEST 1 VIEW COMPARISON:  04/08/2017 FINDINGS: Shallow  inspiration. Normal heart size and pulmonary vascularity. No focal airspace disease or consolidation in the lungs. No blunting of costophrenic angles. No pneumothorax. Mediastinal contours appear intact. IMPRESSION: No active disease. Electronically Signed   By: Lucienne Capers M.D.   On: 04/10/2017 03:33      Subjective: Chest pain free, feeling better.   Discharge Exam: Vitals:   04/11/17 0000 04/11/17 0634  BP: 99/60 117/76  Pulse:  71  Resp:  17  Temp:  97.6 F (36.4 C)  SpO2: 95% 99%   Vitals:   04/10/17 1459 04/10/17 2123 04/11/17 0000 04/11/17 0634  BP: (!) 100/58 (!) 90/58 99/60 117/76  Pulse:  65  71  Resp:  16  17  Temp: (!) 97.3 F (36.3 C) 98.3 F (36.8 C)  97.6 F (36.4 C)  TempSrc: Oral Oral  Oral  SpO2: 95% 90% 95% 99%  Weight:    90.6 kg (199 lb 11.2  oz)  Height:        General: Pt is alert, awake, not in acute distress Cardiovascular: RRR, S1/S2 +, no rubs, no gallops Respiratory: CTA bilaterally, no wheezing, no rhonchi Abdominal: Soft, NT, ND, bowel sounds + Extremities: no edema, no cyanosis    The results of significant diagnostics from this hospitalization (including imaging, microbiology, ancillary and laboratory) are listed below for reference.     Microbiology: No results found for this or any previous visit (from the past 240 hour(s)).   Labs: BNP (last 3 results) No results for input(s): BNP in the last 8760 hours. Basic Metabolic Panel: Recent Labs  Lab 04/08/17 1931 04/10/17 0253 04/10/17 0903 04/11/17 0509  NA 136 140 140 140  K 3.9 3.7 4.5 4.7  CL 103 103 107 109  CO2 22 26 24 23   GLUCOSE 98 100* 97 102*  BUN 26* 27* 23* 21*  CREATININE 1.20 1.37* 1.09 1.19  CALCIUM 9.4 9.4 9.1 8.9   Liver Function Tests: No results for input(s): AST, ALT, ALKPHOS, BILITOT, PROT, ALBUMIN in the last 168 hours. No results for input(s): LIPASE, AMYLASE in the last 168 hours. No results for input(s): AMMONIA in the last 168  hours. CBC: Recent Labs  Lab 04/08/17 1931 04/10/17 0253  WBC 9.1 6.9  HGB 16.7 16.7  HCT 49.7 49.4  MCV 90.0 90.3  PLT 257 235   Cardiac Enzymes: Recent Labs  Lab 04/10/17 0307 04/10/17 0627 04/10/17 1333 04/10/17 1820  TROPONINI 0.03* 1.03* 2.76* 2.81*   BNP: Invalid input(s): POCBNP CBG: No results for input(s): GLUCAP in the last 168 hours. D-Dimer No results for input(s): DDIMER in the last 72 hours. Hgb A1c No results for input(s): HGBA1C in the last 72 hours. Lipid Profile No results for input(s): CHOL, HDL, LDLCALC, TRIG, CHOLHDL, LDLDIRECT in the last 72 hours. Thyroid function studies No results for input(s): TSH, T4TOTAL, T3FREE, THYROIDAB in the last 72 hours.  Invalid input(s): FREET3 Anemia work up No results for input(s): VITAMINB12, FOLATE, FERRITIN, TIBC, IRON, RETICCTPCT in the last 72 hours. Urinalysis    Component Value Date/Time   COLORURINE YELLOW 01/06/2015 1459   APPEARANCEUR CLEAR 01/06/2015 1459   LABSPEC 1.022 01/06/2015 1459   PHURINE 5.0 01/06/2015 1459   GLUCOSEU NEGATIVE 01/06/2015 1459   HGBUR NEGATIVE 01/06/2015 1459   BILIRUBINUR NEGATIVE 01/06/2015 1459   KETONESUR NEGATIVE 01/06/2015 1459   PROTEINUR NEGATIVE 01/06/2015 1459   UROBILINOGEN 0.2 01/06/2015 1459   NITRITE NEGATIVE 01/06/2015 1459   LEUKOCYTESUR NEGATIVE 01/06/2015 1459   Sepsis Labs Invalid input(s): PROCALCITONIN,  WBC,  LACTICIDVEN Microbiology No results found for this or any previous visit (from the past 240 hour(s)).   Time coordinating discharge: Over 30 minutes  SIGNED:   Elmarie Shiley, MD  [facility] 04/11/2017, 3:37 PM Pager   If 7PM-7AM, please contact night-coverage [email or web address] Password [email or web address]

## 2017-04-11 NOTE — Progress Notes (Signed)
Progress Note  Patient Name: James [name] Date of Encounter: 04/11/2017  Primary Cardiologist: [name], MD   Subjective   Pt feeling well today. No further chest pain. He is quite anxious about this happening again. Pt reassured.   Inpatient Medications    Scheduled Meds: . aspirin  81 mg Oral Daily  . atorvastatin  80 mg Oral q1800  . famotidine  20 mg Oral BID  . isosorbide mononitrate  15 mg Oral Daily  . lisinopril  2.5 mg Oral Daily  . metoprolol succinate  25 mg Oral Daily  . sodium chloride flush  3 mL Intravenous Q12H  . ticagrelor  90 mg Oral BID  . zolpidem  5 mg Oral Once   Continuous Infusions: . sodium chloride     PRN Meds: sodium chloride, acetaminophen, ALPRAZolam, alum & mag hydroxide-simeth, morphine injection, nitroGLYCERIN, ondansetron (ZOFRAN) IV, sodium chloride flush   Vital Signs    Vitals:   04/10/17 1459 04/10/17 2123 04/11/17 0000 04/11/17 0634  BP: (!) 100/58 (!) 90/58 99/60 117/76  Pulse:  65  71  Resp:  16  17  Temp: (!) 97.3 F (36.3 C) 98.3 F (36.8 C)  97.6 F (36.4 C)  TempSrc: Oral Oral  Oral  SpO2: 95% 90% 95% 99%  Weight:    199 lb 11.2 oz (90.6 kg)  Height:        Intake/Output Summary (Last 24 hours) at 04/11/2017 0818 Last data filed at 04/10/2017 2000 Gross per 24 hour  Intake 1260 ml  Output -  Net 1260 ml   Filed Weights   04/10/17 0256 04/10/17 0544 04/11/17 0634  Weight: 200 lb (90.7 kg) 201 lb (91.2 kg) 199 lb 11.2 oz (90.6 kg)    Telemetry    Sinus rhythm in the 60's-70's with PACs - Personally Reviewed  ECG    No new tracings - Personally Reviewed  Physical Exam   GEN: No acute distress.   Neck: No JVD Cardiac: RRR, no murmurs, rubs, or gallops.  Respiratory: Clear to auscultation bilaterally. GI: Soft, nontender, non-distended  MS: No edema; No deformity. Neuro:  Nonfocal  Psych: Normal affect   Right radial cath site without hematoma or tenderness.   Labs    Chemistry Recent  Labs  Lab 04/10/17 0253 04/10/17 0903 04/11/17 0509  NA 140 140 140  K 3.7 4.5 4.7  CL 103 107 109  CO2 26 24 23   GLUCOSE 100* 97 102*  BUN 27* 23* 21*  CREATININE 1.37* 1.09 1.19  CALCIUM 9.4 9.1 8.9  GFRNONAA 55* >60 >60  GFRAA >60 >60 >60  ANIONGAP 11 9 8      Hematology Recent Labs  Lab 04/08/17 1931 04/10/17 0253  WBC 9.1 6.9  RBC 5.52 5.47  HGB 16.7 16.7  HCT 49.7 49.4  MCV 90.0 90.3  MCH 30.3 30.5  MCHC 33.6 33.8  RDW 13.4 13.1  PLT 257 235    Cardiac Enzymes Recent Labs  Lab 04/10/17 0307 04/10/17 0627 04/10/17 1333 04/10/17 1820  TROPONINI 0.03* 1.03* 2.76* 2.81*    Recent Labs  Lab 04/08/17 1942 04/10/17 0256  TROPIPOC 0.06 0.03     BNPNo results for input(s): BNP, PROBNP in the last 168 hours.   DDimer No results for input(s): DDIMER in the last 168 hours.   Radiology    Dg Chest Portable 1 View  Result Date: 04/10/2017 CLINICAL DATA:  Central chest pain radiating to the neck and left arm. Lightheadedness. EXAM: PORTABLE CHEST  1 VIEW COMPARISON:  04/08/2017 FINDINGS: Shallow inspiration. Normal heart size and pulmonary vascularity. No focal airspace disease or consolidation in the lungs. No blunting of costophrenic angles. No pneumothorax. Mediastinal contours appear intact. IMPRESSION: No active disease. Electronically Signed   By: Lucienne Capers M.D.   On: 04/10/2017 03:33    Cardiac Studies    LEFT HEART CATH AND CORONARY ANGIOGRAPHY 04/10/2017  Conclusion    Previously placed Prox LAD stent (unknown type) is widely patent.  LV end diastolic pressure is normal.   1. No significant obstructive CAD. The stent in the LAD is widely patent. 2. Normal LVEDP  Plan: continue medical therapy.   Echocardiogram 04/10/2017 Study Conclusions - Left ventricle: Poor acoustical windows and some forshortened   views limit assessment of wall motion. Overall LVF appears   preserved with EF 55-60%. Images in the parasternal and apical 3    chamber views are suggestive of severe hypokinesis to akinesis of   the mid anteroseptal and apical anterior walls. Suggest limited   study with definity contrast to define further if clinically   indicated. The cavity size was normal. There was moderate   concentric hypertrophy. Images were inadequate for LV wall motion   assessment. The study is not technically sufficient to allow   evaluation of LV diastolic function. - Pulmonary arteries: Systolic pressure could not be accurately   estimated.   Patient Profile     57 y.o. male with a hx of polycythemia, hepatitis C, basal cell skin CA and recent NSTEMI w/DES to Prox LAD on 03/29/17 who is being seen today for the evaluation of chest at the request of Dr. Tyrell Antonio.  Assessment & Plan    Chest pain with elevated troponin -Recent NSTEMI 03/29/17 with troponin peak of 45.54. He went for urgent cardiac catheterization that revealed a 100% proximal LAD lesion that was treated with thrombectomy and 3.5 x 16 mm Synergy DES, EF 45%. -He is treated with aspirin, Brilinta, high intensity statin, ACE-I. Reports compliance with all meds.  -Presented with 2 episodes of chest tightness tightness similar to his MI. Troponins were elevated, peaked at 2.8. Also had EKG changes.  -Pt was taken to the cath lab and found to have No significant obstructive CAD. The stent in the LAD is widely patent, there is mild "jailing" of the diagonal artery.  Normal LVEDP -Echo showed hypokinesis of the apical segments of the anterior and anteroseptal walls, but preserved LVEF. Wall motion abnormalities are expected following LD-related NSTEMI -Low dose nitrate has been added, as he had headache previously with higher dose -He is currently asymptomatic.  -Per Dr. Sallyanne Kuster: The exact mechanism of this second small NSTEMI event is unclear. Could be transient partial stent thrombosis or coronary vasospasm at the edges of the stent? Unable to prove either. Reinforced  compliance with DAPT 100% of the time. -The patient was monitored overnight and has done well. No further chest pain. Right radial cath site stable.  -The patient is stable for discharge today.  -I will arrange for early follow up in our office.  -Pt plans to attend cardiac rehab and I  strongly encouraged cardiac rehab as he will be monitored there and this will reassure him.   Anxiety -Continuing on Xanax  CKD stage 2 -serum creatinine on adm 1.37, baseline 1-1.2 -Treated with 500cc NS in ED. Follow up SCr 1.09.  -This am post cath SCr 1.19 -Follow up at follow up office visit  Possible sleep apnea -Pt has appointment  today with [name] to be evaluated for possible sleep apnea.    For questions or updates, please contact [place] Please consult [email or web address] for contact info under Cardiology/STEMI.      Signed, Daune Perch, NP  04/11/2017, 8:18 AM    I have seen and examined the patient along with Daune Perch, NP .  I have reviewed the chart, notes and new data.  I agree with NP's note.  Key new complaints: no further angina Key examination changes: normal exam, healthy radial access site Key new findings / data: troponin has peaked, creat trending down  PLAN: DC today. Add low dose nitrates. Discussed appropriate use of SL NTG and when to seek emergency medical attention. Cardiac rehab.  Sanda Klein, MD, [place] [facility] [phone] 04/11/2017, 9:22 AM

## 2017-04-11 NOTE — Patient Instructions (Signed)
Medication Instructions:  Your physician recommends that you continue on your current medications as directed. Please refer to the Current Medication list given to you today.  Testing/Procedures: Your physician has recommended that you have a sleep study ASAP. This test records several body functions during sleep, including: brain activity, eye movement, oxygen and carbon dioxide blood levels, heart rate and rhythm, breathing rate and rhythm, the flow of air through your mouth and nose, snoring, body muscle movements, and chest and belly movement. --this must be pre-approved by insurance prior to scheduling.  Once approved someone will call to schedule this test.    Follow-Up: Your physician recommends that you schedule a follow-up appointment in: 3 months with [name] (sleep clinic).   Any Other Special Instructions Will Be Listed Below (If Applicable).     If you need a refill on your cardiac medications before your next appointment, please call your pharmacy.

## 2017-04-12 ENCOUNTER — Telehealth (HOSPITAL_COMMUNITY): Payer: Self-pay

## 2017-04-12 ENCOUNTER — Telehealth (HOSPITAL_COMMUNITY): Payer: Self-pay | Admitting: *Deleted

## 2017-04-12 ENCOUNTER — Telehealth: Payer: Self-pay | Admitting: Cardiology

## 2017-04-12 NOTE — Telephone Encounter (Signed)
[facility] support staff asked me to contact James [name] regarding cardiac rehab.   James [name] was very angry with [place] because our earliest orientation appt is March 14.  He wanted to start ASAP and that simply is not possible.  I offered to contact [facility] and [facility] to see if they had an earlier appt.  He rudely declined. He asked for exercise guideline for body lifting. I told him that I could not since I have not ever seen him, nor know what his exercise tolerance, EKG and BP.  He accused me of not telling him the truth.  I offered to place him on the cancellation list.   He conceded to this then hung up. Cherre Huger, BSN Cardiac and Training and development officer

## 2017-04-12 NOTE — Telephone Encounter (Signed)
Spoke to patient - Patient is eager to get started with Cardiac Rehabilitation. Called back to see if their were other options for him to proceed with Rehab sooner. Told patient I would call a few places. Called [facility] and they have availability sooner. Spoke with patient and gave the ok to send information to [facility]. Patient awaiting phone call from [facility].

## 2017-04-12 NOTE — Telephone Encounter (Signed)
Called patient in regards to Cardiac Rehab - Patient is really interested in the program although he is not happy that he cannot begin until March. Scheduled orientation on 05/23/2017 at 8:45am. Patient will attend the 1:15pm exc class.

## 2017-04-12 NOTE — Telephone Encounter (Signed)
Pt wants to know if he can get in Cardiac Rehab sooner,even if it means going to another facilty

## 2017-04-12 NOTE — Telephone Encounter (Signed)
Pt notified that this is the average right now per scheduling- he states that he has [facility] [name] [facility] rehab working on it and just thought that Dr Martinique could make them fit him in. Informed him that they know his priority and they will have to fit him in sooner

## 2017-04-14 ENCOUNTER — Encounter: Payer: Self-pay | Admitting: Cardiovascular Disease

## 2017-04-14 NOTE — Progress Notes (Signed)
Cardiology Office Note    Date:  04/14/2017   ID:  James [name], DOB 15-Jun-1960, MRN [id number]  PCP:  James Cruel, MD  Cardiologist:  James Majestic, MD   Initial sleep evaluation.  History of Present Illness:  James [name] is a 57 y.o. male who is status post a recent PCI to a totally occluded LAD.  He is referred for sleep evaluation.  James [name] has a history of obstructive sleep apnea which was initially diagnosed in May 2007 when he underwent a nocturnal polysomnogram which was interpreted by Dr. Annamaria [name].  At that time, he had severe sleep apnea with an AHI of 45.9 per hour; random AHI was 43.1 per hour.  He had very loud snoring with oxygen desaturation to a nadir of 72%.  Apparently, he was never started on CPAP therapy, but at some point had an oral appliance customized which he had worn intermittently but had not used in some time.  He exercises frequently and works out regularly at sport time.  On January 18 while doing a cardiac workout.  He began to notice substernal chest tightness.  He saw his PCP and was told to take antacids.  [facility] laboratory was drawn and he was later notified that troponins were elevated.  He presented to the hospital and underwent cardiac catheterization by [name] on January 18 in the early evening and was found to have total mid LAD occlusion which was successfully stented.  He was seen on 04/09/2017 by Almyra Deforest in the office follow-up and at that time due to his poor sleep.  He was advised to undergo a sleep evaluation and was scheduled to see me today.  However, later that night he again experienced recurrent chest pain, was rehospitalized and underwent repeat cardiac catheterization yesterday which revealed a widely patent stent.  He was discharged early this morning and presents to the office for further evaluation with me.  He admits to some anxiety, particularly now that he just had a heart attack.  He is concerned that he needs to get his  sleep apnea treated as soon as possible.  An echo Doppler study on 03/30/2017 showed an EF of 40-45%.  There was mild LVH.  There was septal and apical akinesis. James [name]  He admits to slowing awakening 1-2 times per night, nonrestorative sleep, and daytime sleepiness.  An Epworth Sleepiness Scale score was calculated in the office today, which endorsed at 17 and is consistent with excessive daytime sleepiness as shown below:  Epworth Sleepiness Scale: Situation   Chance of Dozing/Sleeping (0 = never , 1 = slight chance , 2 = moderate chance , 3 = high chance )   sitting and reading 3   watching TV 3   sitting inactive in a public place 3   being a passenger in a motor vehicle for an hour or more 1   lying down in the afternoon 3   sitting and talking to someone 0   sitting quietly after lunch (no alcohol) 3   while stopped for a few minutes in traffic as the driver 1   Total Score  17    He presents for evaluation  Past Medical History:  Diagnosis Date  . Acute systolic congestive heart failure ([place])   . Anemia    slight anemia with hepatitis tx  . Arthritis    oa  . 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  GERD (gastroesophageal reflux disease)   . Hepatitis C 04/29/2012   had savalta treatment 2 years ago, now cured  . Hyperlipidemia   . NSTEMI (non-ST elevated myocardial infarction) ([place]) 03/29/2017   1/19 PCI/DESx1 to mLAD, EF 45%  . Palpitations    at times     Past Surgical History:  Procedure Laterality Date  . ARTHROSCOPIC REPAIR ACL Right 15 years ago  . CORONARY STENT INTERVENTION N/A 03/29/2017   Procedure: CORONARY STENT INTERVENTION;  Surgeon: [name], James M, MD;  [facility]: [facility];  Service: Cardiovascular;  Laterality: N/A;  . CORONARY THROMBECTOMY N/A 03/29/2017   Procedure: Coronary Thrombectomy;  Surgeon: [name], James M, MD;  Location: [facility];  Service: Cardiovascular;  Laterality: N/A;  . LEFT HEART  CATH AND CORONARY ANGIOGRAPHY N/A 03/29/2017   Procedure: LEFT HEART CATH AND CORONARY ANGIOGRAPHY;  Surgeon: [name], James M, MD;  Location: [facility];  Service: Cardiovascular;  Laterality: N/A;  . LEFT HEART CATH AND CORONARY ANGIOGRAPHY N/A 04/10/2017   Procedure: LEFT HEART CATH AND CORONARY ANGIOGRAPHY;  Surgeon: [name], James M, MD;  Location: [facility];  Service: Cardiovascular;  Laterality: N/A;  . TOTAL HIP ARTHROPLASTY Right 01/11/2015   Procedure: RIGHT TOTAL HIP ARTHROPLASTY ANTERIOR APPROACH;  Surgeon: James Cancel, MD;  Location: [facility];  Service: Orthopedics;  Laterality: Right;    Current Medications: Outpatient Medications Prior to Visit  Medication Sig Dispense Refill  . ALPRAZolam (XANAX) 0.25 MG tablet Take 1 tablet (0.25 mg total) by mouth 2 (two) times daily as needed for anxiety. 20 tablet 0  . aspirin 81 MG tablet Take 1 tablet (81 mg total) by mouth daily. 90 tablet 3  . atorvastatin (LIPITOR) 80 MG tablet Take 1 tablet (80 mg total) by mouth daily at 6 PM. 30 tablet 6  . colchicine 0.6 MG tablet Take 1 tablet (0.6 mg total) by mouth 2 (two) times daily as needed (gout flare up). 60 tablet 6  . famotidine (PEPCID) 20 MG tablet Take 1 tablet (20 mg total) by mouth 2 (two) times daily. 10 tablet 0  . isosorbide mononitrate (IMDUR) 30 MG 24 hr tablet Take 0.5 tablets (15 mg total) by mouth daily. 30 tablet 0  . lisinopril (PRINIVIL,ZESTRIL) 2.5 MG tablet Take 1 tablet (2.5 mg total) by mouth daily. 30 tablet 6  . metoprolol succinate (TOPROL-XL) 25 MG 24 hr tablet Take 1 tablet (25 mg total) by mouth daily. 30 tablet 6  . nitroGLYCERIN (NITROSTAT) 0.4 MG SL tablet Place 1 tablet (0.4 mg total) under the tongue every 5 (five) minutes as needed. 25 tablet 12  . ticagrelor (BRILINTA) 90 MG TABS tablet Take 1 tablet (90 mg total) by mouth 2 (two) times daily. 60 tablet 6  . valACYclovir (VALTREX) 1000 MG tablet Take 1,000 mg by mouth daily as needed (for fever  blisters). Reported on 09/22/2015  2   No facility-administered medications prior to visit.      Allergies:   Patient has no known allergies.   Social History   Socioeconomic History  . Marital status: Single    Spouse name: None  . Number of children: None  . Years of education: None  . Highest education level: None  Social Needs  . Financial resource strain: None  . Food insecurity - worry: None  . Food insecurity - inability: None  . Transportation needs - medical: None  . Transportation needs - non-medical: None  Occupational History  .  Occupation:  Financial controller: [employer]  Tobacco Use  . Smoking status: Light Tobacco Smoker    Types: Cigars  . Smokeless tobacco: Never Used  . Tobacco comment: cigar  Substance and Sexual Activity  . Alcohol use: No  . Drug use: No  . Sexual activity: None  Other Topics Concern  . None  Social History Narrative  . None    Socially he is engaged to be remarried.  He is  previously married and has 2 daughters, ages 61 and 53 and a grandchild.  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   Family History:  The patient's family history includes Alzheimer's disease in his mother; CAD in his father; Heart failure in his father.  His mother is alive at age 89 and father at age 64.  His father had CAD and CABG revascularization.  He has one brother and one sister.  ROS General: Negative; No fevers, chills, or night sweats;  HEENT: Negative; No changes in vision or hearing, sinus congestion, difficulty swallowing Pulmonary: Negative; No cough, wheezing, shortness of breath, hemoptysis Cardiovascular: Status post recent non-STEMI/secondary to LAD occlusion GI: Negative; No nausea, vomiting, diarrhea, or abdominal pain GU: Negative; No dysuria, hematuria, or difficulty voiding Musculoskeletal: Negative; no myalgias, joint pain, or weakness Hematologic/Oncology:  Negative; no easy bruising, bleeding Endocrine: Negative; no heat/cold intolerance; no diabetes Neuro: Negative; no changes in balance, headaches Skin: Negative; No rashes or skin lesions Psychiatric: Negative; No behavioral problems, depression Sleep: Positive for severe OSA diagnosed in 2007; has an oral appliance, but has not been using.  Significant snoring, daytime sleepiness, hypersomnolence,; No bruxism, restless legs, hypnogognic hallucinations, no cataplexy Other comprehensive 14 point system review is negative.   PHYSICAL EXAM:   VS:  BP 100/78   Pulse 76   Ht 5' 10"  (1.778 m)   Wt 201 lb (91.2 kg)   BMI 28.84 kg/m     Repeat blood pressure by me 112/70  Wt Readings from Last 3 Encounters:  04/11/17 201 lb (91.2 kg)  04/11/17 199 lb 11.2 oz (90.6 kg)  04/09/17 202 lb (91.6 kg)    General: Alert, oriented, no distress.  Muscular. Skin: normal turgor, no rashes, warm and dry HEENT: Normocephalic, atraumatic. Pupils equal round and reactive to light; sclera anicteric; extraocular muscles intact; Funidi; without hemorrhages or exudates.  Disks flat. Nose without nasal septal hypertrophy Mouth/Parynx benign; Mallinpatti scale 3 Neck: No JVD, no carotid bruits; normal carotid upstroke Lungs: clear to ausculatation and percussion; no wheezing or rales Chest wall: without tenderness to palpitation Heart: PMI not displaced, RRR, s1 s2 normal, 1/6 systolic murmur, no diastolic murmur, no rubs, gallops, thrills, or heaves Abdomen: soft, nontender; no hepatosplenomehaly, BS+; abdominal aorta nontender and not dilated by palpation. Back: no CVA tenderness Pulses 2+ Musculoskeletal: full range of motion, normal strength, no joint deformities Extremities: no clubbing cyanosis or edema, Homan's sign negative  Neurologic: grossly nonfocal; Cranial nerves grossly wnl Psychologic: Normal mood and affect   Studies/Labs Reviewed:   EKG:  EKG is not ordered today.    Recent  Labs: BMP Latest Ref Rng & Units 04/11/2017 04/10/2017 04/10/2017  Glucose 65 - 99 mg/dL 102(H) 97 100(H)  BUN 6 - 20 mg/dL 21(H) 23(H) 27(H)  Creatinine 0.61 - 1.24 mg/dL 1.19 1.09 1.37(H)  Sodium 135 - 145 mmol/L 140 140 140  Potassium 3.5 - 5.1 mmol/L 4.7 4.5 3.7  Chloride 101 -  111 mmol/L 109 107 103  CO2 22 - 32 mmol/L 23 24 26   Calcium 8.9 - 10.3 mg/dL 8.9 9.1 9.4     Hepatic Function Latest Ref Rng & Units 04/01/2017 03/29/2017 03/29/2017  Total Protein 6.5 - 8.1 g/dL 5.9(L) 6.3(L) 7.3  Albumin 3.5 - 5.0 g/dL 3.2(L) 3.8 4.2  AST 15 - 41 U/L 45(H) 166(H) 97(H)  ALT 17 - 63 U/L 27 36 34  Alk Phosphatase 38 - 126 U/L 54 54 60  Total Bilirubin 0.3 - 1.2 mg/dL 0.6 0.9 0.4  Bilirubin, Direct 0.1 - 0.5 mg/dL - 0.2 0.1    CBC Latest Ref Rng & Units 04/10/2017 04/08/2017 03/30/2017  WBC 4.0 - 10.5 K/uL 6.9 9.1 8.7  Hemoglobin 13.0 - 17.0 g/dL 16.7 16.7 15.2  Hematocrit 39.0 - 52.0 % 49.4 49.7 45.6  Platelets 150 - 400 K/uL 235 257 215   Lab Results  Component Value Date   MCV 90.3 04/10/2017   MCV 90.0 04/08/2017   MCV 90.8 03/30/2017   No results found for: TSH Lab Results  Component Value Date   HGBA1C 5.5 03/29/2017     BNP No results found for: BNP  ProBNP No results found for: PROBNP   Lipid Panel     Component Value Date/Time   CHOL 155 03/30/2017 0251   TRIG 191 (H) 03/30/2017 0251   HDL 26 (L) 03/30/2017 0251   CHOLHDL 6.0 03/30/2017 0251   VLDL 38 03/30/2017 0251   LDLCALC 91 03/30/2017 0251     RADIOLOGY: Dg Chest 2 View  Result Date: 04/08/2017 CLINICAL DATA:  Chest pain, shortness of Breath EXAM: CHEST  2 VIEW COMPARISON:  03/29/2017 FINDINGS: Heart is upper limits normal in size. Lungs are clear. No effusions or acute bony abnormality. IMPRESSION: No active cardiopulmonary disease. Electronically Signed   By: Rolm Baptise M.D.   On: 04/08/2017 21:18   Dg Chest 2 View  Result Date: 03/29/2017 CLINICAL DATA:  Chest pain EXAM: CHEST  2 VIEW  COMPARISON:  None. FINDINGS: Lungs are clear. Heart size and pulmonary vascularity are normal. No adenopathy. No pneumothorax. No bone lesions. IMPRESSION: No edema or consolidation. Electronically Signed   By: Lowella Grip III M.D.   On: 03/29/2017 14:09   Dg Chest Portable 1 View  Result Date: 04/10/2017 CLINICAL DATA:  Central chest pain radiating to the neck and left arm. Lightheadedness. EXAM: PORTABLE CHEST 1 VIEW COMPARISON:  04/08/2017 FINDINGS: Shallow inspiration. Normal heart size and pulmonary vascularity. No focal airspace disease or consolidation in the lungs. No blunting of costophrenic angles. No pneumothorax. Mediastinal contours appear intact. IMPRESSION: No active disease. Electronically Signed   By: Lucienne Capers M.D.   On: 04/10/2017 03:33     Additional studies/ records that were reviewed today include:  I reviewed the patient's remote polysomnogram of 2007.  I reviewed his most recent hospitalizations and cardiac catheterizations as well as office note.   ASSESSMENT:    1. OSA (obstructive sleep apnea)   2. NSTEMI (non-ST elevated myocardial infarction) ([place]); status post DES stenting to totally occluded mid LAD 03/29/2017   3. Frequent nocturnal awakening   4. Snoring   5. Excessive daytime sleepiness   6. Hyperlipidemia LDL goal <70      PLAN:  Mr. James [name] is a 57 year old Caucasian male who recently developed chest pain while working out at [employer] and was found to have total occlusion of his mid LAD.  He underwent delayed but successful revascularization with DES  stenting.  Retrospectively, he has a history of obstructive sleep apnea documented since 2007.  At that time his sleep apnea was severe.  It does not appear he ever initiated CPAP therapy, but ultimately received a customized oral appliance which he had not been  wearing.  He is now 10 days following his acute coronary syndrome and due to recurrent symptomatology with chest fullness  occurring at 2 AM while sleeping he had undergone repeat cardiac catheterization yesterday which showed a patent mid LAD stent.  He has loud snoring, nonrestorative sleep, frequent awakenings, and his Epworth Sleepiness Scale score is significantly elevated at 17.  I have recommended expeditious split-night protocol to reassess his sleep apnea and initiate CPAP therapy as soon as possible.  In the interim, I recommended strongly that he initiate his customized oral appliance until he can get started on CPAP therapy.  I long discussion with him regarding the adverse consequences of untreated sleep apnea with reference to his cardiovascular health, including increased inflammation, hypertension, arrhythmia development, and nocturnal hypoxemia contributing to potential ischemia.  Presently, he is sleeping 8 hours per night and goes to bed at 11 and wakes up at 7 AM.  His blood pressure today is stable.  He is on isosorbide 15 mg in addition to lisinopril 2.5 mg and Toprol-XL 25 mg daily.  He is on DAPT with Brilinta and aspirin.  He is now on atorvastatin 80 mg daily with target LDL less than 70.  I also reviewed with him in detail his catheterization and echocardiographic findings.  He is very concerned on whether or not he has residual LV dysfunction.  He will be seeing [name] in follow-up of his Cardiologic care.  I will see him in several months after his CPAP set up and further recommendations will be made at that time.   Medication Adjustments/Labs and Tests Ordered: Current medicines are reviewed at length with the patient today.  Concerns regarding medicines are outlined above.  Medication changes, Labs and Tests ordered today are listed in the Patient Instructions below. Patient Instructions  Medication Instructions:  Your physician recommends that you continue on your current medications as directed. Please refer to the Current Medication list given to you today.  Testing/Procedures: Your  physician has recommended that you have a sleep study ASAP. This test records several body functions during sleep, including: brain activity, eye movement, oxygen and carbon dioxide blood levels, heart rate and rhythm, breathing rate and rhythm, the flow of air through your mouth and nose, snoring, body muscle movements, and chest and belly movement. --this must be pre-approved by insurance prior to scheduling.  Once approved someone will call to schedule this test.    Follow-Up: Your physician recommends that you schedule a follow-up appointment in: 3 months with [name] (sleep clinic).   Any Other Special Instructions Will Be Listed Below (If Applicable).     If you need a refill on your cardiac medications before your next appointment, please call your pharmacy.     Time spent: 45 minutes of which greater than 50% of the time was face-to-face with the patient and his fiance.  Signed, James Majestic, MD  04/14/2017 11:37 AM    [facility] [address] [place], [place], [place]  [address] Phone: [phone]

## 2017-04-15 ENCOUNTER — Other Ambulatory Visit: Payer: BLUE CROSS/BLUE SHIELD

## 2017-04-15 ENCOUNTER — Telehealth: Payer: Self-pay | Admitting: Cardiology

## 2017-04-15 ENCOUNTER — Ambulatory Visit: Payer: BLUE CROSS/BLUE SHIELD | Admitting: Oncology

## 2017-04-15 NOTE — Telephone Encounter (Signed)
New Message    Jenny Reichmann is calling on behalf of Dez. He is due to start cardiac rehab and needs a release form sent to the location in [place] in order to have rehab services there. The name of the facility in [place] is [facility]. The contact person name is Yvone Neu [phone]. They are also inquiring about the sleep study test. Please call to discuss.

## 2017-04-15 NOTE — Telephone Encounter (Signed)
Returned call to [place], fiance (DPR). Patient will start cardiac rehab in [place] but will need additional cardiac rehab for when he is [place] - he lives there part time as well. Explained will send message to MD for order. The rehab center is at [facility]. She did not have the fax #. Jenny Reichmann also inquired about sleep study. Explained this will need to go to pre-cert for insurance approval and then the sleep center will call patient to arrange appointment. Will send message to pre-cert pool to expedite process and AVS stated sleep study should be ASAP.

## 2017-04-15 NOTE — Telephone Encounter (Signed)
Referral has been sent to [facility], fiance is aware (spoke to checkout).  Per sleep precert, this was submitted today and may take a couple days to get a response.

## 2017-04-17 ENCOUNTER — Telehealth: Payer: Self-pay | Admitting: Cardiovascular Disease

## 2017-04-17 ENCOUNTER — Telehealth: Payer: Self-pay | Admitting: Internal Medicine

## 2017-04-17 NOTE — Telephone Encounter (Signed)
Scheduled appt per 2/4 sch message - patient aware and did not want apt scheduled. Per patient he doesn't want the appts scheduled Cardiologist keeping an eye on his levels.

## 2017-04-17 NOTE — Telephone Encounter (Signed)
Patient's finance called back about insurance approval so that they can get the sleep study completed. She is concerned about his worsening sleep apnea. Message routed to provider.

## 2017-04-17 NOTE — Telephone Encounter (Signed)
New messages   James [name] calling has question regarding sleep study

## 2017-04-18 ENCOUNTER — Telehealth: Payer: Self-pay | Admitting: *Deleted

## 2017-04-18 ENCOUNTER — Telehealth (HOSPITAL_COMMUNITY): Payer: Self-pay

## 2017-04-18 NOTE — Telephone Encounter (Signed)
Received referral back. We had a cancellation in our orientation schedule. Called patient to inform him that he could begin 04/25/2017 here at the [facility] location which he was really wanting. Patient is scheduled 04/25/2017 at 8:45am. Patient will attend the 9:45am exc class.

## 2017-04-18 NOTE — Telephone Encounter (Signed)
Returned a cal to patient informing him per Chyrel Masson that the precert for his Sleep study had to be resubmitted. He is very upset that his insurance company [employer]) is taking so long to get this done. He requests to know exactly what they said and why the request  had to be resubmitted. He requests to speak with the person that actually has the precert "in their hands."  I also informed the patient that once the insurance has approved the study, it will still take approx anywhere from 6-8 weeks to have the study unless being placed on a cancellation list. Patient does not want to wait to get tested. If  does not have ASAP appointment he wants to be referred somewhere else to get the study.

## 2017-04-18 NOTE — Telephone Encounter (Signed)
Amy what's the status of this sleep study approval?

## 2017-04-19 ENCOUNTER — Telehealth (HOSPITAL_COMMUNITY): Payer: Self-pay | Admitting: Pharmacist

## 2017-04-19 DIAGNOSIS — I252 Old myocardial infarction: Secondary | ICD-10-CM | POA: Diagnosis not present

## 2017-04-19 DIAGNOSIS — F419 Anxiety disorder, unspecified: Secondary | ICD-10-CM | POA: Diagnosis not present

## 2017-04-19 DIAGNOSIS — H00021 Hordeolum internum right upper eyelid: Secondary | ICD-10-CM | POA: Diagnosis not present

## 2017-04-19 DIAGNOSIS — Z955 Presence of coronary angioplasty implant and graft: Secondary | ICD-10-CM | POA: Diagnosis not present

## 2017-04-19 NOTE — Telephone Encounter (Signed)
Cardiac Rehab Medication Review by a Pharmacist  Does the patient  feel that his/her medications are working for him/her?  Yes, overall but is unsure if blood pressure is better controlled.  Has the patient been experiencing any side effects to the medications prescribed?  Yes, patient feels flush, light headedness, dizzy, and fatigue. Most likely from the metoprolol and Imdur.  Does the patient measure his/her own blood pressure or blood glucose at home?  Yes, tends to run around 120-125/70-80  Does the patient have any problems obtaining medications due to transportation or finances?  No  Understanding of regimen: good Understanding of indications: good Potential of compliance: good   Pharmacist comments: Patient is very anxious and has been worried about his side effects. He claims that he is always worried about a heart attack, but I explained that his medications could be causing him to feel flush and fatigue. He was so grateful for this and said that no one had ever told him this before and he said that he feels better knowing that. No other questions or concerns.  Nida Boatman, PharmD PGY1 Acute Care Pharmacy Resident Pager: [phone] 04/19/2017 1:27 PM

## 2017-04-22 ENCOUNTER — Telehealth: Payer: Self-pay | Admitting: *Deleted

## 2017-04-22 ENCOUNTER — Telehealth: Payer: Self-pay | Admitting: Cardiology

## 2017-04-22 ENCOUNTER — Telehealth: Payer: Self-pay | Admitting: Cardiovascular Disease

## 2017-04-22 DIAGNOSIS — I5021 Acute systolic (congestive) heart failure: Secondary | ICD-10-CM

## 2017-04-22 DIAGNOSIS — I214 Non-ST elevation (NSTEMI) myocardial infarction: Secondary | ICD-10-CM

## 2017-04-22 NOTE — Telephone Encounter (Signed)
x

## 2017-04-22 NOTE — Telephone Encounter (Signed)
Returned call to fiance (ok per DPR)-advised in lab has been denied and [name] must do peer to peer or letter to appeal this decision.  Made aware [name] is in clinic all day today, will review with him at end of day.   Advised I would update her tomorrow on process.    She is aware and verbalized understanding.

## 2017-04-22 NOTE — Telephone Encounter (Signed)
Pt's fiance Synthia Innocent calling regarding pt needing a Cardiologist near [place] in [place]. He wants to establish with Dr. [name] [phone], [place] [address]. They need a referral from our office as well as medical records, pt instructed to come by office to sign medical records release. If we can put the referral in, they can get established.

## 2017-04-22 NOTE — Telephone Encounter (Signed)
Patient came in as a walk in. He came in to sign medical release forms to Dr. Hedwig Morton in [place], [place]. Per [name] it is okay to refer him to this cardiologist. A referral has been ordered.   The patient lives here in town and in [place]. He needs a second cardiologist in [place] for when he is living in that town. He will still remain with [name] when he is here.

## 2017-04-22 NOTE — Telephone Encounter (Signed)
Pt's girlfriend calling regarding pt waiting to be scheduled for a sleep study. Pt's insurance will cover a home study but [name] only wants study done in a facility, pt talked to someone a few weeks ago and said she would call him right back and they haven't heard anything. He is getting worse and would like to get study scheduled asap. Pls call [facility]

## 2017-04-23 ENCOUNTER — Telehealth: Payer: Self-pay | Admitting: Cardiology

## 2017-04-23 NOTE — Telephone Encounter (Signed)
Called patient and gave him the date and time of his appointment with Dr. Johnney Killian in [place].  He was pleased with the news and will be contacting them.

## 2017-04-23 NOTE — Telephone Encounter (Signed)
Peer to peer will be done tomorrow when [name] is back in office.   Fiance aware and verbalized understanding.  Appreciated the call.  Will update her again tomorrow.

## 2017-04-23 NOTE — Telephone Encounter (Signed)
Records were faxed today 04-23-17 at 9:30 a.m. To Dr. [name] at [phone].  I called Dr. Samson Frederic office and scheduled the patient with Dr. Johnney Killian on 05-08-17 at 1:40 p.m.  I also faxed his insurance cards to their office.

## 2017-04-23 NOTE — Telephone Encounter (Signed)
Called and left message with [employer] Preauthorization Department to call back to schedule peer-to-peer review for sleep study per [name].

## 2017-04-24 ENCOUNTER — Encounter (HOSPITAL_COMMUNITY): Payer: BLUE CROSS/BLUE SHIELD

## 2017-04-24 NOTE — Telephone Encounter (Signed)
Called [facility] department to do peer to peer with [name].   [name] spoke to the doctor and now informed case is "closed" and we would have to start a "new" appeals process.  Nurse spoke to doctor to inform him that message had been left on 2/12 as well as spoke to someone and was informed that this is the process that needed to take place (to call with MD and would be transferred to a MD to do peer to peer).   The doctor stated the case is closed and this was all they could do for [place] and hung up.     Called back and spoke to someone in regards, have left another message with [facility] to call to do appeal (that must be done once case is closed).   Was informed they will call back to get more information and this should be done in 1-2 days.   Spoke to patient and fiance and are aware that peer to peer was attempted and informed that another message has been left.   If not able to do Friday, will proceed with HST in order to get patient on therapy ASAP (per [name]).      They are aware and verbalized understanding.

## 2017-04-25 ENCOUNTER — Encounter (HOSPITAL_COMMUNITY)
Admission: RE | Admit: 2017-04-25 | Discharge: 2017-04-25 | Disposition: A | Discharge status: Home / Self Care | Payer: BLUE CROSS/BLUE SHIELD | Source: Ambulatory Visit | Attending: Cardiology | Admitting: Cardiology

## 2017-04-25 ENCOUNTER — Ambulatory Visit: Payer: BLUE CROSS/BLUE SHIELD | Admitting: Physician Assistant

## 2017-04-25 ENCOUNTER — Encounter (HOSPITAL_COMMUNITY): Payer: Self-pay

## 2017-04-25 VITALS — Ht 68.0 in | Wt 199.3 lb

## 2017-04-25 DIAGNOSIS — Z8619 Personal history of other infectious and parasitic diseases: Secondary | ICD-10-CM | POA: Diagnosis not present

## 2017-04-25 DIAGNOSIS — Z85828 Personal history of other malignant neoplasm of skin: Secondary | ICD-10-CM | POA: Diagnosis not present

## 2017-04-25 DIAGNOSIS — K219 Gastro-esophageal reflux disease without esophagitis: Secondary | ICD-10-CM | POA: Insufficient documentation

## 2017-04-25 DIAGNOSIS — E785 Hyperlipidemia, unspecified: Secondary | ICD-10-CM | POA: Insufficient documentation

## 2017-04-25 DIAGNOSIS — Z7902 Long term (current) use of antithrombotics/antiplatelets: Secondary | ICD-10-CM | POA: Insufficient documentation

## 2017-04-25 DIAGNOSIS — Z79899 Other long term (current) drug therapy: Secondary | ICD-10-CM | POA: Diagnosis not present

## 2017-04-25 DIAGNOSIS — F17290 Nicotine dependence, other tobacco product, uncomplicated: Secondary | ICD-10-CM | POA: Diagnosis not present

## 2017-04-25 DIAGNOSIS — Z955 Presence of coronary angioplasty implant and graft: Secondary | ICD-10-CM | POA: Diagnosis not present

## 2017-04-25 DIAGNOSIS — I214 Non-ST elevation (NSTEMI) myocardial infarction: Secondary | ICD-10-CM

## 2017-04-25 DIAGNOSIS — M1990 Unspecified osteoarthritis, unspecified site: Secondary | ICD-10-CM | POA: Diagnosis not present

## 2017-04-25 DIAGNOSIS — Z7982 Long term (current) use of aspirin: Secondary | ICD-10-CM | POA: Insufficient documentation

## 2017-04-25 NOTE — Telephone Encounter (Signed)
Spoke to fiance and made aware that we have not received call back from [facility].   Aware I am OOO tomorrow but [name] is here and sleep coordinator is aware of the situation/plan.   She verbalized understanding and appreciated the update.

## 2017-04-25 NOTE — Telephone Encounter (Signed)
-----   Message from Peter M Martinique, MD sent at 04/25/2017 12:31 PM EST ----- Regarding: RE: Request to do strength training I think he needs to scale it back a bit for now. Let his heart recover. I probably wouldn't pursue strength training for 4 weeks.  [name] MD, [facility]  ----- Message ----- From: Dorna Bloom D Sent: 04/25/2017  11:57 AM To: Rowe Pavy, RN, Peter M Martinique, MD Subject: Request to do strength training                Patient was oriented to Cardiac Rehab on 04/25/17. Pt is interested in doing strength training.S/p 03/28/17- DES LAD,  thrombectomy , recent ED visit for chest pain on 04/08/17 and patent sent on 04/10/17.  Pt has a f/u visit with Dr. Isaac Laud on 04/30/17 at 8:00am and will start exercise in cardiac rehab on Wednesday 05/01/17.  If you feel this patient is appropriate to begin strength training please f/u with cardiac rehab staff.   Thank you in advance!   Dorna Bloom, MS,ACSM RCEP

## 2017-04-25 NOTE — Progress Notes (Signed)
James [name] 57 y.o. male DOB: 1960-05-13 MRN: [id number]      Nutrition Note  1. NSTEMI (non-ST elevated myocardial infarction) ([place])   2. Status post coronary artery stent placement    Past Medical History:  Diagnosis Date  . Acute systolic congestive heart failure ([place])   . Anemia    slight anemia with hepatitis tx  . Arthritis    oa  . 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 GERD (gastroesophageal reflux disease)   . Hepatitis C 04/29/2012   had savalta treatment 2 years ago, now cured  . Hyperlipidemia   . NSTEMI (non-ST elevated myocardial infarction) ([place]) 03/29/2017   1/19 PCI/DESx1 to mLAD, EF 45%  . Palpitations    at times    Meds reviewed.   HT: Ht Readings from Last 1 Encounters:  04/25/17 5\' 8"  (1.727 m)    WT: Wt Readings from Last 3 Encounters:  04/25/17 199 lb 4.7 oz (90.4 kg)  04/11/17 201 lb (91.2 kg)  04/11/17 199 lb 11.2 oz (90.6 kg)     BMI 30.31   Current tobacco use? Yes       Labs:  Lipid Panel     Component Value Date/Time   CHOL 155 03/30/2017 0251   TRIG 191 (H) 03/30/2017 0251   HDL 26 (L) 03/30/2017 0251   CHOLHDL 6.0 03/30/2017 0251   VLDL 38 03/30/2017 0251   LDLCALC 91 03/30/2017 0251    Lab Results  Component Value Date   HGBA1C 5.5 03/29/2017   CBG (last 3)  No results for input(s): GLUCAP in the last 72 hours.  Nutrition Note Spoke with pt. Nutrition plan and goals reviewed with pt. Pt is following Step 2 of the Therapeutic Lifestyle Changes diet. Pt expressed understanding of the information reviewed. Pt aware of nutrition education classes offered and plans on attending nutrition classes.  Nutrition Diagnosis Food-and nutrition-related knowledge deficit related to lack of exposure to information as related to diagnosis of: ? CVD   Nutrition Intervention ? Pt's individual nutrition plan and goals reviewed with pt. ? Pt given handouts for: ? Nutrition I class ? Nutrition II  class   Nutrition Goal(s):  ? Pt did not want to set a nutrition goal at this time.  Plan:  Pt to attend nutrition classes ? Nutrition I ? Nutrition II ? Portion Distortion  Will provide client-centered nutrition education as part of interdisciplinary care.   Monitor and evaluate progress toward nutrition goal with team.  Derek Mound, M.Ed, RD, LDN, CDE 04/25/2017 3:33 PM

## 2017-04-25 NOTE — Telephone Encounter (Signed)
Message sent to [facility] RN in cardiac rehab.

## 2017-04-25 NOTE — Progress Notes (Signed)
Cardiac Individual Treatment Plan  Patient Details  Name: James [name] MRN: [id number] Date of Birth: August 29, 1960 Referring Provider:     CARDIAC REHAB PHASE II ORIENTATION from 04/25/2017 in [place]  Referring Provider  Martinique, Peter MD       Initial Encounter Date:    CARDIAC REHAB PHASE II ORIENTATION from 04/25/2017 in [place]  Date  04/25/17  Referring Provider  Martinique, Peter MD       Visit Diagnosis: NSTEMI (non-ST elevated myocardial infarction) [facility])  Status post coronary artery stent placement  Patient's Home Medications on Admission:  Current Outpatient Medications:  .  ALPRAZolam (XANAX) 0.25 MG tablet, Take 1 tablet (0.25 mg total) by mouth 2 (two) times daily as needed for anxiety., Disp: 20 tablet, Rfl: 0 .  aspirin 81 MG tablet, Take 1 tablet (81 mg total) by mouth daily., Disp: 90 tablet, Rfl: 3 .  atorvastatin (LIPITOR) 80 MG tablet, Take 1 tablet (80 mg total) by mouth daily at 6 PM., Disp: 30 tablet, Rfl: 6 .  colchicine 0.6 MG tablet, Take 1 tablet (0.6 mg total) by mouth 2 (two) times daily as needed (gout flare up)., Disp: 60 tablet, Rfl: 6 .  famotidine (PEPCID) 20 MG tablet, Take 1 tablet (20 mg total) by mouth 2 (two) times daily. (Patient not taking: Reported on 04/19/2017), Disp: 10 tablet, Rfl: 0 .  isosorbide mononitrate (IMDUR) 30 MG 24 hr tablet, Take 0.5 tablets (15 mg total) by mouth daily., Disp: 30 tablet, Rfl: 0 .  lisinopril (PRINIVIL,ZESTRIL) 2.5 MG tablet, Take 1 tablet (2.5 mg total) by mouth daily., Disp: 30 tablet, Rfl: 6 .  metoprolol succinate (TOPROL-XL) 25 MG 24 hr tablet, Take 1 tablet (25 mg total) by mouth daily., Disp: 30 tablet, Rfl: 6 .  nitroGLYCERIN (NITROSTAT) 0.4 MG SL tablet, Place 1 tablet (0.4 mg total) under the tongue every 5 (five) minutes as needed., Disp: 25 tablet, Rfl: 12 .  ticagrelor (BRILINTA) 90 MG TABS tablet, Take 1 tablet (90 mg total) by mouth  2 (two) times daily., Disp: 60 tablet, Rfl: 6 .  valACYclovir (VALTREX) 1000 MG tablet, Take 1,000 mg by mouth daily as needed (for fever blisters). Reported on 09/22/2015, Disp: , Rfl: 2  Past Medical History: Past Medical History:  Diagnosis Date  . Acute systolic congestive heart failure ([place])   . Anemia    slight anemia with hepatitis tx  . Arthritis    oa  . 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 GERD (gastroesophageal reflux disease)   . Hepatitis C 04/29/2012   had savalta treatment 2 years ago, now cured  . Hyperlipidemia   . NSTEMI (non-ST elevated myocardial infarction) ([place]) 03/29/2017   1/19 PCI/DESx1 to mLAD, EF 45%  . Palpitations    at times     Tobacco Use: Social History   Tobacco Use  Smoking Status Light Tobacco Smoker  . Types: Cigars  Smokeless Tobacco Never Used  Tobacco Comment   cigar    Labs: Recent Review Flowsheet Data    Labs for ITP Cardiac and Pulmonary Rehab Latest Ref Rng & Units 03/29/2017 03/30/2017   Cholestrol 0 - 200 mg/dL - 155   LDLCALC 0 - 99 mg/dL - 91   HDL >40 mg/dL - 26(L)   Trlycerides <150 mg/dL - 191(H)   Hemoglobin A1c 4.8 - 5.6 % 5.5 -  Capillary Blood Glucose: No results found for: GLUCAP   Exercise Target Goals: Date: 04/25/17  Exercise Program Goal: Individual exercise prescription set using results from initial 6 min walk test and THRR while considering  patient's activity barriers and safety.   Exercise Prescription Goal: Initial exercise prescription builds to 30-45 minutes a day of aerobic activity, 2-3 days per week.  Home exercise guidelines will be given to patient during program as part of exercise prescription that the participant will acknowledge.  Activity Barriers & Risk Stratification: Activity Barriers & Cardiac Risk Stratification - 04/25/17 0925      Activity Barriers & Cardiac Risk Stratification   Activity Barriers  Right Hip Replacement Right  Tricep Severed       6 Minute Walk: 6 Minute Walk    Row Name 04/25/17 1149         6 Minute Walk   Phase  Initial     Distance  1670 feet     Walk Time  6 minutes     # of Rest Breaks  0     MPH  3.16     METS  3.96     RPE  8     Perceived Dyspnea   0     VO2 Peak  13.89     Symptoms  No     Resting HR  87 bpm     Resting BP  104/68     Resting Oxygen Saturation   96 %     Exercise Oxygen Saturation  during 6 min walk  100 %     Max Ex. HR  100 bpm     Max Ex. BP  120/72     2 Minute Post BP  98/62        Oxygen Initial Assessment:   Oxygen Re-Evaluation:   Oxygen Discharge (Final Oxygen Re-Evaluation):   Initial Exercise Prescription: Initial Exercise Prescription - 04/25/17 1100      Date of Initial Exercise RX and Referring Provider   Date  04/25/17    Referring Provider  Martinique, Peter MD       Treadmill   MPH  3    Grade  1    Minutes  10    METs  3.71      Bike   Level  1.3    Minutes  10    METs  3.73      Elliptical   Level  1    Speed  1    Minutes  10    METs  3.7      Prescription Details   Frequency (times per week)  3x    Duration  Progress to 45 minutes of aerobic exercise without signs/symptoms of physical distress      Intensity   THRR 40-80% of Max Heartrate  65-130    Ratings of Perceived Exertion  11-15    Perceived Dyspnea  0-4      Progression   Progression  Continue progressive overload as per policy without signs/symptoms or physical distress.      Resistance Training   Training Prescription  Yes    Weight  6lbs    Reps  10-15       Perform Capillary Blood Glucose checks as needed.  Exercise Prescription Changes:   Exercise Comments:   Exercise Goals and Review: Exercise Goals    Row Name 04/25/17 1150             Exercise Goals  Increase Physical Activity  Yes       Intervention  Provide advice, education, support and counseling about physical activity/exercise needs.;Develop an  individualized exercise prescription for aerobic and resistive training based on initial evaluation findings, risk stratification, comorbidities and participant's personal goals.       Expected Outcomes  Short Term: Attend rehab on a regular basis to increase amount of physical activity.;Long Term: Add in home exercise to make exercise part of routine and to increase amount of physical activity.;Long Term: Exercising regularly at least 3-5 days a week.       Increase Strength and Stamina  Yes       Intervention  Provide advice, education, support and counseling about physical activity/exercise needs.;Develop an individualized exercise prescription for aerobic and resistive training based on initial evaluation findings, risk stratification, comorbidities and participant's personal goals.       Expected Outcomes  Short Term: Increase workloads from initial exercise prescription for resistance, speed, and METs.;Short Term: Perform resistance training exercises routinely during rehab and add in resistance training at home;Long Term: Improve cardiorespiratory fitness, muscular endurance and strength as measured by increased METs and functional capacity (6MWT)       Able to understand and use rate of perceived exertion (RPE) scale  Yes       Intervention  Provide education and explanation on how to use RPE scale       Expected Outcomes  Short Term: Able to use RPE daily in rehab to express subjective intensity level;Long Term:  Able to use RPE to guide intensity level when exercising independently       Knowledge and understanding of Target Heart Rate Range (THRR)  Yes       Intervention  Provide education and explanation of THRR including how the numbers were predicted and where they are located for reference       Expected Outcomes  Short Term: Able to state/look up THRR;Short Term: Able to use daily as guideline for intensity in rehab;Long Term: Able to use THRR to govern intensity when exercising  independently       Able to check pulse independently  Yes       Intervention  Provide education and demonstration on how to check pulse in carotid and radial arteries.;Review the importance of being able to check your own pulse for safety during independent exercise       Expected Outcomes  Short Term: Able to explain why pulse checking is important during independent exercise;Long Term: Able to check pulse independently and accurately       Understanding of Exercise Prescription  Yes       Intervention  Provide education, explanation, and written materials on patient's individual exercise prescription       Expected Outcomes  Short Term: Able to explain program exercise prescription;Long Term: Able to explain home exercise prescription to exercise independently          Exercise Goals Re-Evaluation :    Discharge Exercise Prescription (Final Exercise Prescription Changes):   Nutrition:  Target Goals: Understanding of nutrition guidelines, daily intake of sodium 1500mg , cholesterol 200mg , calories 30% from fat and 7% or less from saturated fats, daily to have 5 or more servings of fruits and vegetables.  Biometrics: Pre Biometrics - 04/25/17 1150      Pre Biometrics   Height  5\' 8"  (1.727 m)    Weight  199 lb 4.7 oz (90.4 kg)    Waist Circumference  39 inches  Hip Circumference  40.5 inches    Waist to Hip Ratio  0.96 %    BMI (Calculated)  30.31    Triceps Skinfold  9 mm    % Body Fat  25.6 %    Grip Strength  39 kg    Flexibility  13.5 in    Single Leg Stand  12.45 seconds        Nutrition Therapy Plan and Nutrition Goals:   Nutrition Assessments:   Nutrition Goals Re-Evaluation:   Nutrition Goals Re-Evaluation:   Nutrition Goals Discharge (Final Nutrition Goals Re-Evaluation):   Psychosocial: Target Goals: Acknowledge presence or absence of significant depression and/or stress, maximize coping skills, provide positive support system. Participant is able  to verbalize types and ability to use techniques and skills needed for reducing stress and depression.  Initial Review & Psychosocial Screening: Initial Psych Review & Screening - 04/25/17 1153      Family Dynamics   Good Support System?  -- Pt feels supported by his family and fiance.  Pt plans to get married on 3/2.      Barriers   Psychosocial barriers to participate in program  The patient should benefit from training in stress management and relaxation.      Screening Interventions   Interventions  Encouraged to exercise    Expected Outcomes  Long Term goal: The participant improves quality of Life and PHQ9 Scores as seen by post scores and/or verbalization of changes;Long Term Goal: Stressors or current issues are controlled or eliminated.;Short Term goal: Utilizing psychosocial counselor, staff and physician to assist with identification of specific Stressors or current issues interfering with healing process. Setting desired goal for each stressor or current issue identified.       Quality of Life Scores: Quality of Life - 04/25/17 1151      Quality of Life Scores   Health/Function Pre  18.88 %    Socioeconomic Pre  29.14 %    Psych/Spiritual Pre  27.43 %    Family Pre  30 %    GLOBAL Pre  24.73 %      Scores of 19 and below usually indicate a poorer quality of life in these areas.  A difference of  2-3 points is a clinically meaningful difference.  A difference of 2-3 points in the total score of the Quality of Life Index has been associated with significant improvement in overall quality of life, self-image, physical symptoms, and general health in studies assessing change in quality of life.  PHQ-9: Recent Review Flowsheet Data    There is no flowsheet data to display.     Interpretation of Total Score  Total Score Depression Severity:  1-4 = Minimal depression, 5-9 = Mild depression, 10-14 = Moderate depression, 15-19 = Moderately severe depression, 20-27 = Severe  depression   Psychosocial Evaluation and Intervention:   Psychosocial Re-Evaluation:   Psychosocial Discharge (Final Psychosocial Re-Evaluation):   Vocational Rehabilitation: Provide vocational rehab assistance to qualifying candidates.   Vocational Rehab Evaluation & Intervention:   Education: Education Goals: Education classes will be provided on a weekly basis, covering required topics. Participant will state understanding/return demonstration of topics presented.  Learning Barriers/Preferences: Learning Barriers/Preferences - 04/25/17 9629      Learning Barriers/Preferences   Learning Barriers  Sight    Learning Preferences  Skilled Demonstration;Individual Instruction;Verbal Instruction       Education Topics: Count Your Pulse:  -Group instruction provided by verbal instruction, demonstration, patient participation and written materials to support subject.  Instructors address importance of being able to find your pulse and how to count your pulse when at home without a heart monitor.  Patients get hands on experience counting their pulse with staff help and individually.   Heart Attack, Angina, and Risk Factor Modification:  -Group instruction provided by verbal instruction, video, and written materials to support subject.  Instructors address signs and symptoms of angina and heart attacks.    Also discuss risk factors for heart disease and how to make changes to improve heart health risk factors.   Functional Fitness:  -Group instruction provided by verbal instruction, demonstration, patient participation, and written materials to support subject.  Instructors address safety measures for doing things around the house.  Discuss how to get up and down off the floor, how to pick things up properly, how to safely get out of a chair without assistance, and balance training.   Meditation and Mindfulness:  -Group instruction provided by verbal instruction, patient  participation, and written materials to support subject.  Instructor addresses importance of mindfulness and meditation practice to help reduce stress and improve awareness.  Instructor also leads participants through a meditation exercise.    Stretching for Flexibility and Mobility:  -Group instruction provided by verbal instruction, patient participation, and written materials to support subject.  Instructors lead participants through series of stretches that are designed to increase flexibility thus improving mobility.  These stretches are additional exercise for major muscle groups that are typically performed during regular warm up and cool down.   Hands Only CPR:  -Group verbal, video, and participation provides a basic overview of AHA guidelines for community CPR. Role-play of emergencies allow participants the opportunity to practice calling for help and chest compression technique with discussion of AED use.   Hypertension: -Group verbal and written instruction that provides a basic overview of hypertension including the most recent diagnostic guidelines, risk factor reduction with self-care instructions and medication management.    Nutrition I class: Heart Healthy Eating:  -Group instruction provided by PowerPoint slides, verbal discussion, and written materials to support subject matter. The instructor gives an explanation and review of the Therapeutic Lifestyle Changes diet recommendations, which includes a discussion on lipid goals, dietary fat, sodium, fiber, plant stanol/sterol esters, sugar, and the components of a well-balanced, healthy diet.   Nutrition II class: Lifestyle Skills:  -Group instruction provided by PowerPoint slides, verbal discussion, and written materials to support subject matter. The instructor gives an explanation and review of label reading, grocery shopping for heart health, heart healthy recipe modifications, and ways to make healthier choices when eating  out.   Diabetes Question & Answer:  -Group instruction provided by PowerPoint slides, verbal discussion, and written materials to support subject matter. The instructor gives an explanation and review of diabetes co-morbidities, pre- and post-prandial blood glucose goals, pre-exercise blood glucose goals, signs, symptoms, and treatment of hypoglycemia and hyperglycemia, and foot care basics.   Diabetes Blitz:  -Group instruction provided by PowerPoint slides, verbal discussion, and written materials to support subject matter. The instructor gives an explanation and review of the physiology behind type 1 and type 2 diabetes, diabetes medications and rational behind using different medications, pre- and post-prandial blood glucose recommendations and Hemoglobin A1c goals, diabetes diet, and exercise including blood glucose guidelines for exercising safely.    Portion Distortion:  -Group instruction provided by PowerPoint slides, verbal discussion, written materials, and food models to support subject matter. The instructor gives an explanation of serving size versus portion size,  changes in portions sizes over the last 20 years, and what consists of a serving from each food group.   Stress Management:  -Group instruction provided by verbal instruction, video, and written materials to support subject matter.  Instructors review role of stress in heart disease and how to cope with stress positively.     Exercising on Your Own:  -Group instruction provided by verbal instruction, power point, and written materials to support subject.  Instructors discuss benefits of exercise, components of exercise, frequency and intensity of exercise, and end points for exercise.  Also discuss use of nitroglycerin and activating EMS.  Review options of places to exercise outside of rehab.  Review guidelines for sex with heart disease.   Cardiac Drugs I:  -Group instruction provided by verbal instruction and  written materials to support subject.  Instructor reviews cardiac drug classes: antiplatelets, anticoagulants, beta blockers, and statins.  Instructor discusses reasons, side effects, and lifestyle considerations for each drug class.   Cardiac Drugs II:  -Group instruction provided by verbal instruction and written materials to support subject.  Instructor reviews cardiac drug classes: angiotensin converting enzyme inhibitors (ACE-I), angiotensin II receptor blockers (ARBs), nitrates, and calcium channel blockers.  Instructor discusses reasons, side effects, and lifestyle considerations for each drug class.   Anatomy and Physiology of the Circulatory System:  Group verbal and written instruction and models provide basic cardiac anatomy and physiology, with the coronary electrical and arterial systems. Review of: AMI, Angina, Valve disease, Heart Failure, Peripheral Artery Disease, Cardiac Arrhythmia, Pacemakers, and the ICD.   Other Education:  -Group or individual verbal, written, or video instructions that support the educational goals of the cardiac rehab program.   Holiday Eating Survival Tips:  -Group instruction provided by PowerPoint slides, verbal discussion, and written materials to support subject matter. The instructor gives patients tips, tricks, and techniques to help them not only survive but enjoy the holidays despite the onslaught of food that accompanies the holidays.   Knowledge Questionnaire Score: Knowledge Questionnaire Score - 04/25/17 1151      Knowledge Questionnaire Score   Pre Score  21/24       Core Components/Risk Factors/Patient Goals at Admission: Personal Goals and Risk Factors at Admission - 04/25/17 1139      Core Components/Risk Factors/Patient Goals on Admission    Weight Management  Yes    Intervention  Weight Management: Develop a combined nutrition and exercise program designed to reach desired caloric intake, while maintaining appropriate intake  of nutrient and fiber, sodium and fats, and appropriate energy expenditure required for the weight goal.;Weight Management: Provide education and appropriate resources to help participant work on and attain dietary goals.;Weight Management/Obesity: Establish reasonable short term and long term weight goals.    Admit Weight  199 lb 4.7 oz (90.4 kg)    Goal Weight: Short Term  195 lb (88.5 kg)    Goal Weight: Long Term  190 lb (86.2 kg)    Expected Outcomes  Short Term: Continue to assess and modify interventions until short term weight is achieved;Long Term: Adherence to nutrition and physical activity/exercise program aimed toward attainment of established weight goal;Weight Maintenance: Understanding of the daily nutrition guidelines, which includes 25-35% calories from fat, 7% or less cal from saturated fats, less than 200mg  cholesterol, less than 1.5gm of sodium, & 5 or more servings of fruits and vegetables daily;Understanding recommendations for meals to include 15-35% energy as protein, 25-35% energy from fat, 35-60% energy from carbohydrates, less than 200mg  of  dietary cholesterol, 20-35 gm of total fiber daily;Understanding of distribution of calorie intake throughout the day with the consumption of 4-5 meals/snacks    Hypertension  Yes    Intervention  Provide education on lifestyle modifcations including regular physical activity/exercise, weight management, moderate sodium restriction and increased consumption of fresh fruit, vegetables, and low fat dairy, alcohol moderation, and smoking cessation.;Monitor prescription use compliance.    Expected Outcomes  Short Term: Continued assessment and intervention until BP is < 140/4mm HG in hypertensive participants. < 130/2mm HG in hypertensive participants with diabetes, heart failure or chronic kidney disease.;Long Term: Maintenance of blood pressure at goal levels.    Lipids  Yes    Intervention  Provide education and support for participant on  nutrition & aerobic/resistive exercise along with prescribed medications to achieve LDL 70mg , HDL >40mg .    Expected Outcomes  Short Term: Participant states understanding of desired cholesterol values and is compliant with medications prescribed. Participant is following exercise prescription and nutrition guidelines.;Long Term: Cholesterol controlled with medications as prescribed, with individualized exercise RX and with personalized nutrition plan. Value goals: LDL < 70mg , HDL > 40 mg.    Stress  Yes    Intervention  Offer individual and/or small group education and counseling on adjustment to heart disease, stress management and health-related lifestyle change. Teach and support self-help strategies.;Refer participants experiencing significant psychosocial distress to appropriate mental health specialists for further evaluation and treatment. When possible, include family members and significant others in education/counseling sessions.    Expected Outcomes  Short Term: Participant demonstrates changes in health-related behavior, relaxation and other stress management skills, ability to obtain effective social support, and compliance with psychotropic medications if prescribed.;Long Term: Emotional wellbeing is indicated by absence of clinically significant psychosocial distress or social isolation.       Core Components/Risk Factors/Patient Goals Review:    Core Components/Risk Factors/Patient Goals at Discharge (Final Review):    ITP Comments: ITP Comments    Row Name 04/25/17 0924           ITP Comments  Dr. Fransico Him, [facility]          Comments:  Patient attended orientation from [phone] to 1120to review rules and guidelines for program. Completed 6 minute walk test, Intitial ITP, and exercise prescription.  VSS. Telemetry-SR wtith T wave inversion and frequent PAC  Pt will see [facility] with [name] on Monday 2/18. Pt. Tolerated well and asymptomatic.  Pt reports  ongoing shortness of breath that he attributes to Brilinta.  Pt avoids caffeine due to PAC"S.  Brief Psychosocial Assessment reveal anxiety toward what the future may hold.  Pt worries about having another cardiac event. Pt has had two visits to the hospital since his initial hospitalization.  Continue to reassure and support patient. Pt is eager to return to lifting weights and plans to do the CR program for two months. Will send clearance to proceed with weight lifting to [name].  Pt is eager to learn his boundaries for exercise as he would like to get back to the gym as quick as possible. Cherre Huger, BSN Cardiac and Training and development officer

## 2017-04-26 NOTE — Telephone Encounter (Signed)
Returned a call to patient informing him that when  spoke with [employer] on Wednesday they would not let Dr Claiborne Billings  do the peer to peer saying the case was closed on 04/22/17 and we would have to request for the case to reopened. They informed Hildred Alamin another message would have to be left and they would return a call to Dr Claiborne Billings so he can do a peer to peer. Message was left by [facility] to have them to call. As of approx 4:00 today they still have not called. [name] has gone for the day and is not working in the office next week.  Patient states that he will come by the office on Monday 04/29/17 so that we can call [employer] together and  Get a time pin pointed down where Dr Claiborne Billings will be able to speak with a MD to do the peer to peer. Patient informed by me that Dr Claiborne Billings is not in the office on Monday and he states that's fine he just wants for [place] to get a promised time from [employer].

## 2017-04-26 NOTE — Telephone Encounter (Signed)
Pt said he talked to [employer]  this morning,they said they had not received a peer to peer review from Dr Claiborne Billings.is

## 2017-04-29 ENCOUNTER — Telehealth: Payer: Self-pay | Admitting: *Deleted

## 2017-04-29 ENCOUNTER — Encounter (HOSPITAL_COMMUNITY): Payer: BLUE CROSS/BLUE SHIELD

## 2017-04-29 ENCOUNTER — Telehealth: Payer: Self-pay | Admitting: Physician Assistant

## 2017-04-29 NOTE — Telephone Encounter (Signed)
Called and informed patient per conversation with Maudie Mercury and Amy patient needs to wait until Angie Fava is here tomorrow before [employer] since she and Dr Claiborne Billings are the ones that have been dealing with this. I should not be a third party. Although I am the sleep coordinator this occurred prior to me coming and there has been a lot of conversations that I was not a part of. Patient okay with this since he has appointment tomorrow with Almyra Deforest. He will discuss further options to get this resolved with [facility] tomorrow.

## 2017-04-29 NOTE — Telephone Encounter (Signed)
New message    Pt c/o of Chest Pain: STAT if CP now or developed within 24 hours  1. Are you having CP right now? YES- left side chest pain  2. Are you experiencing any other symptoms (ex. SOB, nausea, vomiting, sweating)? Neck and shoulder tightness  3. How long have you been experiencing CP? 1 month  4. Is your CP continuous or coming and going? Coming and going  5. Have you taken Nitroglycerin? No ?

## 2017-04-29 NOTE — Telephone Encounter (Signed)
Spoke with pt, he is very anxious about his continued symptoms. He has a tightness in his chest that comes and goes and he will have numbness in his left arm as well and a tight feeling in his neck. The tightness in the neck is the same type thing he had prior to his recent stent. He exercises without problems. This tightness usually happens with stress. 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 Reassurance given to the patient.

## 2017-04-30 ENCOUNTER — Other Ambulatory Visit: Payer: Self-pay | Admitting: *Deleted

## 2017-04-30 ENCOUNTER — Ambulatory Visit: Payer: BLUE CROSS/BLUE SHIELD | Admitting: Oncology

## 2017-04-30 ENCOUNTER — Other Ambulatory Visit: Payer: BLUE CROSS/BLUE SHIELD

## 2017-04-30 ENCOUNTER — Telehealth: Payer: Self-pay | Admitting: *Deleted

## 2017-04-30 ENCOUNTER — Encounter: Payer: Self-pay | Admitting: Physician Assistant

## 2017-04-30 ENCOUNTER — Ambulatory Visit (INDEPENDENT_AMBULATORY_CARE_PROVIDER_SITE_OTHER): Payer: BLUE CROSS/BLUE SHIELD | Admitting: Physician Assistant

## 2017-04-30 VITALS — BP 116/72 | HR 69 | Ht 68.0 in | Wt 202.8 lb

## 2017-04-30 DIAGNOSIS — I214 Non-ST elevation (NSTEMI) myocardial infarction: Secondary | ICD-10-CM

## 2017-04-30 DIAGNOSIS — I25119 Atherosclerotic heart disease of native coronary artery with unspecified angina pectoris: Secondary | ICD-10-CM | POA: Diagnosis not present

## 2017-04-30 DIAGNOSIS — G4733 Obstructive sleep apnea (adult) (pediatric): Secondary | ICD-10-CM

## 2017-04-30 DIAGNOSIS — D751 Secondary polycythemia: Secondary | ICD-10-CM

## 2017-04-30 DIAGNOSIS — E663 Overweight: Secondary | ICD-10-CM

## 2017-04-30 DIAGNOSIS — E785 Hyperlipidemia, unspecified: Secondary | ICD-10-CM | POA: Diagnosis not present

## 2017-04-30 DIAGNOSIS — G4700 Insomnia, unspecified: Secondary | ICD-10-CM

## 2017-04-30 DIAGNOSIS — R079 Chest pain, unspecified: Secondary | ICD-10-CM | POA: Diagnosis not present

## 2017-04-30 DIAGNOSIS — R0683 Snoring: Secondary | ICD-10-CM

## 2017-04-30 MED ORDER — ISOSORBIDE MONONITRATE ER 30 MG PO TB24: 30.0000 mg | ORAL_TABLET | Freq: Every day | ORAL | 6 refills | Status: DC

## 2017-04-30 NOTE — Patient Instructions (Addendum)
Your physician has recommended you make the following change in your medication:  [place]   Your physician recommends that you schedule a follow-up appointment in: AS PLANNED

## 2017-04-30 NOTE — Telephone Encounter (Signed)
Patient in office for appointment, request to speak to nurse.   Spoke to patient and fiance in regards to sleep study process and appeals process.   Aware that two messages have been left and no phone call received back.   Aware I was OOO Friday and Monday.    Patient states he called [employer] and they informed him that we have not reached out to do appeal.  Advised patient message was left 2/12, 2/13 and [name] called 2/13 as well (see telephone notes).     Patient request to call [employer] while in office with me to speak with them.    Fiance and patient taken into office and called insurance company.   We were on the phone for approx. 1 hour with [employer] and were informed that there is nothing they can do as they are not the "health plan" and that we would have to await a call back from them.   .     Scheduled for first avaliable HST, Friday 2/22.  Patient and fiance are aware of location and time, will go by the sleep center to pick up paperwork today (sleep center aware)

## 2017-04-30 NOTE — Progress Notes (Addendum)
Cardiology Office Note    Date:  05/01/2017   ID:  MEDFORD STAHELI, DOB 02/20/1961, MRN [id number]  PCP:  [name], MD  Cardiologist:  [name]   Chief Complaint  Patient presents with  . Hospitalization Follow-up    seen for [name]  . Chest Pain    with chest tightness  . Shortness of Breath    History of Present Illness:  James [name] is a 58 y.o. male with PMH of basal cell skin CA, polycythemia and hepatitis C recently presented with NSTEMI on 03/29/2017.  Troponin on a peak at 45.54.  Hemoglobin A1c 5.5. Total cholesterol 155, HDL 26, LDL 91, triglyceride 191.  Urgent cardiac catheterization on the same day showed 100% proximal LAD lesion treated with thrombectomy and 3.5 x 16 mm Synergy DES, EF 45%.  Postprocedure, he was on IV Aggrastat for 18 hours.  He was also placed on aspirin, Brilinta and high-dose statin.  Imdur was discontinued due to headache.  Given his significant problem with anxiety, 1 month supply of as needed Xanax was given.  I last saw the patient on 04/09/2017 for post-cath follow-up.  He denied any chest pain at the time.  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  He was having a lot of anxiety issues.  I also referred the patient to [name] for management of obstructive sleep apnea.  He could not tolerate full facial mask in the past, however I think he can tolerate nasal device instead.  Unfortunately, patient presented to the hospital with chest pain on the following morning after the office visit.  Troponin was initially elevated and peaked at 2.7.  He underwent cardiac catheterization, however this showed his LAD stent was widely patent, there is mild jailing of the diagonal artery.  Retrospective, it was unclear if patient had transient partial stent thrombosis or coronary spasm at the end of the stent.  Echocardiogram was  repeated which showed EF 55-60%, severe hypokinesis of the akinesis of mid anteroseptal and apical anterior wall, moderate LVH.  Ejection fraction has improved from the previous 40-45% on echo.  Low-dose nitrates were added, patient was eventually discharged on 04/11/2017.  He presents today for cardiology office visit.  Since discharge, he has a very focal dime sized left-sided chest pain it only occurs a second at each time, I reassured him that this is unlikely to be cardiac in nature.  He also complaining of occasional throat tightness which was his original anginal symptom.  The same symptom occurs every time he became very anxious as well.  I do not have a very good explanation why he had elevated troponin despite negative.  It is possible he is having recurrent coronary spasm.  I will increase his Imdur to 30 mg daily.  He has upcoming visit with [name] for management of obstructive sleep apnea.  He also has upcoming visit with [name] as well.  He is somewhat upset that [name] will not allow him to have a sleep study at home, his insurance told him it less expensive that way. Otherwise he will continue on cardiac rehab.    Past Medical History:  Diagnosis Date  . Acute systolic congestive heart failure ([place])   . Anemia    slight anemia with hepatitis tx  . Arthritis    oa  .  Cancer ([place]) 4 months ago   basal cell skin cancer removed from chest area  . GERD (gastroesophageal reflux disease)   . Hepatitis C 04/29/2012   had savalta treatment 2 years ago, now cured  . Hyperlipidemia   . NSTEMI (non-ST elevated myocardial infarction) ([place]) 03/29/2017   1/19 PCI/DESx1 to mLAD, EF 45%  . Palpitations    at times     Past Surgical History:  Procedure Laterality Date  . ARTHROSCOPIC REPAIR ACL Right 15 years ago  . CORONARY STENT INTERVENTION N/A 03/29/2017   Procedure: CORONARY STENT INTERVENTION;  Surgeon: Martinique, Peter M, MD;  Location: [facility];  Service:  Cardiovascular;  Laterality: N/A;  . CORONARY THROMBECTOMY N/A 03/29/2017   Procedure: Coronary Thrombectomy;  Surgeon: Martinique, Peter M, MD;  Location: [facility];  Service: Cardiovascular;  Laterality: N/A;  . LEFT HEART CATH AND CORONARY ANGIOGRAPHY N/A 03/29/2017   Procedure: LEFT HEART CATH AND CORONARY ANGIOGRAPHY;  Surgeon: Martinique, Peter M, MD;  Location: [facility];  Service: Cardiovascular;  Laterality: N/A;  . LEFT HEART CATH AND CORONARY ANGIOGRAPHY N/A 04/10/2017   Procedure: LEFT HEART CATH AND CORONARY ANGIOGRAPHY;  Surgeon: Martinique, Peter M, MD;  Location: [facility];  Service: Cardiovascular;  Laterality: N/A;  . TOTAL HIP ARTHROPLASTY Right 01/11/2015   Procedure: RIGHT TOTAL HIP ARTHROPLASTY ANTERIOR APPROACH;  Surgeon: Paralee Cancel, MD;  Location: [facility];  Service: Orthopedics;  Laterality: Right;    Current Medications: Outpatient Medications Prior to Visit  Medication Sig Dispense Refill  . ALPRAZolam (XANAX) 0.25 MG tablet Take 1 tablet (0.25 mg total) by mouth 2 (two) times daily as needed for anxiety. 20 tablet 0  . aspirin 81 MG tablet Take 1 tablet (81 mg total) by mouth daily. 90 tablet 3  . atorvastatin (LIPITOR) 80 MG tablet Take 1 tablet (80 mg total) by mouth daily at 6 PM. 30 tablet 6  . colchicine 0.6 MG tablet Take 1 tablet (0.6 mg total) by mouth 2 (two) times daily as needed (gout flare up). 60 tablet 6  . lisinopril (PRINIVIL,ZESTRIL) 2.5 MG tablet Take 1 tablet (2.5 mg total) by mouth daily. 30 tablet 6  . metoprolol succinate (TOPROL-XL) 25 MG 24 hr tablet Take 1 tablet (25 mg total) by mouth daily. 30 tablet 6  . nitroGLYCERIN (NITROSTAT) 0.4 MG SL tablet Place 1 tablet (0.4 mg total) under the tongue every 5 (five) minutes as needed. 25 tablet 12  . ticagrelor (BRILINTA) 90 MG TABS tablet Take 1 tablet (90 mg total) by mouth 2 (two) times daily. 60 tablet 6  . valACYclovir (VALTREX) 1000 MG tablet Take 1,000 mg by mouth daily as needed  (for fever blisters). Reported on 09/22/2015  2  . isosorbide mononitrate (IMDUR) 30 MG 24 hr tablet Take 0.5 tablets (15 mg total) by mouth daily. 30 tablet 0  . famotidine (PEPCID) 20 MG tablet Take 1 tablet (20 mg total) by mouth 2 (two) times daily. (Patient not taking: Reported on 04/19/2017) 10 tablet 0   No facility-administered medications prior to visit.      Allergies:   Patient has no known allergies.   Social History   Socioeconomic History  . Marital status: Single    Spouse name: None  . Number of children: None  . Years of education: None  . Highest education level: None  Social Needs  . Financial resource strain: None  . Food insecurity - worry: None  . Food  insecurity - inability: None  . Transportation needs - medical: None  . Transportation needs - non-medical: None  Occupational History  . Occupation:  Financial controller: [employer]  Tobacco Use  . Smoking status: Former Smoker    Types: Cigars    Last attempt to quit: 03/29/2017    Years since quitting: 0.0  . Smokeless tobacco: Never Used  . Tobacco comment: cigar  Substance and Sexual Activity  . Alcohol use: No  . Drug use: No  . Sexual activity: None  Other Topics Concern  . None  Social History Narrative  . None     Family History:  The patient's family history includes Alzheimer's disease in his mother; CAD in his father; Heart failure in his father.   ROS:   Please see the history of present illness.    ROS All other systems reviewed and are negative.   PHYSICAL EXAM:   VS:  BP 116/72 (BP Location: Right Arm, Patient Position: Sitting, Cuff Size: Normal)   Pulse 69   Ht 5\' 8"  (1.727 m)   Wt 202 lb 12.8 oz (92 kg)   SpO2 96%   BMI 30.84 kg/m    GEN: Well nourished, well developed, in no acute distress  HEENT: normal  Neck: no JVD, carotid bruits, or masses Cardiac: RRR; no murmurs, rubs, or gallops,no edema  Respiratory:  clear to auscultation bilaterally, normal  work of breathing GI: soft, nontender, nondistended, + BS MS: no deformity or atrophy  Skin: warm and dry, no rash Neuro:  Alert and Oriented x 3, Strength and sensation are intact Psych: euthymic mood, full affect  Wt Readings from Last 3 Encounters:  04/30/17 202 lb 12.8 oz (92 kg)  04/25/17 199 lb 4.7 oz (90.4 kg)  04/11/17 201 lb (91.2 kg)      Studies/Labs Reviewed:   EKG:  EKG is ordered today.  The ekg ordered today demonstrates normal sinus rhythm with T wave inversion in inferior and anterior leads.  Unchanged when compared to the previous EKG.  A copy of his today's EKG will be given to the patient since he is traveling between [place] and the [place] area.  Recent Labs: 03/31/2017: Magnesium 2.0 04/01/2017: ALT 27 04/10/2017: Hemoglobin 16.7; Platelets 235 04/11/2017: BUN 21; Creatinine, Ser 1.19; Potassium 4.7; Sodium 140   Lipid Panel    Component Value Date/Time   CHOL 155 03/30/2017 0251   TRIG 191 (H) 03/30/2017 0251   HDL 26 (L) 03/30/2017 0251   CHOLHDL 6.0 03/30/2017 0251   VLDL 38 03/30/2017 0251   LDLCALC 91 03/30/2017 0251    Additional studies/ records that were reviewed today include:   Cath 03/29/2017 Conclusion     Prox LAD lesion is 100% stenosed.  Post intervention, there is a 0% residual stenosis.  A drug-eluting stent was successfully placed using a STENT SYNERGY DES 3.5X16.  The left ventricular systolic function is normal.  LV end diastolic pressure is normal.  The left ventricular ejection fraction is 45-50% by visual estimate.  1. Single vessel occlusive CAD- 100% mid LAD with collaterals 2. Mild LV dysfunction. EF 45% 3. Normal LVEDP 4. Successful aspiration thrombectomy and stenting of the mid LAD with DES   Plan: DAPT with ASA and Brilinta. IV Aggrastat for 18 hours. May be a candidate for DC Sunday if no complications. High dose statin.    Echo 03/30/2017 LV EF: 40% - 45%  Study Conclusions  - Left  ventricle: Septal  and apical akinesis inferior wall hypokinesis The cavity size was mildly dilated. Wall thickness was increased in a pattern of mild LVH. Systolic function was mildly to moderately reduced. The estimated ejection fraction was in the range of 40% to 45%. Left ventricular diastolic function parameters were normal. - Atrial septum: No defect or patent foramen ovale was identified.   Cath 04/10/2017 Conclusion     Previously placed Prox LAD stent (unknown type) is widely patent.  LV end diastolic pressure is normal.   1. No significant obstructive CAD. The stent in the LAD is widely patent. 2. Normal LVEDP  Plan: continue medical therapy.      ASSESSMENT:    1. Chest pain, unspecified type   2. Coronary artery disease involving native coronary artery of native heart with angina pectoris ([place])   3. Hyperlipidemia, unspecified hyperlipidemia type   4. Polycythemia   5. OSA (obstructive sleep apnea)      PLAN:  In order of problems listed above:  1. Chest pain: He has a very focal dime sized chest pain on the left side, it is worse with palpation.  No further workup is needed.  His anginal symptom was more throat tightness, unfortunately this also occurred when he becomes anxious.  As was the last visit, I spent close to a hour during today's visit largely to answer his questions.  He wanted definitive answer regarding how long it would take for him to completely recover, I do not think anybody can answer that question especially with his anxiety issues.  He will need to start on cardiac rehab.  At this point, he is very much compliant with his medication especially DAPT.  I did increase his Imdur given the possibility of coronary spasm recently.  2. CAD: Underwent PCI of proximal LAD in January.  Repeat cardiac catheterization near the end of January showed patent stent  3. Hyperlipidemia: Pending fasting lipid panel  4. Polycythemia: Managed by Dr.  Earlie Server  5. Obstructive sleep apnea: Managed by [name].  He could not tolerate full facial mask before.    Medication Adjustments/Labs and Tests Ordered: Current medicines are reviewed at length with the patient today.  Concerns regarding medicines are outlined above.  Medication changes, Labs and Tests ordered today are listed in the Patient Instructions below. Patient Instructions  Your physician has recommended you make the following change in your medication:  INCREASE ISOSORBIDE 30 MG EVERY DAY   Your physician recommends that you schedule a follow-up appointment in: [place], Almyra Deforest, [place]  05/01/2017 8:51 AM    [address], [place],   [address] Phone: [phone]; Fax: [phone]

## 2017-05-01 ENCOUNTER — Encounter: Payer: Self-pay | Admitting: Physician Assistant

## 2017-05-01 ENCOUNTER — Encounter (HOSPITAL_COMMUNITY): Payer: BLUE CROSS/BLUE SHIELD

## 2017-05-01 ENCOUNTER — Encounter (HOSPITAL_COMMUNITY)
Admission: RE | Admit: 2017-05-01 | Discharge: 2017-05-01 | Disposition: A | Discharge status: Home / Self Care | Payer: BLUE CROSS/BLUE SHIELD | Source: Ambulatory Visit | Attending: Cardiology | Admitting: Cardiology

## 2017-05-01 DIAGNOSIS — Z7902 Long term (current) use of antithrombotics/antiplatelets: Secondary | ICD-10-CM | POA: Diagnosis not present

## 2017-05-01 DIAGNOSIS — F17290 Nicotine dependence, other tobacco product, uncomplicated: Secondary | ICD-10-CM | POA: Diagnosis not present

## 2017-05-01 DIAGNOSIS — I214 Non-ST elevation (NSTEMI) myocardial infarction: Secondary | ICD-10-CM | POA: Diagnosis not present

## 2017-05-01 DIAGNOSIS — Z79899 Other long term (current) drug therapy: Secondary | ICD-10-CM | POA: Diagnosis not present

## 2017-05-01 DIAGNOSIS — Z85828 Personal history of other malignant neoplasm of skin: Secondary | ICD-10-CM | POA: Diagnosis not present

## 2017-05-01 DIAGNOSIS — Z8619 Personal history of other infectious and parasitic diseases: Secondary | ICD-10-CM | POA: Diagnosis not present

## 2017-05-01 DIAGNOSIS — Z955 Presence of coronary angioplasty implant and graft: Secondary | ICD-10-CM | POA: Diagnosis not present

## 2017-05-01 DIAGNOSIS — E785 Hyperlipidemia, unspecified: Secondary | ICD-10-CM | POA: Diagnosis not present

## 2017-05-01 DIAGNOSIS — Z7982 Long term (current) use of aspirin: Secondary | ICD-10-CM | POA: Diagnosis not present

## 2017-05-01 DIAGNOSIS — K219 Gastro-esophageal reflux disease without esophagitis: Secondary | ICD-10-CM | POA: Diagnosis not present

## 2017-05-01 DIAGNOSIS — M1990 Unspecified osteoarthritis, unspecified site: Secondary | ICD-10-CM | POA: Diagnosis not present

## 2017-05-01 NOTE — Progress Notes (Signed)
Cardiac Individual Treatment Plan  Patient Details  Name: James [name] MRN: [id number] Date of Birth: May 02, 1960 Referring Provider:     CARDIAC REHAB PHASE II ORIENTATION from 04/25/2017 in [place]  Referring Provider  Martinique, Peter MD       Initial Encounter Date:    CARDIAC REHAB PHASE II ORIENTATION from 04/25/2017 in [place]  Date  04/25/17  Referring Provider  Martinique, Peter MD       Visit Diagnosis: NSTEMI (non-ST elevated myocardial infarction) [facility])  Status post coronary artery stent placement  Patient's Home Medications on Admission:  Current Outpatient Medications:  .  ALPRAZolam (XANAX) 0.25 MG tablet, Take 1 tablet (0.25 mg total) by mouth 2 (two) times daily as needed for anxiety., Disp: 20 tablet, Rfl: 0 .  aspirin 81 MG tablet, Take 1 tablet (81 mg total) by mouth daily., Disp: 90 tablet, Rfl: 3 .  atorvastatin (LIPITOR) 80 MG tablet, Take 1 tablet (80 mg total) by mouth daily at 6 PM., Disp: 30 tablet, Rfl: 6 .  colchicine 0.6 MG tablet, Take 1 tablet (0.6 mg total) by mouth 2 (two) times daily as needed (gout flare up)., Disp: 60 tablet, Rfl: 6 .  isosorbide mononitrate (IMDUR) 30 MG 24 hr tablet, Take 1 tablet (30 mg total) by mouth daily., Disp: 30 tablet, Rfl: 6 .  lisinopril (PRINIVIL,ZESTRIL) 2.5 MG tablet, Take 1 tablet (2.5 mg total) by mouth daily., Disp: 30 tablet, Rfl: 6 .  metoprolol succinate (TOPROL-XL) 25 MG 24 hr tablet, Take 1 tablet (25 mg total) by mouth daily., Disp: 30 tablet, Rfl: 6 .  nitroGLYCERIN (NITROSTAT) 0.4 MG SL tablet, Place 1 tablet (0.4 mg total) under the tongue every 5 (five) minutes as needed., Disp: 25 tablet, Rfl: 12 .  ticagrelor (BRILINTA) 90 MG TABS tablet, Take 1 tablet (90 mg total) by mouth 2 (two) times daily., Disp: 60 tablet, Rfl: 6 .  valACYclovir (VALTREX) 1000 MG tablet, Take 1,000 mg by mouth daily as needed (for fever blisters). Reported on  09/22/2015, Disp: , Rfl: 2 .  famotidine (PEPCID) 20 MG tablet, Take 1 tablet (20 mg total) by mouth 2 (two) times daily. (Patient not taking: Reported on 04/19/2017), Disp: 10 tablet, Rfl: 0  Past Medical History: Past Medical History:  Diagnosis Date  . Acute systolic congestive heart failure ([place])   . Anemia    slight anemia with hepatitis tx  . Arthritis    oa  . 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 GERD (gastroesophageal reflux disease)   . Hepatitis C 04/29/2012   had savalta treatment 2 years ago, now cured  . Hyperlipidemia   . NSTEMI (non-ST elevated myocardial infarction) ([place]) 03/29/2017   1/19 PCI/DESx1 to mLAD, EF 45%  . Palpitations    at times     Tobacco Use: Social History   Tobacco Use  Smoking Status Former Smoker  . Types: Cigars  . Last attempt to quit: 03/29/2017  . Years since quitting: 0.0  Smokeless Tobacco Never Used  Tobacco Comment   cigar    Labs: Recent Review Flowsheet Data    Labs for ITP Cardiac and Pulmonary Rehab Latest Ref Rng & Units 03/29/2017 03/30/2017   Cholestrol 0 - 200 mg/dL - 155   LDLCALC 0 - 99 mg/dL - 91   HDL >40 mg/dL - 26(L)   Trlycerides <150 mg/dL - 191(H)  Hemoglobin A1c 4.8 - 5.6 % 5.5 -      Capillary Blood Glucose: No results found for: GLUCAP   Exercise Target Goals:    Exercise Program Goal: Individual exercise prescription set using results from initial 6 min walk test and THRR while considering  patient's activity barriers and safety.   Exercise Prescription Goal: Initial exercise prescription builds to 30-45 minutes a day of aerobic activity, 2-3 days per week.  Home exercise guidelines will be given to patient during program as part of exercise prescription that the participant will acknowledge.  Activity Barriers & Risk Stratification: Activity Barriers & Cardiac Risk Stratification - 04/25/17 0925      Activity Barriers & Cardiac Risk Stratification    Activity Barriers  Right Hip Replacement Right Tricep Severed       6 Minute Walk: 6 Minute Walk    Row Name 04/25/17 1149         6 Minute Walk   Phase  Initial     Distance  1670 feet     Walk Time  6 minutes     # of Rest Breaks  0     MPH  3.16     METS  3.96     RPE  8     Perceived Dyspnea   0     VO2 Peak  13.89     Symptoms  No     Resting HR  87 bpm     Resting BP  104/68     Resting Oxygen Saturation   96 %     Exercise Oxygen Saturation  during 6 min walk  100 %     Max Ex. HR  100 bpm     Max Ex. BP  120/72     2 Minute Post BP  98/62        Oxygen Initial Assessment:   Oxygen Re-Evaluation:   Oxygen Discharge (Final Oxygen Re-Evaluation):   Initial Exercise Prescription: Initial Exercise Prescription - 04/25/17 1100      Date of Initial Exercise RX and Referring Provider   Date  04/25/17    Referring Provider  Martinique, Peter MD       Treadmill   MPH  3    Grade  1    Minutes  10    METs  3.71      Bike   Level  1.3    Minutes  10    METs  3.73      Elliptical   Level  1    Speed  1    Minutes  10    METs  3.7      Prescription Details   Frequency (times per week)  3x    Duration  Progress to 45 minutes of aerobic exercise without signs/symptoms of physical distress      Intensity   THRR 40-80% of Max Heartrate  65-130    Ratings of Perceived Exertion  11-15    Perceived Dyspnea  0-4      Progression   Progression  Continue progressive overload as per policy without signs/symptoms or physical distress.      Resistance Training   Training Prescription  Yes    Weight  6lbs    Reps  10-15       Perform Capillary Blood Glucose checks as needed.  Exercise Prescription Changes:  Exercise Prescription Changes    Row Name 05/01/17 [phone]  Response to Exercise   Blood Pressure (Admit)  102/60       Blood Pressure (Exercise)  138/70       Blood Pressure (Exit)  102/60       Heart Rate (Admit)  98 bpm        Heart Rate (Exercise)  126 bpm       Heart Rate (Exit)  104 bpm       Oxygen Saturation (Exercise)  95 %       Rating of Perceived Exertion (Exercise)  12       Perceived Dyspnea (Exercise)  1       Symptoms  mild DOE       Comments  Decreased TM workload today due to SOB .       Duration  Continue with 30 min of aerobic exercise without signs/symptoms of physical distress.       Intensity  THRR unchanged         Progression   Progression  Continue to progress workloads to maintain intensity without signs/symptoms of physical distress.       Average METs  4         Resistance Training   Training Prescription  No Relaxation day, no weights.         Interval Training   Interval Training  No         Treadmill   MPH  2.4       Grade  0       Minutes  10       METs  2.84         Bike   Level  1.3       Minutes  10       METs  3.74         Adaptive Motion Trainer   Resistance  1       Grade  2       Minutes  10       METs  5.5          Exercise Comments:  Exercise Comments    Row Name 05/01/17 1201           Exercise Comments  Off to a good start with exercise. Pt tolerated moderate intensity fair with some DOE.          Exercise Goals and Review:  Exercise Goals    Row Name 04/25/17 1150             Exercise Goals   Increase Physical Activity  Yes       Intervention  Provide advice, education, support and counseling about physical activity/exercise needs.;Develop an individualized exercise prescription for aerobic and resistive training based on initial evaluation findings, risk stratification, comorbidities and participant's personal goals.       Expected Outcomes  Short Term: Attend rehab on a regular basis to increase amount of physical activity.;Long Term: Add in home exercise to make exercise part of routine and to increase amount of physical activity.;Long Term: Exercising regularly at least 3-5 days a week.       Increase Strength and Stamina  Yes        Intervention  Provide advice, education, support and counseling about physical activity/exercise needs.;Develop an individualized exercise prescription for aerobic and resistive training based on initial evaluation findings, risk stratification, comorbidities and participant's personal goals.       Expected Outcomes  Short Term: Increase workloads from initial exercise prescription for  resistance, speed, and METs.;Short Term: Perform resistance training exercises routinely during rehab and add in resistance training at home;Long Term: Improve cardiorespiratory fitness, muscular endurance and strength as measured by increased METs and functional capacity (6MWT)       Able to understand and use rate of perceived exertion (RPE) scale  Yes       Intervention  Provide education and explanation on how to use RPE scale       Expected Outcomes  Short Term: Able to use RPE daily in rehab to express subjective intensity level;Long Term:  Able to use RPE to guide intensity level when exercising independently       Knowledge and understanding of Target Heart Rate Range (THRR)  Yes       Intervention  Provide education and explanation of THRR including how the numbers were predicted and where they are located for reference       Expected Outcomes  Short Term: Able to state/look up THRR;Short Term: Able to use daily as guideline for intensity in rehab;Long Term: Able to use THRR to govern intensity when exercising independently       Able to check pulse independently  Yes       Intervention  Provide education and demonstration on how to check pulse in carotid and radial arteries.;Review the importance of being able to check your own pulse for safety during independent exercise       Expected Outcomes  Short Term: Able to explain why pulse checking is important during independent exercise;Long Term: Able to check pulse independently and accurately       Understanding of Exercise Prescription  Yes       Intervention   Provide education, explanation, and written materials on patient's individual exercise prescription       Expected Outcomes  Short Term: Able to explain program exercise prescription;Long Term: Able to explain home exercise prescription to exercise independently          Exercise Goals Re-Evaluation : Exercise Goals Re-Evaluation    [place] Name 05/01/17 1158             Exercise Goal Re-Evaluation   Exercise Goals Review  Increase Physical Activity;Able to understand and use Dyspnea scale;Able to understand and use rate of perceived exertion (RPE) scale       Comments  Patient tolerated moderate intensity exercise fair with mild SOB with exertion. Explained dyspnea scale and patient rated his SOB of 1/4. Patient understands and is able to use RPE scale appropriately.       Expected Outcomes  Increase workloads as tolerated to help increase strength and stamina.           Discharge Exercise Prescription (Final Exercise Prescription Changes): Exercise Prescription Changes - 05/01/17 0956      Response to Exercise   Blood Pressure (Admit)  102/60    Blood Pressure (Exercise)  138/70    Blood Pressure (Exit)  102/60    Heart Rate (Admit)  98 bpm    Heart Rate (Exercise)  126 bpm    Heart Rate (Exit)  104 bpm    Oxygen Saturation (Exercise)  95 %    Rating of Perceived Exertion (Exercise)  12    Perceived Dyspnea (Exercise)  1    Symptoms  mild DOE    Comments  Decreased TM workload today due to SOB .    Duration  Continue with 30 min of aerobic exercise without signs/symptoms of physical distress.    Intensity  THRR unchanged      Progression   Progression  Continue to progress workloads to maintain intensity without signs/symptoms of physical distress.    Average METs  4      Resistance Training   Training Prescription  No Relaxation day, no weights.      Interval Training   Interval Training  No      Treadmill   MPH  2.4    Grade  0    Minutes  10    METs  2.84       Bike   Level  1.3    Minutes  10    METs  3.74      Adaptive Motion Trainer   Resistance  1    Grade  2    Minutes  10    METs  5.5       Nutrition:  Target Goals: Understanding of nutrition guidelines, daily intake of sodium 1500mg , cholesterol 200mg , calories 30% from fat and 7% or less from saturated fats, daily to have 5 or more servings of fruits and vegetables.  Biometrics: Pre Biometrics - 04/25/17 1150      Pre Biometrics   Height  5\' 8"  (1.727 m)    Weight  199 lb 4.7 oz (90.4 kg)    Waist Circumference  39 inches    Hip Circumference  40.5 inches    Waist to Hip Ratio  0.96 %    BMI (Calculated)  30.31    Triceps Skinfold  9 mm    % Body Fat  25.6 %    Grip Strength  39 kg    Flexibility  13.5 in    Single Leg Stand  12.45 seconds        Nutrition Therapy Plan and Nutrition Goals: Nutrition Therapy & Goals - 04/26/17 1011      Nutrition Therapy   Diet  Heart Healthy      Intervention Plan   Intervention  Prescribe, educate and counsel regarding individualized specific dietary modifications aiming towards targeted core components such as weight, hypertension, lipid management, diabetes, heart failure and other comorbidities.    Expected Outcomes  Short Term Goal: Understand basic principles of dietary content, such as calories, fat, sodium, cholesterol and nutrients.;Long Term Goal: Adherence to prescribed nutrition plan.       Nutrition Assessments: Nutrition Assessments - 04/26/17 1011      MEDFICTS Scores   Pre Score  27       Nutrition Goals Re-Evaluation:   Nutrition Goals Re-Evaluation:   Nutrition Goals Discharge (Final Nutrition Goals Re-Evaluation):   Psychosocial: Target Goals: Acknowledge presence or absence of significant depression and/or stress, maximize coping skills, provide positive support system. Participant is able to verbalize types and ability to use techniques and skills needed for reducing stress and  depression.  Initial Review & Psychosocial Screening: Initial Psych Review & Screening - 04/25/17 1153      Family Dynamics   Good Support System?  -- Pt feels supported by his family and fiance.  Pt plans to get married on 3/2.      Barriers   Psychosocial barriers to participate in program  The patient should benefit from training in stress management and relaxation.      Screening Interventions   Interventions  Encouraged to exercise    Expected Outcomes  Long Term goal: The participant improves quality of Life and PHQ9 Scores as seen by post scores and/or verbalization of changes;Long Term Goal: Stressors or  current issues are controlled or eliminated.;Short Term goal: Utilizing psychosocial counselor, staff and physician to assist with identification of specific Stressors or current issues interfering with healing process. Setting desired goal for each stressor or current issue identified.       Quality of Life Scores: Quality of Life - 04/25/17 1151      Quality of Life Scores   Health/Function Pre  18.88 %    Socioeconomic Pre  29.14 %    Psych/Spiritual Pre  27.43 %    Family Pre  30 %    GLOBAL Pre  24.73 %      Scores of 19 and below usually indicate a poorer quality of life in these areas.  A difference of  2-3 points is a clinically meaningful difference.  A difference of 2-3 points in the total score of the Quality of Life Index has been associated with significant improvement in overall quality of life, self-image, physical symptoms, and general health in studies assessing change in quality of life.  PHQ-9: Recent Review Flowsheet Data    Depression screen [facility] 2/9 05/01/2017   Decreased Interest 0   Down, Depressed, Hopeless 0   PHQ - 2 Score 0     Interpretation of Total Score  Total Score Depression Severity:  1-4 = Minimal depression, 5-9 = Mild depression, 10-14 = Moderate depression, 15-19 = Moderately severe depression, 20-27 = Severe depression    Psychosocial Evaluation and Intervention:   Psychosocial Re-Evaluation:   Psychosocial Discharge (Final Psychosocial Re-Evaluation):   Vocational Rehabilitation: Provide vocational rehab assistance to qualifying candidates.   Vocational Rehab Evaluation & Intervention:   Education: Education Goals: Education classes will be provided on a weekly basis, covering required topics. Participant will state understanding/return demonstration of topics presented.  Learning Barriers/Preferences: Learning Barriers/Preferences - 04/25/17 8366      Learning Barriers/Preferences   Learning Barriers  Sight    Learning Preferences  Skilled Demonstration;Individual Instruction;Verbal Instruction       Education Topics: Count Your Pulse:  -Group instruction provided by verbal instruction, demonstration, patient participation and written materials to support subject.  Instructors address importance of being able to find your pulse and how to count your pulse when at home without a heart monitor.  Patients get hands on experience counting their pulse with staff help and individually.   Heart Attack, Angina, and Risk Factor Modification:  -Group instruction provided by verbal instruction, video, and written materials to support subject.  Instructors address signs and symptoms of angina and heart attacks.    Also discuss risk factors for heart disease and how to make changes to improve heart health risk factors.   Functional Fitness:  -Group instruction provided by verbal instruction, demonstration, patient participation, and written materials to support subject.  Instructors address safety measures for doing things around the house.  Discuss how to get up and down off the floor, how to pick things up properly, how to safely get out of a chair without assistance, and balance training.   Meditation and Mindfulness:  -Group instruction provided by verbal instruction, patient participation, and  written materials to support subject.  Instructor addresses importance of mindfulness and meditation practice to help reduce stress and improve awareness.  Instructor also leads participants through a meditation exercise.    Stretching for Flexibility and Mobility:  -Group instruction provided by verbal instruction, patient participation, and written materials to support subject.  Instructors lead participants through series of stretches that are designed to increase flexibility thus improving  mobility.  These stretches are additional exercise for major muscle groups that are typically performed during regular warm up and cool down.   Hands Only CPR:  -Group verbal, video, and participation provides a basic overview of AHA guidelines for community CPR. Role-play of emergencies allow participants the opportunity to practice calling for help and chest compression technique with discussion of AED use.   Hypertension: -Group verbal and written instruction that provides a basic overview of hypertension including the most recent diagnostic guidelines, risk factor reduction with self-care instructions and medication management.    Nutrition I class: Heart Healthy Eating:  -Group instruction provided by PowerPoint slides, verbal discussion, and written materials to support subject matter. The instructor gives an explanation and review of the Therapeutic Lifestyle Changes diet recommendations, which includes a discussion on lipid goals, dietary fat, sodium, fiber, plant stanol/sterol esters, sugar, and the components of a well-balanced, healthy diet.   CARDIAC REHAB PHASE II ORIENTATION from 04/25/2017 in [place]  Date  04/26/17  Educator  RD      Nutrition II class: Lifestyle Skills:  -Group instruction provided by PowerPoint slides, verbal discussion, and written materials to support subject matter. The instructor gives an explanation and review of label reading,  grocery shopping for heart health, heart healthy recipe modifications, and ways to make healthier choices when eating out.   CARDIAC REHAB PHASE II ORIENTATION from 04/25/2017 in [place]  Date  04/30/17  Educator  RD  Instruction Review Code  2- Demonstrated Understanding      Diabetes Question & Answer:  -Group instruction provided by PowerPoint slides, verbal discussion, and written materials to support subject matter. The instructor gives an explanation and review of diabetes co-morbidities, pre- and post-prandial blood glucose goals, pre-exercise blood glucose goals, signs, symptoms, and treatment of hypoglycemia and hyperglycemia, and foot care basics.   Diabetes Blitz:  -Group instruction provided by PowerPoint slides, verbal discussion, and written materials to support subject matter. The instructor gives an explanation and review of the physiology behind type 1 and type 2 diabetes, diabetes medications and rational behind using different medications, pre- and post-prandial blood glucose recommendations and Hemoglobin A1c goals, diabetes diet, and exercise including blood glucose guidelines for exercising safely.    Portion Distortion:  -Group instruction provided by PowerPoint slides, verbal discussion, written materials, and food models to support subject matter. The instructor gives an explanation of serving size versus portion size, changes in portions sizes over the last 20 years, and what consists of a serving from each food group.   Stress Management:  -Group instruction provided by verbal instruction, video, and written materials to support subject matter.  Instructors review role of stress in heart disease and how to cope with stress positively.     Exercising on Your Own:  -Group instruction provided by verbal instruction, power point, and written materials to support subject.  Instructors discuss benefits of exercise, components of  exercise, frequency and intensity of exercise, and end points for exercise.  Also discuss use of nitroglycerin and activating EMS.  Review options of places to exercise outside of rehab.  Review guidelines for sex with heart disease.   Cardiac Drugs I:  -Group instruction provided by verbal instruction and written materials to support subject.  Instructor reviews cardiac drug classes: antiplatelets, anticoagulants, beta blockers, and statins.  Instructor discusses reasons, side effects, and lifestyle considerations for each drug class.   Cardiac Drugs II:  -Group instruction provided  by verbal instruction and written materials to support subject.  Instructor reviews cardiac drug classes: angiotensin converting enzyme inhibitors (ACE-I), angiotensin II receptor blockers (ARBs), nitrates, and calcium channel blockers.  Instructor discusses reasons, side effects, and lifestyle considerations for each drug class.   Anatomy and Physiology of the Circulatory System:  Group verbal and written instruction and models provide basic cardiac anatomy and physiology, with the coronary electrical and arterial systems. Review of: AMI, Angina, Valve disease, Heart Failure, Peripheral Artery Disease, Cardiac Arrhythmia, Pacemakers, and the ICD.   Other Education:  -Group or individual verbal, written, or video instructions that support the educational goals of the cardiac rehab program.   Holiday Eating Survival Tips:  -Group instruction provided by PowerPoint slides, verbal discussion, and written materials to support subject matter. The instructor gives patients tips, tricks, and techniques to help them not only survive but enjoy the holidays despite the onslaught of food that accompanies the holidays.   Knowledge Questionnaire Score: Knowledge Questionnaire Score - 04/25/17 1151      Knowledge Questionnaire Score   Pre Score  21/24       Core Components/Risk Factors/Patient Goals at  Admission: Personal Goals and Risk Factors at Admission - 04/25/17 1139      Core Components/Risk Factors/Patient Goals on Admission    Weight Management  Yes    Intervention  Weight Management: Develop a combined nutrition and exercise program designed to reach desired caloric intake, while maintaining appropriate intake of nutrient and fiber, sodium and fats, and appropriate energy expenditure required for the weight goal.;Weight Management: Provide education and appropriate resources to help participant work on and attain dietary goals.;Weight Management/Obesity: Establish reasonable short term and long term weight goals.    Admit Weight  199 lb 4.7 oz (90.4 kg)    Goal Weight: Short Term  195 lb (88.5 kg)    Goal Weight: Long Term  190 lb (86.2 kg)    Expected Outcomes  Short Term: Continue to assess and modify interventions until short term weight is achieved;Long Term: Adherence to nutrition and physical activity/exercise program aimed toward attainment of established weight goal;Weight Maintenance: Understanding of the daily nutrition guidelines, which includes 25-35% calories from fat, 7% or less cal from saturated fats, less than 200mg  cholesterol, less than 1.5gm of sodium, & 5 or more servings of fruits and vegetables daily;Understanding recommendations for meals to include 15-35% energy as protein, 25-35% energy from fat, 35-60% energy from carbohydrates, less than 200mg  of dietary cholesterol, 20-35 gm of total fiber daily;Understanding of distribution of calorie intake throughout the day with the consumption of 4-5 meals/snacks    Hypertension  Yes    Intervention  Provide education on lifestyle modifcations including regular physical activity/exercise, weight management, moderate sodium restriction and increased consumption of fresh fruit, vegetables, and low fat dairy, alcohol moderation, and smoking cessation.;Monitor prescription use compliance.    Expected Outcomes  Short Term:  Continued assessment and intervention until BP is < 140/91mm HG in hypertensive participants. < 130/31mm HG in hypertensive participants with diabetes, heart failure or chronic kidney disease.;Long Term: Maintenance of blood pressure at goal levels.    Lipids  Yes    Intervention  Provide education and support for participant on nutrition & aerobic/resistive exercise along with prescribed medications to achieve LDL 70mg , HDL >40mg .    Expected Outcomes  Short Term: Participant states understanding of desired cholesterol values and is compliant with medications prescribed. Participant is following exercise prescription and nutrition guidelines.;Long Term: Cholesterol controlled with medications  as prescribed, with individualized exercise RX and with personalized nutrition plan. Value goals: LDL < 70mg , HDL > 40 mg.    Stress  Yes    Intervention  Offer individual and/or small group education and counseling on adjustment to heart disease, stress management and health-related lifestyle change. Teach and support self-help strategies.;Refer participants experiencing significant psychosocial distress to appropriate mental health specialists for further evaluation and treatment. When possible, include family members and significant others in education/counseling sessions.    Expected Outcomes  Short Term: Participant demonstrates changes in health-related behavior, relaxation and other stress management skills, ability to obtain effective social support, and compliance with psychotropic medications if prescribed.;Long Term: Emotional wellbeing is indicated by absence of clinically significant psychosocial distress or social isolation.       Core Components/Risk Factors/Patient Goals Review:    Core Components/Risk Factors/Patient Goals at Discharge (Final Review):    ITP Comments: ITP Comments    Row Name 04/25/17 0924           ITP Comments  Dr. Fransico Him, [facility]          Comments:  Zade started cardiac rehab today.  Pt tolerated light exercise without difficulty. VSS, telemetry-Sinus Rhythm with an inverted twave. Ronalee Belts did report having some shortness of breath, this has  Been an ongoing complaint since his stent placement otherwise asymptomatic.  Medication list reconciled. Pt denies barriers to medicaiton compliance.  PSYCHOSOCIAL ASSESSMENT:  PHQ-0. Pt exhibits positive coping skills, hopeful outlook with supportive family. No psychosocial needs identified at this time, no psychosocial interventions necessary.    Pt enjoys lifting weights(patient is not doing this now) playing golf, spending time with grandson and flying kites..   Pt oriented to exercise equipment and routine.    Understanding verbalized.Barnet Pall, RN,BSN 05/02/2017 9:52 AM

## 2017-05-03 ENCOUNTER — Ambulatory Visit (HOSPITAL_BASED_OUTPATIENT_CLINIC_OR_DEPARTMENT_OTHER): Payer: BLUE CROSS/BLUE SHIELD

## 2017-05-03 ENCOUNTER — Telehealth: Payer: Self-pay | Admitting: Cardiovascular Disease

## 2017-05-03 ENCOUNTER — Encounter (HOSPITAL_COMMUNITY)
Admission: RE | Admit: 2017-05-03 | Discharge: 2017-05-03 | Disposition: A | Discharge status: Home / Self Care | Payer: BLUE CROSS/BLUE SHIELD | Source: Ambulatory Visit | Attending: Cardiology | Admitting: Cardiology

## 2017-05-03 ENCOUNTER — Encounter (HOSPITAL_COMMUNITY): Payer: BLUE CROSS/BLUE SHIELD

## 2017-05-03 DIAGNOSIS — F17290 Nicotine dependence, other tobacco product, uncomplicated: Secondary | ICD-10-CM | POA: Diagnosis not present

## 2017-05-03 DIAGNOSIS — K219 Gastro-esophageal reflux disease without esophagitis: Secondary | ICD-10-CM | POA: Diagnosis not present

## 2017-05-03 DIAGNOSIS — I214 Non-ST elevation (NSTEMI) myocardial infarction: Secondary | ICD-10-CM | POA: Diagnosis not present

## 2017-05-03 DIAGNOSIS — M1990 Unspecified osteoarthritis, unspecified site: Secondary | ICD-10-CM | POA: Diagnosis not present

## 2017-05-03 DIAGNOSIS — Z7902 Long term (current) use of antithrombotics/antiplatelets: Secondary | ICD-10-CM | POA: Diagnosis not present

## 2017-05-03 DIAGNOSIS — Z8619 Personal history of other infectious and parasitic diseases: Secondary | ICD-10-CM | POA: Diagnosis not present

## 2017-05-03 DIAGNOSIS — E785 Hyperlipidemia, unspecified: Secondary | ICD-10-CM | POA: Diagnosis not present

## 2017-05-03 DIAGNOSIS — Z79899 Other long term (current) drug therapy: Secondary | ICD-10-CM | POA: Diagnosis not present

## 2017-05-03 DIAGNOSIS — Z7982 Long term (current) use of aspirin: Secondary | ICD-10-CM | POA: Diagnosis not present

## 2017-05-03 DIAGNOSIS — Z85828 Personal history of other malignant neoplasm of skin: Secondary | ICD-10-CM | POA: Diagnosis not present

## 2017-05-03 DIAGNOSIS — Z955 Presence of coronary angioplasty implant and graft: Secondary | ICD-10-CM

## 2017-05-03 NOTE — Telephone Encounter (Signed)
This has already been address by [facility] and Dr Claiborne Billings. All avenues have been exhausted with his insurance company and they are refusing to pay for a in-lab study. The patient needs to take this up with his insurance company. A HST has already been scheduled.

## 2017-05-03 NOTE — Telephone Encounter (Signed)
New message    They need Dr Claiborne Billings help with [facility] because it is not safe for Micheal to do sleep study at home

## 2017-05-03 NOTE — Progress Notes (Signed)
Reviewed home exercise guidelines with patient including endpoints, temperature precautions, target heart rate and rate of perceived exertion. Pt is walking and plans to resume aerobic exercise at a gym as his mode of home exercise.Pt eventually plans to return to outdoor biking as well for his exercise. Pt voices understanding of instructions given. Sol Passer, MS, ACSM CEP

## 2017-05-03 NOTE — Telephone Encounter (Signed)
Spoke with patient and he went to get Home Sleep study thing today. He does not feel comfortable or safe sleeping at home and not being monitored on his back. His fiance is currently on the phone with [employer] trying to have this issue addressed. Did give him Dr Evette Georges days in the office next week so insurance company can call and discuss with Dr Claiborne Billings. Reviewed chart and looks as though Dr Claiborne Billings did try to do a peer to peer and was unable to get anyone on the phone. Will forward to [facility] as he would like for her to be in touch next week with his fiance.

## 2017-05-03 NOTE — Telephone Encounter (Signed)
Follow up     Call [place] they want a peer to peer with [employer] , and he wants Dr Claiborne Billings cell number to speak with him.  He can not do the home sleep study he just had a heart attack and can not lay on his back and breathe.

## 2017-05-06 ENCOUNTER — Encounter (HOSPITAL_COMMUNITY): Payer: BLUE CROSS/BLUE SHIELD

## 2017-05-06 ENCOUNTER — Encounter (HOSPITAL_COMMUNITY)
Admission: RE | Admit: 2017-05-06 | Discharge: 2017-05-06 | Disposition: A | Discharge status: Home / Self Care | Payer: BLUE CROSS/BLUE SHIELD | Source: Ambulatory Visit | Attending: Cardiology | Admitting: Cardiology

## 2017-05-06 ENCOUNTER — Encounter (HOSPITAL_COMMUNITY): Payer: Self-pay | Admitting: *Deleted

## 2017-05-06 ENCOUNTER — Other Ambulatory Visit: Payer: Self-pay

## 2017-05-06 ENCOUNTER — Emergency Department (HOSPITAL_COMMUNITY): Payer: BLUE CROSS/BLUE SHIELD

## 2017-05-06 DIAGNOSIS — Z7902 Long term (current) use of antithrombotics/antiplatelets: Secondary | ICD-10-CM | POA: Diagnosis not present

## 2017-05-06 DIAGNOSIS — I25810 Atherosclerosis of coronary artery bypass graft(s) without angina pectoris: Secondary | ICD-10-CM | POA: Diagnosis not present

## 2017-05-06 DIAGNOSIS — Z79899 Other long term (current) drug therapy: Secondary | ICD-10-CM | POA: Insufficient documentation

## 2017-05-06 DIAGNOSIS — I5022 Chronic systolic (congestive) heart failure: Secondary | ICD-10-CM | POA: Insufficient documentation

## 2017-05-06 DIAGNOSIS — N179 Acute kidney failure, unspecified: Secondary | ICD-10-CM | POA: Insufficient documentation

## 2017-05-06 DIAGNOSIS — R0789 Other chest pain: Secondary | ICD-10-CM | POA: Diagnosis not present

## 2017-05-06 DIAGNOSIS — R079 Chest pain, unspecified: Principal | ICD-10-CM | POA: Insufficient documentation

## 2017-05-06 DIAGNOSIS — Z951 Presence of aortocoronary bypass graft: Secondary | ICD-10-CM | POA: Insufficient documentation

## 2017-05-06 DIAGNOSIS — I252 Old myocardial infarction: Secondary | ICD-10-CM | POA: Diagnosis not present

## 2017-05-06 DIAGNOSIS — Z96641 Presence of right artificial hip joint: Secondary | ICD-10-CM | POA: Insufficient documentation

## 2017-05-06 DIAGNOSIS — Z85828 Personal history of other malignant neoplasm of skin: Secondary | ICD-10-CM | POA: Insufficient documentation

## 2017-05-06 DIAGNOSIS — F419 Anxiety disorder, unspecified: Secondary | ICD-10-CM | POA: Insufficient documentation

## 2017-05-06 DIAGNOSIS — G4733 Obstructive sleep apnea (adult) (pediatric): Secondary | ICD-10-CM | POA: Insufficient documentation

## 2017-05-06 DIAGNOSIS — Z8619 Personal history of other infectious and parasitic diseases: Secondary | ICD-10-CM | POA: Diagnosis not present

## 2017-05-06 DIAGNOSIS — Z87891 Personal history of nicotine dependence: Secondary | ICD-10-CM | POA: Insufficient documentation

## 2017-05-06 DIAGNOSIS — R0602 Shortness of breath: Secondary | ICD-10-CM | POA: Diagnosis not present

## 2017-05-06 DIAGNOSIS — F17290 Nicotine dependence, other tobacco product, uncomplicated: Secondary | ICD-10-CM | POA: Diagnosis not present

## 2017-05-06 DIAGNOSIS — M1990 Unspecified osteoarthritis, unspecified site: Secondary | ICD-10-CM | POA: Diagnosis not present

## 2017-05-06 DIAGNOSIS — R42 Dizziness and giddiness: Secondary | ICD-10-CM | POA: Diagnosis not present

## 2017-05-06 DIAGNOSIS — N182 Chronic kidney disease, stage 2 (mild): Secondary | ICD-10-CM | POA: Insufficient documentation

## 2017-05-06 DIAGNOSIS — Z955 Presence of coronary angioplasty implant and graft: Secondary | ICD-10-CM | POA: Diagnosis not present

## 2017-05-06 DIAGNOSIS — I214 Non-ST elevation (NSTEMI) myocardial infarction: Secondary | ICD-10-CM

## 2017-05-06 DIAGNOSIS — E785 Hyperlipidemia, unspecified: Secondary | ICD-10-CM | POA: Insufficient documentation

## 2017-05-06 DIAGNOSIS — K219 Gastro-esophageal reflux disease without esophagitis: Secondary | ICD-10-CM | POA: Diagnosis not present

## 2017-05-06 DIAGNOSIS — R110 Nausea: Secondary | ICD-10-CM | POA: Diagnosis not present

## 2017-05-06 DIAGNOSIS — Z7982 Long term (current) use of aspirin: Secondary | ICD-10-CM | POA: Insufficient documentation

## 2017-05-06 LAB — BASIC METABOLIC PANEL
Anion gap: 11 (ref 5–15)
BUN: 28 mg/dL — ABNORMAL HIGH (ref 6–20)
CO2: 23 mmol/L (ref 22–32)
Calcium: 9.7 mg/dL (ref 8.9–10.3)
Chloride: 104 mmol/L (ref 101–111)
Creatinine, Ser: 1.37 mg/dL — ABNORMAL HIGH (ref 0.61–1.24)
GFR calc Af Amer: >60 mL/min (ref >60)
GFR calc non Af Amer: 55 mL/min — ABNORMAL LOW (ref >60)
Glucose, Bld: 95 mg/dL (ref 65–99)
Potassium: 4.3 mmol/L (ref 3.5–5.1)
Sodium: 138 mmol/L (ref 135–145)

## 2017-05-06 LAB — CBC
HCT: 46.8 % (ref 39.0–52.0)
Hemoglobin: 16 g/dL (ref 13.0–17.0)
MCH: 30.9 pg (ref 26.0–34.0)
MCHC: 34.2 g/dL (ref 30.0–36.0)
MCV: 90.3 fL (ref 78.0–100.0)
Platelets: 202 10*3/uL (ref 150–400)
RBC: 5.18 MIL/uL (ref 4.22–5.81)
RDW: 13.6 % (ref 11.5–15.5)
WBC: 7.8 10*3/uL (ref 4.0–10.5)

## 2017-05-06 LAB — I-STAT TROPONIN, ED: Troponin i, poc: 0.01 ng/mL (ref 0.00–0.08)

## 2017-05-06 NOTE — Telephone Encounter (Signed)
Pt spouse, Jenny Reichmann, walked into office today,  [place], to give [place] updates on the pt request for lab sleep study.  Pt refused to have home sleep study last week. On Friday Cindy had extensive conversations with [place], [place], and they reopened his case file. Jenny Reichmann was told to have Cardiologist office resubmit the pre-cert to them and then call to have MD do Peer to Peer.  Number given for peer to peer is [phone] and that we would be able to speak with someone right then.  We resubmitted the Pre-Cert, it is in process. RN Supervisor called Jenny Reichmann back to update on status and that [name] will call to do the peer to peer tomorrow 4/26. She was appreciative for the update. Told her we would update her on progress tomorrow after MD call to [facility]. She verbalized understanding.

## 2017-05-06 NOTE — ED Triage Notes (Signed)
Pt reports having recent MI in January, had stent placed. Having occ chest pains since the MI. Today had mid chest discomfort, neck pain and diaphoresis. Pt took two nitro pta which helped his pain. ekg done at triage and airway intact.

## 2017-05-06 NOTE — Telephone Encounter (Signed)
Spoke to the patient's fiance per the dpr. The patient was on the way to the beach when he started experiencing chest pain that radiated down his arm. He turned around and is now on the way to [facility]. Wannetta Sender has been notified via message.

## 2017-05-06 NOTE — Telephone Encounter (Signed)
New message  Pt fiance Jenny Reichmann verbalized that she is calling for instructions  Pt is not with her and pt complained to her that he is having active chest pains it has been going on for some days   She said that pt was on the way to the beach to get ready for their wedding   And he is turning around   I told her to please have pt go to the nearest Hospital or Urgent care  She said he will go to [facility]

## 2017-05-07 ENCOUNTER — Observation Stay (HOSPITAL_COMMUNITY)
Admission: EM | Admit: 2017-05-07 | Discharge: 2017-05-07 | Disposition: A | Discharge status: Home / Self Care | Payer: BLUE CROSS/BLUE SHIELD | Attending: Internal Medicine | Admitting: Internal Medicine

## 2017-05-07 ENCOUNTER — Encounter (HOSPITAL_COMMUNITY): Payer: Self-pay | Admitting: Family Medicine

## 2017-05-07 DIAGNOSIS — N182 Chronic kidney disease, stage 2 (mild): Secondary | ICD-10-CM | POA: Diagnosis present

## 2017-05-07 DIAGNOSIS — E785 Hyperlipidemia, unspecified: Secondary | ICD-10-CM | POA: Diagnosis not present

## 2017-05-07 DIAGNOSIS — R079 Chest pain, unspecified: Secondary | ICD-10-CM

## 2017-05-07 DIAGNOSIS — R0789 Other chest pain: Secondary | ICD-10-CM | POA: Diagnosis not present

## 2017-05-07 DIAGNOSIS — I25119 Atherosclerotic heart disease of native coronary artery with unspecified angina pectoris: Secondary | ICD-10-CM

## 2017-05-07 DIAGNOSIS — I252 Old myocardial infarction: Secondary | ICD-10-CM | POA: Diagnosis not present

## 2017-05-07 DIAGNOSIS — R0602 Shortness of breath: Secondary | ICD-10-CM | POA: Diagnosis not present

## 2017-05-07 DIAGNOSIS — I2510 Atherosclerotic heart disease of native coronary artery without angina pectoris: Secondary | ICD-10-CM | POA: Diagnosis present

## 2017-05-07 DIAGNOSIS — I25810 Atherosclerosis of coronary artery bypass graft(s) without angina pectoris: Secondary | ICD-10-CM | POA: Diagnosis not present

## 2017-05-07 DIAGNOSIS — F419 Anxiety disorder, unspecified: Secondary | ICD-10-CM

## 2017-05-07 DIAGNOSIS — R42 Dizziness and giddiness: Secondary | ICD-10-CM | POA: Diagnosis not present

## 2017-05-07 DIAGNOSIS — R110 Nausea: Secondary | ICD-10-CM | POA: Diagnosis not present

## 2017-05-07 LAB — I-STAT TROPONIN, ED: Troponin i, poc: 0.02 ng/mL (ref 0.00–0.08)

## 2017-05-07 LAB — BRAIN NATRIURETIC PEPTIDE: B Natriuretic Peptide: 78.9 pg/mL (ref 0.0–100.0)

## 2017-05-07 LAB — BASIC METABOLIC PANEL
Anion gap: 10 (ref 5–15)
BUN: 26 mg/dL — ABNORMAL HIGH (ref 6–20)
CO2: 20 mmol/L — ABNORMAL LOW (ref 22–32)
Calcium: 9 mg/dL (ref 8.9–10.3)
Chloride: 109 mmol/L (ref 101–111)
Creatinine, Ser: 1.04 mg/dL (ref 0.61–1.24)
GFR calc Af Amer: >60 mL/min (ref >60)
GFR calc non Af Amer: >60 mL/min (ref >60)
Glucose, Bld: 104 mg/dL — ABNORMAL HIGH (ref 65–99)
Potassium: 3.9 mmol/L (ref 3.5–5.1)
Sodium: 139 mmol/L (ref 135–145)

## 2017-05-07 LAB — HIV ANTIBODY (ROUTINE TESTING W REFLEX): HIV Screen 4th Generation wRfx: NONREACTIVE

## 2017-05-07 LAB — TROPONIN I
Troponin I: <0.03 ng/mL (ref <0.03)
Troponin I: <0.03 ng/mL (ref <0.03)

## 2017-05-07 LAB — D-DIMER, QUANTITATIVE: D-Dimer, Quant: 0.3 ug/mL-FEU (ref 0.00–0.50)

## 2017-05-07 MED ORDER — LISINOPRIL 2.5 MG PO TABS
2.5000 mg | ORAL_TABLET | Freq: Every day | ORAL | Status: DC
  Administered 2017-05-07: 2.5 mg via ORAL
  Filled 2017-05-07: qty 1

## 2017-05-07 MED ORDER — ONDANSETRON HCL 4 MG/2ML IJ SOLN: 4.0000 mg | Freq: Four times a day (QID) | INTRAMUSCULAR | Status: DC | PRN

## 2017-05-07 MED ORDER — ASPIRIN 81 MG PO CHEW
81.0000 mg | CHEWABLE_TABLET | Freq: Every day | ORAL | Status: DC
  Administered 2017-05-07: 81 mg via ORAL
  Filled 2017-05-07: qty 1

## 2017-05-07 MED ORDER — METOPROLOL SUCCINATE ER 25 MG PO TB24
25.0000 mg | ORAL_TABLET | Freq: Every day | ORAL | Status: DC
  Administered 2017-05-07: 25 mg via ORAL
  Filled 2017-05-07: qty 1

## 2017-05-07 MED ORDER — ALPRAZOLAM 0.25 MG PO TABS
0.2500 mg | ORAL_TABLET | Freq: Once | ORAL | Status: AC
Start: 2017-05-07 — End: 2017-05-07
  Administered 2017-05-07: 0.25 mg via ORAL
  Filled 2017-05-07: qty 1

## 2017-05-07 MED ORDER — ENOXAPARIN SODIUM 40 MG/0.4ML ~~LOC~~ SOLN
40.0000 mg | SUBCUTANEOUS | Status: DC
  Filled 2017-05-07: qty 0.4

## 2017-05-07 MED ORDER — NITROGLYCERIN 0.4 MG SL SUBL: 0.4000 mg | SUBLINGUAL_TABLET | SUBLINGUAL | Status: DC | PRN

## 2017-05-07 MED ORDER — GI COCKTAIL ~~LOC~~: 30.0000 mL | Freq: Three times a day (TID) | ORAL | Status: DC | PRN

## 2017-05-07 MED ORDER — ALPRAZOLAM 0.25 MG PO TABS: 0.2500 mg | ORAL_TABLET | Freq: Two times a day (BID) | ORAL | Status: DC | PRN

## 2017-05-07 MED ORDER — ACETAMINOPHEN 325 MG PO TABS: 650.0000 mg | ORAL_TABLET | ORAL | Status: DC | PRN

## 2017-05-07 MED ORDER — ISOSORBIDE MONONITRATE ER 30 MG PO TB24
30.0000 mg | ORAL_TABLET | Freq: Every day | ORAL | Status: DC
  Administered 2017-05-07: 30 mg via ORAL
  Filled 2017-05-07: qty 1

## 2017-05-07 MED ORDER — ASPIRIN 81 MG PO CHEW
324.0000 mg | CHEWABLE_TABLET | Freq: Once | ORAL | Status: AC
  Administered 2017-05-07: 324 mg via ORAL
  Filled 2017-05-07: qty 4

## 2017-05-07 MED ORDER — TICAGRELOR 90 MG PO TABS
90.0000 mg | ORAL_TABLET | Freq: Two times a day (BID) | ORAL | Status: DC
  Administered 2017-05-07: 90 mg via ORAL
  Filled 2017-05-07: qty 1

## 2017-05-07 MED ORDER — MORPHINE SULFATE (PF) 4 MG/ML IV SOLN: 2.0000 mg | INTRAVENOUS | Status: DC | PRN

## 2017-05-07 MED ORDER — ATORVASTATIN CALCIUM 80 MG PO TABS: 80.0000 mg | ORAL_TABLET | Freq: Every day | ORAL | Status: DC

## 2017-05-07 NOTE — ED Provider Notes (Signed)
[facility] EMERGENCY DEPARTMENT Provider Note   CSN: [id number] Arrival date & time: 05/06/17  1655     History   Chief Complaint Chief Complaint  Patient presents with  . Chest Pain    HPI James [name] is a 57 y.o. male.  Patient presents with episode of chest pain and pressure that onset today while he was driving.  He describes pain and pressure in the center of his chest that radiates to his neck causing a throat tightening, clamminess, nausea, numbness in his left arm.  This is similar to his previous angina type pain.  Patient received 1 stent in January.  He underwent a repeat catheterization in January that showed a patent stent and no other CAD.  Patient states ever since he had his catheterization has been having intermittent pain in the left side of his chest that comes and goes lasting for a few seconds at a time.  This pain is reproducible.  The pressure he had today is completely different and did resolve after 2 nitroglycerin.  He saw his cardiologist last week and everything was going well.  States compliance with his medications.   The history is provided by the patient.  Chest Pain   Associated symptoms include nausea and shortness of breath. Pertinent negatives include no abdominal pain, no dizziness, no fever, no headaches and no vomiting.    Past Medical History:  Diagnosis Date  . Acute systolic congestive heart failure ([place])   . Anemia    slight anemia with hepatitis tx  . Arthritis    oa  . 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 GERD (gastroesophageal reflux disease)   . Hepatitis C 04/29/2012   had savalta treatment 2 years ago, now cured  . Hyperlipidemia   . NSTEMI (non-ST elevated myocardial infarction) ([place]) 03/29/2017   1/19 PCI/DESx1 to mLAD, EF 45%  . Palpitations    at times     Patient Active Problem List   Diagnosis Date Noted  . CKD (chronic kidney disease), stage II  04/10/2017  . Mild renal insufficiency 04/10/2017  . Chest pain 04/10/2017  . Anxiety 04/10/2017  . CAD (coronary artery disease) 04/10/2017  . Acute systolic heart failure ([place]) 04/01/2017  . Hyperlipidemia 04/01/2017  . Hypokalemia 04/01/2017  . NSVT (nonsustained ventricular tachycardia) ([place]) 04/01/2017  . NSTEMI (non-ST elevated myocardial infarction) ([place]) 03/29/2017  . Overweight (BMI 25.0-29.9) 01/12/2015  . 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 Hepatitis C 04/29/2012  . Polycythemia 08/22/2011    Past Surgical History:  Procedure Laterality Date  . ARTHROSCOPIC REPAIR ACL Right 15 years ago  . CORONARY STENT INTERVENTION N/A 03/29/2017   Procedure: CORONARY STENT INTERVENTION;  Surgeon: Martinique, Peter M, MD;  Location: [facility] CV LAB;  Service: Cardiovascular;  Laterality: N/A;  . CORONARY THROMBECTOMY N/A 03/29/2017   Procedure: Coronary Thrombectomy;  Surgeon: Martinique, Peter M, MD;  Location: [facility] CV LAB;  Service: Cardiovascular;  Laterality: N/A;  . LEFT HEART CATH AND CORONARY ANGIOGRAPHY N/A 03/29/2017   Procedure: LEFT HEART CATH AND CORONARY ANGIOGRAPHY;  Surgeon: Martinique, Peter M, MD;  Location: [facility] [place] CV LAB;  Service: Cardiovascular;  Laterality: N/A;  . LEFT HEART CATH AND CORONARY ANGIOGRAPHY N/A 04/10/2017   Procedure: LEFT HEART CATH AND CORONARY ANGIOGRAPHY;  Surgeon: Martinique, Peter M, MD;  Location: [facility] CV LAB;  Service: Cardiovascular;  Laterality: N/A;  . TOTAL HIP ARTHROPLASTY Right  01/11/2015   Procedure: RIGHT TOTAL HIP ARTHROPLASTY ANTERIOR APPROACH;  Surgeon: Paralee Cancel, MD;  Location: [facility];  Service: Orthopedics;  Laterality: Right;       Home Medications    Prior to Admission medications   Medication Sig Start Date End Date Taking? Authorizing Provider  ALPRAZolam (XANAX) 0.25 MG tablet Take 1 tablet (0.25 mg total) by mouth 2 (two) times daily as needed for anxiety. 04/08/17  Yes Daleen Bo, MD  aspirin 81 MG tablet Take 1  tablet (81 mg total) by mouth daily. 03/31/17  Yes Bhagat, Bhavinkumar, PA  atorvastatin (LIPITOR) 80 MG tablet Take 1 tablet (80 mg total) by mouth daily at 6 PM. 04/09/17  Yes Almyra Deforest, PA  colchicine 0.6 MG tablet Take 1 tablet (0.6 mg total) by mouth 2 (two) times daily as needed (gout flare up). 04/09/17  Yes Almyra Deforest, PA  isosorbide mononitrate (IMDUR) 30 MG 24 hr tablet Take 1 tablet (30 mg total) by mouth daily. 04/30/17  Yes Almyra Deforest, PA  lisinopril (PRINIVIL,ZESTRIL) 2.5 MG tablet Take 1 tablet (2.5 mg total) by mouth daily. 04/09/17 11/05/17 Yes Almyra Deforest, PA  metoprolol succinate (TOPROL-XL) 25 MG 24 hr tablet Take 1 tablet (25 mg total) by mouth daily. 04/09/17  Yes Almyra Deforest, PA  nitroGLYCERIN (NITROSTAT) 0.4 MG SL tablet Place 1 tablet (0.4 mg total) under the tongue every 5 (five) minutes as needed. 03/31/17  Yes Bhagat, Bhavinkumar, PA  ticagrelor (BRILINTA) 90 MG TABS tablet Take 1 tablet (90 mg total) by mouth 2 (two) times daily. 04/09/17  Yes Almyra Deforest, PA  famotidine (PEPCID) 20 MG tablet Take 1 tablet (20 mg total) by mouth 2 (two) times daily. Patient not taking: Reported on 04/19/2017 04/11/17   Elmarie Shiley, MD    Family History Family History  Problem Relation Age of Onset  . Alzheimer's disease Mother   . Heart failure Father   . CAD Father     Social History Social History   Tobacco Use  . Smoking status: Former Smoker    Types: Cigars    Last attempt to quit: 03/29/2017    Years since quitting: 0.1  . Smokeless tobacco: Never Used  . Tobacco comment: cigar  Substance Use Topics  . Alcohol use: No  . Drug use: No     Allergies   Patient has no known allergies.   Review of Systems Review of Systems  Constitutional: Negative for activity change, appetite change and fever.  HENT: Negative for congestion and rhinorrhea.   Respiratory: Positive for chest tightness and shortness of breath.   Cardiovascular: Positive for chest pain.  Gastrointestinal:  Positive for nausea. Negative for abdominal pain and vomiting.  Genitourinary: Negative for dysuria and hematuria.  Musculoskeletal: Negative for arthralgias and myalgias.  Skin: Negative for rash.  Neurological: Positive for light-headedness. Negative for dizziness and headaches.   all other systems are negative except as noted in the HPI and PMH.     Physical Exam Updated Vital Signs BP 123/85 (BP Location: Right Arm)   Pulse 73   Temp 98.2 F (36.8 C) (Oral)   Resp 16   SpO2 98%   Physical Exam  Constitutional: He is oriented to person, place, and time. He appears well-developed and well-nourished. No distress.  HENT:  Head: Normocephalic and atraumatic.  Mouth/Throat: Oropharynx is clear and moist. No oropharyngeal exudate.  Eyes: Conjunctivae and EOM are normal. Pupils are equal, round, and reactive to light.  Neck: Normal range  of motion. Neck supple.  No meningismus.  Cardiovascular: Normal rate, regular rhythm, normal heart sounds and intact distal pulses.  No murmur heard. Pulmonary/Chest: Effort normal and breath sounds normal. No respiratory distress. He exhibits no tenderness.  Abdominal: Soft. There is no tenderness. There is no rebound and no guarding.  Musculoskeletal: Normal range of motion. He exhibits no edema or tenderness.  Neurological: He is alert and oriented to person, place, and time. No cranial nerve deficit. He exhibits normal muscle tone. Coordination normal.  No ataxia on finger to nose bilaterally. No pronator drift. 5/5 strength throughout. CN 2-12 intact.Equal grip strength. Sensation intact.   Skin: Skin is warm.  Psychiatric: He has a normal mood and affect. His behavior is normal.  Nursing note and vitals reviewed.    ED Treatments / Results  Labs (all labs ordered are listed, but only abnormal results are displayed) Labs Reviewed  BASIC METABOLIC PANEL - Abnormal; Notable for the following components:      Result Value   BUN 28 (*)     Creatinine, Ser 1.37 (*)    GFR calc non Af Amer 55 (*)    All other components within normal limits  BASIC METABOLIC PANEL - Abnormal; Notable for the following components:   CO2 20 (*)    Glucose, Bld 104 (*)    BUN 26 (*)    All other components within normal limits  CBC  D-DIMER, QUANTITATIVE (NOT AT [facility])  BRAIN NATRIURETIC PEPTIDE  TROPONIN I  HIV ANTIBODY (ROUTINE TESTING)  TROPONIN I  TROPONIN I  I-STAT TROPONIN, ED  I-STAT TROPONIN, ED    EKG  EKG Interpretation  Date/Time:  Monday May 06 2017 17:03:52 EST Ventricular Rate:  86 PR Interval:  154 QRS Duration: 76 QT Interval:  364 QTC Calculation: 435 R Axis:   26 Text Interpretation:  Sinus rhythm with Premature atrial complexes Anterior infarct , age undetermined T wave abnormality, consider inferolateral ischemia Abnormal ECG anterior and lateral T wave inversions Confirmed by Ezequiel Essex [phone]) on 05/07/2017 12:39:13 AM       Radiology Dg Chest 2 View  Result Date: 05/06/2017 CLINICAL DATA:  Recent MI in January, post stent placement, occasional chest pain since MI, mid chest discomfort with neck pain and diaphoresis today, some relief with nitroglycerin, history CHF, former smoker EXAM: CHEST  2 VIEW COMPARISON:  04/10/2017 FINDINGS: Normal heart size, mediastinal contours, and pulmonary vascularity. Lungs clear. No acute infiltrate, pleural effusion or pneumothorax. Bones unremarkable. IMPRESSION: No acute abnormalities. Electronically Signed   By: Lavonia Dana M.D.   On: 05/06/2017 17:39    Procedures Procedures (including critical care time)  Medications Ordered in ED Medications  aspirin chewable tablet 324 mg (not administered)     Initial Impression / Assessment and Plan / ED Course  I have reviewed the triage vital signs and the nursing notes.  Pertinent labs & imaging results that were available during my care of the patient were reviewed by me and considered in my medical decision  making (see chart for details).    Episode of central chest pain and pressure earlier that radiates to the neck associated with shortness of breath and clamminess.  EKG shows stable T wave inversions compared to previous consistent with evolving infarct.  Initial troponin is negative.  Patient is chest pain-free after 2 nitroglycerin now.  Catheterization in January showed patent stent and no other CAD.  He has part of the problem.  Troponin is 0.0 Dw. Dr.  Vedre of cardiology.  He agrees with serial enzymes and hospitalist admission.  Patient remains chest pain-free after 2 nitroglycerin.  He appears to have a component of anxiety as well.  He describes left-sided chest pain that is atypical for ACS that he receives on a daily basis.  However today's episode was more severe in the center of his chest and felt somewhat his previous MI.  Given his description, will admit for rule out that is reassuring patient has had a reassuring recent catheterization.  Admission discussed with Dr. Myna Hidalgo.  Final Clinical Impressions(s) / ED Diagnoses   Final diagnoses:  Chest pain, unspecified type    ED Discharge Orders    None       Tyrisha Benninger, Annie Main, MD 05/07/17 [phone]

## 2017-05-07 NOTE — Consult Note (Signed)
Cardiology Consultation:   Patient ID: James [name]; [id number]; 11/25/1960   Admit date: 05/07/2017 Date of Consult: 05/07/2017  Primary Care Provider: [name], MD Primary Cardiologist: Dr. [name] Primary Electrophysiologist:  None   Patient Profile:   James [name] is a 57 y.o. male with PMH of recent NSTEMI w/DES to Prox LAD on 03/29/17, polycythemia, hepatitis C, basal cell skin CA, and OSA, who is being seen today for the evaluation of chest pain at the request of Dr. Algis Liming.  History of Present Illness:   [name] was recently hospitalized 03/29/17 for NSTEMI with peak troponin 45.54. He underwent urgent cardiac catheterization which revealed a 100% proximal LAD lesion managed with thrombectomy and DES/PCI. Echocardiogram revealed an EF of 45%. He was started on aspirin, brilinta, and high dose statin, as well as a 85mo supply of xanax for significant anxiety. He continued to experience intermittent chest pain and was readmitted 04/10/17 with peak troponin 2.81. He underwent repeat cardiac catheterization which did not reveal any significant CAD and confirmed widely patent LAD stent. Patient was again discharged on aspirin, brilinta, high dose statin, and xanax; low dose imdur was started.   Patient was last seen outpatient by cardiology 04/30/17 and complained of continued throat tightness (initial anginal complaint), and left sided chest pain. His imdur was increased to 30mg  daily for possible coronary spasm.   Since his follow-up visit on 04/30/17, he has continued to have short burst of sharp upper/left sided chest pain. On 05/06/17 he was driving to [place] where he lives part-time when he developed chest and neck tightness, diaphoresis, and lightheadedness. While he typically does not experience central chest tightness or diaphoresis, his neck tightness if reminiscent of his MI 03/29/17. He decided to abort his trip and present to [facility] ED for further evaluation.    His chest discomfort lasted ~1 hour, improved with 2 SL nitro with minimal relief, and resolved completely after taking xanax. He denies SOB, DOE, dizziness, LE edema, orthopnea, PND. He notes compliance with his medications.   He expresses frustration for ongoing chest pain/neck tightness and acknowledges anxiety component. He states prior to his MI he was active, going to the gym, only taking a baby ASA daily. The lifestyle adjustment has been challenging for him. He now has a significant amount of anxiety and states when the chest pain started yesterday afternoon, his mind automatically starts thinking about worst case scenarios and lack of medical facilities in the area, which aggravate his symptoms. He has a hard time differentiating his anxiety from true cardiac events. He is interested in finding a support group and a therapist.   Hospital course: VSS. Labs notable for electrolytes wnl, Cr 1.37>1.04 (baseline), Hgb 16.0, PLT 202, Ddimer wnl, BNP 78.9, Trop 0.01>0.02> <0.03. EKG with SR with PACs, diffuse TW abnormalities; TWI anterolateral leads, no STD/E (no significant change from previous). CXR without acute findings. Patient was admitted to medicine with cardiology consulting for possible ACS.    Past Medical History:  Diagnosis Date  . Acute systolic congestive heart failure ([place])   . Anemia    slight anemia with hepatitis tx  . Arthritis    oa  . Cancer ([place]) 4 months ago   basal cell skin cancer removed from chest area  . GERD (gastroesophageal reflux disease)   . Hepatitis C 04/29/2012   had savalta treatment 2 years ago, now cured  . Hyperlipidemia   . NSTEMI (non-ST elevated myocardial infarction) ([place]) 03/29/2017  1/19 PCI/DESx1 to mLAD, EF 45%  . Palpitations    at times     Past Surgical History:  Procedure Laterality Date  . ARTHROSCOPIC REPAIR ACL Right 15 years ago  . CORONARY STENT INTERVENTION N/A 03/29/2017   Procedure: CORONARY STENT INTERVENTION;  Surgeon:  Martinique, Peter M, MD;  Location: [place] [facility];  Service: Cardiovascular;  Laterality: N/A;  . CORONARY THROMBECTOMY N/A 03/29/2017   Procedure: Coronary Thrombectomy;  Surgeon: Martinique, Peter M, MD;  Location: [place] [facility];  Service: Cardiovascular;  Laterality: N/A;  . LEFT HEART CATH AND CORONARY ANGIOGRAPHY N/A 03/29/2017   Procedure: LEFT HEART CATH AND CORONARY ANGIOGRAPHY;  Surgeon: Martinique, Peter M, MD;  Location: [facility];  Service: Cardiovascular;  Laterality: N/A;  . LEFT HEART CATH AND CORONARY ANGIOGRAPHY N/A 04/10/2017   Procedure: LEFT HEART CATH AND CORONARY ANGIOGRAPHY;  Surgeon: Martinique, Peter M, MD;  Location: [place] [facility];  Service: Cardiovascular;  Laterality: N/A;  . TOTAL HIP ARTHROPLASTY Right 01/11/2015   Procedure: RIGHT TOTAL HIP ARTHROPLASTY ANTERIOR APPROACH;  Surgeon: Paralee Cancel, MD;  Location: [facility];  Service: Orthopedics;  Laterality: Right;     Home Medications:  Prior to Admission medications   Medication Sig Start Date End Date Taking? Authorizing Provider  ALPRAZolam (XANAX) 0.25 MG tablet Take 1 tablet (0.25 mg total) by mouth 2 (two) times daily as needed for anxiety. 04/08/17  Yes Daleen Bo, MD  aspirin 81 MG tablet Take 1 tablet (81 mg total) by mouth daily. 03/31/17  Yes Bhagat, Bhavinkumar, PA  atorvastatin (LIPITOR) 80 MG tablet Take 1 tablet (80 mg total) by mouth daily at 6 PM. 04/09/17  Yes Almyra Deforest, PA  colchicine 0.6 MG tablet Take 1 tablet (0.6 mg total) by mouth 2 (two) times daily as needed (gout flare up). 04/09/17  Yes Almyra Deforest, PA  isosorbide mononitrate (IMDUR) 30 MG 24 hr tablet Take 1 tablet (30 mg total) by mouth daily. 04/30/17  Yes Almyra Deforest, PA  lisinopril (PRINIVIL,ZESTRIL) 2.5 MG tablet Take 1 tablet (2.5 mg total) by mouth daily. 04/09/17 11/05/17 Yes Almyra Deforest, PA  metoprolol succinate (TOPROL-XL) 25 MG 24 hr tablet Take 1 tablet (25 mg total) by mouth daily. 04/09/17  Yes Almyra Deforest, PA  nitroGLYCERIN  (NITROSTAT) 0.4 MG SL tablet Place 1 tablet (0.4 mg total) under the tongue every 5 (five) minutes as needed. 03/31/17  Yes Bhagat, Bhavinkumar, PA  ticagrelor (BRILINTA) 90 MG TABS tablet Take 1 tablet (90 mg total) by mouth 2 (two) times daily. 04/09/17  Yes Almyra Deforest, PA  famotidine (PEPCID) 20 MG tablet Take 1 tablet (20 mg total) by mouth 2 (two) times daily. Patient not taking: Reported on 04/19/2017 04/11/17   Elmarie Shiley, MD    Inpatient Medications: Scheduled Meds: . aspirin  81 mg Oral Daily  . atorvastatin  80 mg Oral q1800  . enoxaparin (LOVENOX) injection  40 mg Subcutaneous Q24H  . isosorbide mononitrate  30 mg Oral Daily  . lisinopril  2.5 mg Oral Daily  . metoprolol succinate  25 mg Oral Daily  . ticagrelor  90 mg Oral BID   Continuous Infusions:  PRN Meds: acetaminophen, ALPRAZolam, gi cocktail, morphine injection, nitroGLYCERIN, ondansetron (ZOFRAN) IV  Allergies:   No Known Allergies  Social History:   Social History   Socioeconomic History  . Marital status: Single    Spouse name: Not on file  . Number of children: Not on file  .  Years of education: Not on file  . Highest education level: Not on file  Social Needs  . Financial resource strain: Not on file  . Food insecurity - worry: Not on file  . Food insecurity - inability: Not on file  . Transportation needs - medical: Not on file  . Transportation needs - non-medical: Not on file  Occupational History  . Occupation:  Financial controller: [employer]  Tobacco Use  . Smoking status: Former Smoker    Types: Cigars    Last attempt to quit: 03/29/2017    Years since quitting: 0.1  . Smokeless tobacco: Never Used  . Tobacco comment: cigar  Substance and Sexual Activity  . Alcohol use: No  . Drug use: No  . Sexual activity: Not on file  Other Topics Concern  . Not on file  Social History Narrative  . Not on file    Family History:    Family History  Problem Relation Age of  Onset  . Alzheimer's disease Mother   . Heart failure Father   . CAD Father      ROS:  Please see the history of present illness.    All other ROS reviewed and negative.     Physical Exam/Data:   Vitals:   05/07/17 0230 05/07/17 0300 05/07/17 0425 05/07/17 0443  BP: 110/82 106/82 108/78   Pulse: 66 69 70   Resp: 19 20 16 16   Temp:   98 F (36.7 C)   TempSrc:    Oral  SpO2: 92% 92% 95%   Weight:    197 lb (89.4 kg)  Height:    5\' 8"  (1.727 m)    Intake/Output Summary (Last 24 hours) at 05/07/2017 0806 Last data filed at 05/07/2017 0400 Gross per 24 hour  Intake 0 ml  Output -  Net 0 ml   Filed Weights   05/07/17 0443  Weight: 197 lb (89.4 kg)   Body mass index is 29.95 kg/m.  General:  Well nourished, well developed, laying in bed in no acute distress HEENT: sclera anicteric  Neck: no JVD Vascular: No carotid bruits; distal pulses 2+ bilaterally Cardiac:  normal S1, S2; RRR; no murmur, gallops, or rubs Lungs:  clear to auscultation bilaterally, no wheezing, rhonchi or rales  Abd: NABS, soft, nontender, no hepatomegaly Ext: no edema Musculoskeletal:  No deformities, BUE and BLE strength normal and equal Skin: warm and dry  Neuro:  CNs 2-12 intact, no focal abnormalities noted Psych:  Normal affect, mildly anxious  EKG:  The EKG was personally reviewed and demonstrates:  SR with PACs, diffuse TW abnormalities; TWI anterolateral leads, no STD/E. Telemetry:  Telemetry was personally reviewed and demonstrates:  NSR  Relevant CV Studies:  Cath 04/10/2017: Conclusion     Previously placed Prox LAD stent (unknown type) is widely patent.  LV end diastolic pressure is normal.   1. No significant obstructive CAD. The stent in the LAD is widely patent. 2. Normal LVEDP  Plan: continue medical therapy.    Limited Echo 04/10/2017: Study Conclusions  - Left ventricle: Poor acoustical windows and some forshortened   views limit assessment of wall motion.  Overall LVF appears   preserved with EF 55-60%. Images in the parasternal and apical 3   chamber views are suggestive of severe hypokinesis to akinesis of   the mid anteroseptal and apical anterior walls. Suggest limited   study with definity contrast to define further if clinically   indicated. The  cavity size was normal. There was moderate   concentric hypertrophy. Images were inadequate for LV wall motion   assessment. The study is not technically sufficient to allow   evaluation of LV diastolic function. - Pulmonary arteries: Systolic pressure could not be accurately   estimated.   Cath 03/29/2017 Conclusion     Prox LAD lesion is 100% stenosed.  Post intervention, there is a 0% residual stenosis.  A drug-eluting stent was successfully placed using a STENT SYNERGY DES 3.5X16.  The left ventricular systolic function is normal.  LV end diastolic pressure is normal.  The left ventricular ejection fraction is 45-50% by visual estimate.  1. Single vessel occlusive CAD- 100% mid LAD with collaterals 2. Mild LV dysfunction. EF 45% 3. Normal LVEDP 4. Successful aspiration thrombectomy and stenting of the mid LAD with DES   Plan: DAPT with ASA and Brilinta. IV Aggrastat for 18 hours. May be a candidate for DC Sunday if no complications. High dose statin.    Echo 03/30/2017 LV EF: 40% - 45% Study Conclusions - Left ventricle: Septal and apical akinesis inferior wall hypokinesis The cavity size was mildly dilated. Wall thickness was increased in a pattern of mild LVH. Systolic function was mildly to moderately reduced. The estimated ejection fraction was in the range of 40% to 45%. Left ventricular diastolic function parameters were normal. - Atrial septum: No defect or patent foramen ovale was identified.     Laboratory Data:  Chemistry Recent Labs  Lab 05/06/17 1714 05/07/17 0332  NA 138 139  K 4.3 3.9  CL 104 109  CO2 23 20*  GLUCOSE 95 104*    BUN 28* 26*  CREATININE 1.37* 1.04  CALCIUM 9.7 9.0  GFRNONAA 55* >60  GFRAA >60 >60  ANIONGAP 11 10    No results for input(s): PROT, ALBUMIN, AST, ALT, ALKPHOS, BILITOT in the last 168 hours. Hematology Recent Labs  Lab 05/06/17 1714  WBC 7.8  RBC 5.18  HGB 16.0  HCT 46.8  MCV 90.3  MCH 30.9  MCHC 34.2  RDW 13.6  PLT 202   Cardiac Enzymes Recent Labs  Lab 05/07/17 0332  TROPONINI <0.03    Recent Labs  Lab 05/06/17 1742 05/07/17 0137  TROPIPOC 0.01 0.02    BNP Recent Labs  Lab 05/07/17 0144  BNP 78.9    DDimer  Recent Labs  Lab 05/07/17 0144  DDIMER 0.30    Radiology/Studies:  Dg Chest 2 View  Result Date: 05/06/2017 CLINICAL DATA:  Recent MI in January, post stent placement, occasional chest pain since MI, mid chest discomfort with neck pain and diaphoresis today, some relief with nitroglycerin, history CHF, former smoker EXAM: CHEST  2 VIEW COMPARISON:  04/10/2017 FINDINGS: Normal heart size, mediastinal contours, and pulmonary vascularity. Lungs clear. No acute infiltrate, pleural effusion or pneumothorax. Bones unremarkable. IMPRESSION: No acute abnormalities. Electronically Signed   By: Lavonia Dana M.D.   On: 05/06/2017 17:39    Assessment and Plan:   1. Atypical chest pain: patient with recent NSTEMI 1/18 s/p DES/PCI to pLAD and readmission 1/30 with repeat [facility] revealing patent stent and non-obstructive CAD. Patient continues to have atypical CP with sharp left upper chest wall pain lasting for seconds and not associated with activity. On 05/06/17 he experienced chest tightness radiating to neck with associated diaphoresis and lightheadedness while driving to [employer]. He has a significant amount of anxiety with regards to recent change in health. Troponins have been negative and EKG is without significant change  from previous. Suspect this is anxiety driven. Patient asking about support groups for post-MI patients and interested in seeing a therapist  to help with coping.  - No further ischemic work-up indicated at this time - Continue ASA, brilinta, statin, and imdur (tolerating higher dose without complications) - Consider referral to psychology at discharge - Continue xanax prn  2. Chronic systolic heart failure: euvolemic on exam. BNP wnl. CXR negative for acute findings - Continue metoprolol and lisinopril  3. Anxiety: Anxiety exacerbated by MI and sudden change in lifestyle. Patient having a difficult time differentiating anxiety symptoms from true cardiac event.  - Consider referral to psychology at discharge - Continue xanax prn - May benefit from long acting anxiety medication - SSRI?  4. CKD stage II: Cr 1.04 today - Continue to monitor and avoid nephrotoxic agents  Anticipate patient will be stable for discharge home from a Cardiology standpoint. Has appt to see [name] 4/30 at 3:20pm.      For questions or updates, please contact [facility] Please consult [email or web address] for contact info under Cardiology/STEMI.   Signed, Abigail Butts, PA-C  05/07/2017 8:06 AM [phone]

## 2017-05-07 NOTE — Discharge Summary (Signed)
Physician Discharge Summary  James [name] SNK:[id number] DOB: May 04, 1960  PCP: [name], MD  Admit date: 05/07/2017 Discharge date: 05/07/2017  Recommendations for Outpatient Follow-up:  1. Dr. [name], PCP. 2. Recommend psychiatry/psychology consultation as outpatient for evaluation and management of anxiety. 3. Dr. [name], Cardiology on 07/09/17 at 3:20 PM.  Advised to keep prior appointment.  Home Health: None Equipment/Devices: None  Discharge Condition: Improved and stable CODE STATUS: Full Diet recommendation: Heart healthy diet.  Discharge Diagnoses:  Principal Problem:   Chest pain Active Problems:   CKD (chronic kidney disease), stage II   CAD (coronary artery disease)   Brief Summary: 57 year old married male, physically quite active, PMH of NSTEMI and recent DES to the LAD on 03/29/17 with repeat catheter due to chest pain that showed a patent stent and nonobstructive CAD, EF 45%, GERD, treated hepatitis C, HLD, OSA being evaluated by [name], stage II chronic kidney disease, anxiety, presented to the ED with complaints of chest pain.  Patient reports that since his stent, he has had some form of chest discomfort almost on a daily basis.  He is quite anxious about this.  On 05/06/17, around noon time, he started experiencing some epigastric tightness while he was loading his truck to drive to [name].  This was mild and nonradiating.  As he started to drive, he started feeling uncomfortable, throat tightness, diaphoresis at which point he got worried and decided to turn around and return to [name].  On route he took 2 sublingual NTG with improvement in his symptoms and eventually took Xanax when he was 5 miles away from the hospital and by the time he reached the ED, his chest pain had completely resolved.  He was admitted for observation and evaluation.  Cardiology was consulted.  Assessment and plan:  1. Chest pain, atypical: Ongoing  intermittent atypical different areas chest discomfort since stent placement mid January.  He had repeat cath which showed patent stent and nonobstructive CAD.  Has followed with outpatient cardiology and Imdur recently increased.  Description of chest pain is atypical.  Also not typical for GERD symptoms.  Denies alcohol use or smoking.  Troponin x 4 negative.  D-dimer negative.  Telemetry without arrhythmias.  EKG unremarkable.  Cardiology has seen and indicated that his chest pain is noncardiac in nature and suspect anxiety is a big component.  Cardiology has cleared him for discharge home and felt reasonable to consider outpatient psychology/psychiatric evaluation for his anxiety component.  This was discussed in detail with patient and spouse and they verbalized understanding.  Patient has also been advised to try Pepcid again for a couple of weeks to see if this helps his symptoms.  Currently without chest pain. 2. Anxiety: Continue as needed Xanax as PTA and consider outpatient psychiatry/psychology evaluation. 3. CAD status post DES to LAD 03/29/17: Has had repeat cath for recurrent chest pain which showed patent stent and nonobstructive CAD.  Continue all cardiac medications including aspirin, Brilinta, atorvastatin, Imdur, lisinopril and metoprolol.  Has outpatient follow-up with cardiology. 4. Ischemic cardiomyopathy/chronic systolic CHF: Euvolemic.  BNP normal.  Continue metoprolol and lisinopril.  TTE 04/10/17: LVEF 55-60%. 5. OSA: Patient reports that this is being evaluated by [name], Cardiology.  Not on CPAP at this time.  Spouse confirms apneic spells while sleeping. 6. Acute on stage II chronic kidney disease: Presented with creatinine of 1.37 which has normalized to 1.04.   Consultations:  Cardiology  Procedures:  None   Discharge  Instructions  Discharge Instructions    Activity as tolerated - No restrictions   Complete by:  As directed    Call MD for:  difficulty breathing,  headache or visual disturbances   Complete by:  As directed    Call MD for:  extreme fatigue   Complete by:  As directed    Call MD for:  persistant dizziness or light-headedness   Complete by:  As directed    Call MD for:  severe uncontrolled pain   Complete by:  As directed    Diet - low sodium heart healthy   Complete by:  As directed        Medication List    TAKE these medications   ALPRAZolam 0.25 MG tablet Commonly known as:  XANAX Take 1 tablet (0.25 mg total) by mouth 2 (two) times daily as needed for anxiety.   aspirin 81 MG tablet Take 1 tablet (81 mg total) by mouth daily.   atorvastatin 80 MG tablet Commonly known as:  LIPITOR Take 1 tablet (80 mg total) by mouth daily at 6 PM.   colchicine 0.6 MG tablet Take 1 tablet (0.6 mg total) by mouth 2 (two) times daily as needed (gout flare up).   famotidine 20 MG tablet Commonly known as:  PEPCID Take 1 tablet (20 mg total) by mouth 2 (two) times daily.   isosorbide mononitrate 30 MG 24 hr tablet Commonly known as:  IMDUR Take 1 tablet (30 mg total) by mouth daily.   lisinopril 2.5 MG tablet Commonly known as:  PRINIVIL,ZESTRIL Take 1 tablet (2.5 mg total) by mouth daily.   metoprolol succinate 25 MG 24 hr tablet Commonly known as:  TOPROL-XL Take 1 tablet (25 mg total) by mouth daily.   nitroGLYCERIN 0.4 MG SL tablet Commonly known as:  NITROSTAT Place 1 tablet (0.4 mg total) under the tongue every 5 (five) minutes as needed.   ticagrelor 90 MG Tabs tablet Commonly known as:  BRILINTA Take 1 tablet (90 mg total) by mouth 2 (two) times daily.      Follow-up Information    [name], MD. Schedule an appointment as soon as possible for a visit.   Specialty:  Family Medicine Contact information: [name] [address] [phone]        Martinique, Peter M, MD Follow up on 07/09/2017.   Specialty:  Cardiology Why:  Keep your prior appointment on 4/30 at 3:20pm.  Contact  information: [name] [address] [phone]          No Known Allergies    Procedures/Studies: Dg Chest 2 [name]  Result Date: 05/06/2017 CLINICAL DATA:  Recent MI in January, post stent placement, occasional chest pain since MI, mid chest discomfort with neck pain and diaphoresis today, some relief with nitroglycerin, history CHF, former smoker EXAM: CHEST  2 [name] COMPARISON:  04/10/2017 FINDINGS: Normal heart size, mediastinal contours, and pulmonary vascularity. Lungs clear. No acute infiltrate, pleural effusion or pneumothorax. Bones unremarkable. IMPRESSION: No acute abnormalities. Electronically Signed   By: Lavonia Dana M.D.   On: 05/06/2017 17:39      Subjective: No recurrence of chest pain since hospital admission.  Rest of history as above.  Discharge Exam:  Vitals:   05/07/17 0300 05/07/17 0425 05/07/17 0443 05/07/17 1021  BP: 106/82 108/78  122/79  Pulse: 69 70  73  Resp: 20 16 16    Temp:  98 F (36.7 C)    TempSrc:  Oral   SpO2: 92% 95%    Weight:   89.4 kg (197 lb)   Height:   5\' 8"  (1.727 m)     General: Pt lying comfortably in bed & appears in no obvious distress.  [place] young male, well built and nourished. Cardiovascular: S1 & S2 heard, RRR, S1/S2 +. No murmurs, rubs, gallops or clicks. No JVD or pedal edema.  Telemetry personally reviewed: Sinus rhythm. Respiratory: Clear to auscultation without wheezing, rhonchi or crackles. No increased work of breathing.  No reproducible chest wall tenderness. Abdominal:  Non distended, non tender & soft. No organomegaly or masses appreciated. Normal bowel sounds heard. CNS: Alert and oriented. No focal deficits. Extremities: no edema, no cyanosis    The results of significant diagnostics from this hospitalization (including imaging, microbiology, ancillary and laboratory) are listed below for reference.     Labs: CBC: Recent Labs  Lab 05/06/17 1714  WBC 7.8  HGB 16.0  HCT  46.8  MCV 90.3  PLT 092   Basic Metabolic Panel: Recent Labs  Lab 05/06/17 1714 05/07/17 0332  NA 138 139  K 4.3 3.9  CL 104 109  CO2 23 20*  GLUCOSE 95 104*  BUN 28* 26*  CREATININE 1.37* 1.04  CALCIUM 9.7 9.0   BNP (last 3 results) Recent Labs    05/07/17 0144  BNP 78.9   Cardiac Enzymes: Recent Labs  Lab 05/07/17 0332 05/07/17 0935  TROPONINI <0.03 <0.03   Discussed in detail with patient's spouse at bedside.  Updated care and answered questions.    Time coordinating discharge: Over 30 minutes  SIGNED:  Vernell Leep, MD, FACP, [facility]. [facility] Pager [phone]  If 7PM-7AM, please contact night-coverage [email or web address] Password [email or web address] 05/07/2017, 11:01 AM

## 2017-05-07 NOTE — Care Management Note (Signed)
Case Management Note  Patient Details  Name: DESHAN HEMMELGARN MRN: [id number] Date of Birth: [birth year]  Subjective/Objective:  Pt presented for Chest Pain- PTA from home with family support. Pt lives in [place] and also has a place in [place].                   Action/Plan: Pt in need of information about Cardiac Support Groups. CM did provide pt with information for Quarterly Meetings at [facility] for Cardiac Families Support Groups. Patient appreciative of information. No further needs from CM at this time.    Expected Discharge Date:  05/07/17               Expected Discharge Plan:  Home/Self Care  In-House Referral:  NA  Discharge planning Services  CM Consult  Post Acute Care Choice:  NA Choice offered to:  Patient  DME Arranged:  N/A DME Agency:  NA  HH Arranged:  NA HH Agency:  NA  Status of Service:  Completed, signed off  If discussed at [place] of Stay Meetings, dates discussed:    Additional Comments:  Bethena Roys, RN 05/07/2017, 11:25 AM

## 2017-05-07 NOTE — Telephone Encounter (Signed)
[name] called and spoke to MD at [facility].   [employer] will not approved in lab study but will approve for Autopap machine-therefore we will proceed with with getting patient set up for therapy at home.    [employer] will call back today or tomorrow with authorization number

## 2017-05-07 NOTE — Progress Notes (Signed)
The patient and his wife have been given discharge instructions along with a medication list and what to take today. He has follow up appointments to make. He is discharging with his wife via car.   Saddie Benders RN

## 2017-05-07 NOTE — Telephone Encounter (Signed)
Spoke to fiance, aware of plan and verbalized understanding.   Will update once authorization is received.

## 2017-05-07 NOTE — H&P (Signed)
History and Physical    James [name] EVO:[id number] DOB: 11-24-1960 DOA: 05/07/2017  PCP: [name], MD   Patient coming from: Home  Chief Complaint: Chest pain   HPI: James [name] is a 57 y.o. male with medical history significant for chronic kidney disease stage II, anxiety, and coronary artery disease with stent, now presenting to the emergency department for evaluation of chest pain.  Patient suffered an MI in mid January,  treated with DES to LAD, and was readmitted in late January with recurrent chest pain.  He underwent a repeat cath on April 10, 2017 with no significant obstructive CAD and a widely patent LAD stent.  Since that time, he has continued to have chest pain daily.  He describes different types of chest pain that he has been experiencing and reports that earlier today he had pain that was very similar to what he had experienced with his MI, just not as severe.  He was at rest at time of onset, but had been active earlier in the day.  Pain improved significantly after nitroglycerin x2 at home.  He called his cardiology clinic and was advised to seek further evaluation at the nearest hospital or urgent care.  ED Course: Upon arrival to the ED, patient is found to be afebrile, saturating well on room air, and with peers a sinus rhythm with PAC and ST-T abnormality in the inferolateral leads.  Chest x-ray is negative for acute cardiopulmonary disease.  Chemistry panel is notable for a creatinine 1.37, consistent with his priors.  CBC is unremarkable.  D-dimer is within normal limits.  Troponin is normal.  Patient was treated with 324 mg of aspirin in the ED, remains hemodynamically stable, has not been in any respiratory distress, and will be observed on the telemetry unit for ongoing evaluation and management of chest pain.   Review of Systems:  All other systems reviewed and apart from HPI, are negative.  Past Medical History:  Diagnosis Date  . Acute systolic  congestive heart failure ([place])   . Anemia    slight anemia with hepatitis tx  . Arthritis    oa  . Cancer ([place]) 4 months ago   basal cell skin cancer removed from chest area  . GERD (gastroesophageal reflux disease)   . Hepatitis C 04/29/2012   had savalta treatment 2 years ago, now cured  . Hyperlipidemia   . NSTEMI (non-ST elevated myocardial infarction) ([place]) 03/29/2017   1/19 PCI/DESx1 to mLAD, EF 45%  . Palpitations    at times     Past Surgical History:  Procedure Laterality Date  . ARTHROSCOPIC REPAIR ACL Right 15 years ago  . CORONARY STENT INTERVENTION N/A 03/29/2017   Procedure: CORONARY STENT INTERVENTION;  Surgeon: Martinique, Peter M, MD;  Location: [place] [facility];  Service: Cardiovascular;  Laterality: N/A;  . CORONARY THROMBECTOMY N/A 03/29/2017   Procedure: Coronary Thrombectomy;  Surgeon: Martinique, Peter M, MD;  Location: [place] [facility];  Service: Cardiovascular;  Laterality: N/A;  . LEFT HEART CATH AND CORONARY ANGIOGRAPHY N/A 03/29/2017   Procedure: LEFT HEART CATH AND CORONARY ANGIOGRAPHY;  Surgeon: Martinique, Peter M, MD;  Location: [place] [facility];  Service: Cardiovascular;  Laterality: N/A;  . LEFT HEART CATH AND CORONARY ANGIOGRAPHY N/A 04/10/2017   Procedure: LEFT HEART CATH AND CORONARY ANGIOGRAPHY;  Surgeon: Martinique, Peter M, MD;  Location: [place] [facility];  Service: Cardiovascular;  Laterality: N/A;  . TOTAL HIP ARTHROPLASTY Right 01/11/2015  Procedure: RIGHT TOTAL HIP ARTHROPLASTY ANTERIOR APPROACH;  Surgeon: Paralee Cancel, MD;  Location: [facility];  Service: Orthopedics;  Laterality: Right;     reports that he quit smoking about 5 weeks ago. His smoking use included cigars. he has never used smokeless tobacco. He reports that he does not drink alcohol or use drugs.  No Known Allergies  Family History  Problem Relation Age of Onset  . Alzheimer's disease Mother   . Heart failure Father   . CAD Father      Prior to Admission medications   Medication  Sig Start Date End Date Taking? Authorizing Provider  ALPRAZolam (XANAX) 0.25 MG tablet Take 1 tablet (0.25 mg total) by mouth 2 (two) times daily as needed for anxiety. 04/08/17  Yes Daleen Bo, MD  aspirin 81 MG tablet Take 1 tablet (81 mg total) by mouth daily. 03/31/17  Yes Bhagat, Bhavinkumar, PA  atorvastatin (LIPITOR) 80 MG tablet Take 1 tablet (80 mg total) by mouth daily at 6 PM. 04/09/17  Yes Almyra Deforest, PA  colchicine 0.6 MG tablet Take 1 tablet (0.6 mg total) by mouth 2 (two) times daily as needed (gout flare up). 04/09/17  Yes Almyra Deforest, PA  isosorbide mononitrate (IMDUR) 30 MG 24 hr tablet Take 1 tablet (30 mg total) by mouth daily. 04/30/17  Yes Almyra Deforest, PA  lisinopril (PRINIVIL,ZESTRIL) 2.5 MG tablet Take 1 tablet (2.5 mg total) by mouth daily. 04/09/17 11/05/17 Yes Almyra Deforest, PA  metoprolol succinate (TOPROL-XL) 25 MG 24 hr tablet Take 1 tablet (25 mg total) by mouth daily. 04/09/17  Yes Almyra Deforest, PA  nitroGLYCERIN (NITROSTAT) 0.4 MG SL tablet Place 1 tablet (0.4 mg total) under the tongue every 5 (five) minutes as needed. 03/31/17  Yes Bhagat, Bhavinkumar, PA  ticagrelor (BRILINTA) 90 MG TABS tablet Take 1 tablet (90 mg total) by mouth 2 (two) times daily. 04/09/17  Yes Almyra Deforest, PA  famotidine (PEPCID) 20 MG tablet Take 1 tablet (20 mg total) by mouth 2 (two) times daily. Patient not taking: Reported on 04/19/2017 04/11/17   Elmarie Shiley, MD    Physical Exam: Vitals:   05/07/17 0125 05/07/17 0130 05/07/17 0200 05/07/17 0230  BP: 123/85 118/86 117/81 110/82  Pulse: 73 68 65 66  Resp: 16 14 16 19   Temp:      TempSrc:      SpO2: 98% 94% 95% 92%      Constitutional: NAD, anxious  Eyes: PERTLA, lids and conjunctivae normal ENMT: Mucous membranes are moist. Posterior pharynx clear of any exudate or lesions.   Neck: normal, supple, no masses, no thyromegaly Respiratory: clear to auscultation bilaterally, no wheezing, no crackles. Normal respiratory effort.      Cardiovascular: S1 & S2 heard, regular rate and rhythm. No extremity edema. No significant JVD. Abdomen: No distension, no tenderness, no masses palpated. Bowel sounds normal.  Musculoskeletal: no clubbing / cyanosis. No joint deformity upper and lower extremities.    Skin: no significant rashes, lesions, ulcers. Warm, dry, well-perfused. Neurologic: CN 2-12 grossly intact. Sensation intact. Strength 5/5 in all 4 limbs.  Psychiatric: Alert and oriented x 3. Anxious. Cooperative.     Labs on Admission: I have personally reviewed following labs and imaging studies  CBC: Recent Labs  Lab 05/06/17 1714  WBC 7.8  HGB 16.0  HCT 46.8  MCV 90.3  PLT 161   Basic Metabolic Panel: Recent Labs  Lab 05/06/17 1714  NA 138  K 4.3  CL 104  CO2  23  GLUCOSE 95  BUN 28*  CREATININE 1.37*  CALCIUM 9.7   GFR: Estimated Creatinine Clearance: 64.7 mL/min (A) (by C-G formula based on SCr of 1.37 mg/dL (H)). Liver Function Tests: No results for input(s): AST, ALT, ALKPHOS, BILITOT, PROT, ALBUMIN in the last 168 hours. No results for input(s): LIPASE, AMYLASE in the last 168 hours. No results for input(s): AMMONIA in the last 168 hours. Coagulation Profile: No results for input(s): INR, PROTIME in the last 168 hours. Cardiac Enzymes: No results for input(s): CKTOTAL, CKMB, CKMBINDEX, TROPONINI in the last 168 hours. BNP (last 3 results) No results for input(s): PROBNP in the last 8760 hours. HbA1C: No results for input(s): HGBA1C in the last 72 hours. CBG: No results for input(s): GLUCAP in the last 168 hours. Lipid Profile: No results for input(s): CHOL, HDL, LDLCALC, TRIG, CHOLHDL, LDLDIRECT in the last 72 hours. Thyroid Function Tests: No results for input(s): TSH, T4TOTAL, FREET4, T3FREE, THYROIDAB in the last 72 hours. Anemia Panel: No results for input(s): VITAMINB12, FOLATE, FERRITIN, TIBC, IRON, RETICCTPCT in the last 72 hours. Urine analysis:    Component Value Date/Time    COLORURINE YELLOW 01/06/2015 [place] 01/06/2015 1459   LABSPEC 1.022 01/06/2015 1459   PHURINE 5.0 01/06/2015 1459   GLUCOSEU NEGATIVE 01/06/2015 1459   HGBUR NEGATIVE 01/06/2015 1459   BILIRUBINUR NEGATIVE 01/06/2015 1459   KETONESUR NEGATIVE 01/06/2015 1459   PROTEINUR NEGATIVE 01/06/2015 1459   UROBILINOGEN 0.2 01/06/2015 1459   NITRITE NEGATIVE 01/06/2015 1459   LEUKOCYTESUR NEGATIVE 01/06/2015 1459   Sepsis Labs: @LABRCNTIP (procalcitonin:4,lacticidven:4) )No results found for this or any previous visit (from the past 240 hour(s)).   Radiological Exams on Admission: Dg Chest 2 View  Result Date: 05/06/2017 CLINICAL DATA:  Recent MI in January, post stent placement, occasional chest pain since MI, mid chest discomfort with neck pain and diaphoresis today, some relief with nitroglycerin, history CHF, former smoker EXAM: CHEST  2 VIEW COMPARISON:  04/10/2017 FINDINGS: Normal heart size, mediastinal contours, and pulmonary vascularity. Lungs clear. No acute infiltrate, pleural effusion or pneumothorax. Bones unremarkable. IMPRESSION: No acute abnormalities. Electronically Signed   By: Lavonia Dana M.D.   On: 05/06/2017 17:39    EKG: Independently reviewed. Sinus rhythm, inferolateral ST-T abnormalities similar to prior.   Assessment/Plan   1. Chest pain; CAD  - Presents with chest pain while at rest, described as similar to recent MI but not as severe, eased-off after treatment with NTG x2 at home - He had MI in mid-January, treated with DES to LAD, admitted with chest pain again and underwent cath on 04/10/17 with no significant obstructive CAD and widely-patent stent  - CXR is unremarkable, troponin wnl, EKG with inferolateral ST-T abnormalities similar to prior    - Treated in ED with ASA 324 mg   - Continue cardiac monitoring, repeat EKG, obtain serial troponin measurements, continue ASA, Brilinta, Lipitor, lisinopril, and Toprol   2. Anxiety  - Continue  low-dose prn Xanax    3. CKD stage II  - SCr is 1.37 on admission, similar to priors  - Renally-dose medications, avoid nephrotoxins    DVT prophylaxis: Lovenox Code Status: Full  Family Communication: Significant other updated at bedside Disposition Plan: Observe on telemetry Consults called: EDP discussed with cardiology  Admission status: Observation    Vianne Bulls, MD [facility] Pager [phone]  If 7PM-7AM, please contact night-coverage [email or web address] Password [facility]  05/07/2017, 3:16 AM

## 2017-05-07 NOTE — Discharge Instructions (Signed)
Please get your medications reviewed and adjusted by your Primary MD. ° °Please request your Primary MD to go over all Hospital Tests and Procedure/Radiological results at the follow up, please get all Hospital records sent to your Prim MD by signing hospital release before you go home. ° °If you had Pneumonia of Lung problems at the Hospital: °Please get a 2 view Chest X ray done in 6-8 weeks after hospital discharge or sooner if instructed by your Primary MD. ° °If you have Congestive Heart Failure: °Please call your Cardiologist or Primary MD anytime you have any of the following symptoms:  °1) 3 pound weight gain in 24 hours or 5 pounds in 1 week  °2) shortness of breath, with or without a dry hacking cough  °3) swelling in the hands, feet or stomach  °4) if you have to sleep on extra pillows at night in order to breathe ° °Follow cardiac low salt diet and 1.5 lit/day fluid restriction. ° °If you have diabetes °Accuchecks 4 times/day, Once in AM empty stomach and then before each meal. °Log in all results and show them to your primary doctor at your next visit. °If any glucose reading is under 80 or above 300 call your primary MD immediately. ° °If you have Seizure/Convulsions/Epilepsy: °Please do not drive, operate heavy machinery, participate in activities at heights or participate in high speed sports until you have seen by Primary MD or a Neurologist and advised to do so again. ° °If you had Gastrointestinal Bleeding: °Please ask your Primary MD to check a complete blood count within one week of discharge or at your next visit. Your endoscopic/colonoscopic biopsies that are pending at the time of discharge, will also need to followed by your Primary MD. ° °Get Medicines reviewed and adjusted. °Please take all your medications with you for your next visit with your Primary MD ° °Please request your Primary MD to go over all hospital tests and procedure/radiological results at the follow up, please ask your  Primary MD to get all Hospital records sent to his/her office. ° °If you experience worsening of your admission symptoms, develop shortness of breath, life threatening emergency, suicidal or homicidal thoughts you must seek medical attention immediately by calling 911 or calling your MD immediately  if symptoms less severe. ° °You must read complete instructions/literature along with all the possible adverse reactions/side effects for all the Medicines you take and that have been prescribed to you. Take any new Medicines after you have completely understood and accpet all the possible adverse reactions/side effects.  ° °Do not drive or operate heavy machinery when taking Pain medications.  ° °Do not take more than prescribed Pain, Sleep and Anxiety Medications ° °Special Instructions: If you have smoked or chewed Tobacco  in the last 2 yrs please stop smoking, stop any regular Alcohol  and or any Recreational drug use. ° °Wear Seat belts while driving. ° °Please note °You were cared for by a hospitalist during your hospital stay. If you have any questions about your discharge medications or the care you received while you were in the hospital after you are discharged, you can call the unit and asked to speak with the hospitalist on call if the hospitalist that took care of you is not available. Once you are discharged, your primary care physician will handle any further medical issues. Please note that NO REFILLS for any discharge medications will be authorized once you are discharged, as it is imperative that you   return to your primary care physician (or establish a relationship with a primary care physician if you do not have one) for your aftercare needs so that they can reassess your need for medications and monitor your lab values. ° °You can reach the hospitalist office at phone [phone] or fax [phone] °  °If you do not have a primary care physician, you can call [phone] for a physician  referral. ° ° °Nonspecific Chest Pain °Chest pain can be caused by many different conditions. There is always a chance that your pain could be related to something serious, such as a heart attack or a blood clot in your lungs. Chest pain can also be caused by conditions that are not life-threatening. If you have chest pain, it is very important to follow up with your health care provider. °What are the causes? °Causes of this condition include: °· Heartburn. °· Pneumonia or bronchitis. °· Anxiety or stress. °· Inflammation around your heart (pericarditis) or lung (pleuritis or pleurisy). °· A blood clot in your lung. °· A collapsed lung (pneumothorax). This can develop suddenly on its own (spontaneous pneumothorax) or from trauma to the chest. °· Shingles infection (varicella-zoster virus). °· Heart attack. °· Damage to the bones, muscles, and cartilage that make up your chest wall. This can include: °? Bruised bones due to injury. °? Strained muscles or cartilage due to frequent or repeated coughing or overwork. °? Fracture to one or more ribs. °? Sore cartilage due to inflammation (costochondritis). ° °What increases the risk? °Risk factors for this condition may include: °· Activities that increase your risk for trauma or injury to your chest. °· Respiratory infections or conditions that cause frequent coughing. °· Medical conditions or overeating that can cause heartburn. °· Heart disease or family history of heart disease. °· Conditions or health behaviors that increase your risk of developing a blood clot. °· Having had chicken pox (varicella zoster). ° °What are the signs or symptoms? °Chest pain can feel like: °· Burning or tingling on the surface of your chest or deep in your chest. °· Crushing, pressure, aching, or squeezing pain. °· Dull or sharp pain that is worse when you move, cough, or take a deep breath. °· Pain that is also felt in your back, neck, shoulder, or arm, or pain that spreads to any of  these areas. ° °Your chest pain may come and go, or it may stay constant. °How is this diagnosed? °Lab tests or other studies may be needed to find the cause of your pain. Your health care provider may have you take a test called an ECG (electrocardiogram). An ECG records your heartbeat patterns at the time the test is performed. You may also have other tests, such as: °· Transthoracic echocardiogram (TTE). In this test, sound waves are used to create a picture of the heart structures and to look at how blood flows through your heart. °· Transesophageal echocardiogram (TEE). This is a more advanced imaging test that takes images from inside your body. It allows your health care provider to see your heart in finer detail. °· Cardiac monitoring. This allows your health care provider to monitor your heart rate and rhythm in real time. °· Holter monitor. This is a portable device that records your heartbeat and can help to diagnose abnormal heartbeats. It allows your health care provider to track your heart activity for several days, if needed. °· Stress tests. These can be done through exercise or by taking medicine that makes your heart   beat more quickly. °· Blood tests. °· Other imaging tests. ° °How is this treated? °Treatment depends on what is causing your chest pain. Treatment may include: °· Medicines. These may include: °? Acid blockers for heartburn. °? Anti-inflammatory medicine. °? Pain medicine for inflammatory conditions. °? Antibiotic medicine, if an infection is present. °? Medicines to dissolve blood clots. °? Medicines to treat coronary artery disease (CAD). °· Supportive care for conditions that do not require medicines. This may include: °? Resting. °? Applying heat or cold packs to injured areas. °? Limiting activities until pain decreases. ° °Follow these instructions at home: °Medicines °· If you were prescribed an antibiotic, take it as told by your health care provider. Do not stop taking the  antibiotic even if you start to feel better. °· Take over-the-counter and prescription medicines only as told by your health care provider. °Lifestyle °· Do not use any products that contain nicotine or tobacco, such as cigarettes and e-cigarettes. If you need help quitting, ask your health care provider. °· Do not drink alcohol. °· Make lifestyle changes as directed by your health care provider. These may include: °? Getting regular exercise. Ask your health care provider to suggest some activities that are safe for you. °? Eating a heart-healthy diet. A registered dietitian can help you to learn healthy eating options. °? Maintaining a healthy weight. °? Managing diabetes, if necessary. °? Reducing stress, such as with yoga or relaxation techniques. °General instructions °· Avoid any activities that bring on chest pain. °· If heartburn is the cause for your chest pain, raise (elevate) the head of your bed about 6 inches (15 cm) by putting blocks under the legs. Sleeping with more pillows does not effectively relieve heartburn because it only changes the position of your head. °· Keep all follow-up visits as told by your health care provider. This is important. This includes any further testing if your chest pain does not go away. °Contact a health care provider if: °· Your chest pain does not go away. °· You have a rash with blisters on your chest. °· You have a fever. °· You have chills. °Get help right away if: °· Your chest pain is worse. °· You have a cough that gets worse, or you cough up blood. °· You have severe pain in your abdomen. °· You have severe weakness. °· You faint. °· You have sudden, unexplained chest discomfort. °· You have sudden, unexplained discomfort in your arms, back, neck, or jaw. °· You have shortness of breath at any time. °· You suddenly start to sweat, or your skin gets clammy. °· You feel nauseous or you vomit. °· You suddenly feel light-headed or dizzy. °· Your heart begins to beat  quickly, or it feels like it is skipping beats. °These symptoms may represent a serious problem that is an emergency. Do not wait to see if the symptoms will go away. Get medical help right away. Call your local emergency services (911 in the [place] Do not drive yourself to the hospital. °This information is not intended to replace advice given to you by your health care provider. Make sure you discuss any questions you have with your health care provider. °Document Released: 12/06/2004 Document Revised: 11/21/2015 Document Reviewed: 11/21/2015 °Elsevier Interactive Patient Education © 2017 [employer] ° °

## 2017-05-08 ENCOUNTER — Telehealth: Payer: Self-pay | Admitting: *Deleted

## 2017-05-08 ENCOUNTER — Encounter (HOSPITAL_COMMUNITY): Payer: BLUE CROSS/BLUE SHIELD

## 2017-05-08 DIAGNOSIS — E785 Hyperlipidemia, unspecified: Secondary | ICD-10-CM | POA: Diagnosis not present

## 2017-05-08 DIAGNOSIS — Z955 Presence of coronary angioplasty implant and graft: Secondary | ICD-10-CM | POA: Diagnosis not present

## 2017-05-08 DIAGNOSIS — I25118 Atherosclerotic heart disease of native coronary artery with other forms of angina pectoris: Secondary | ICD-10-CM | POA: Diagnosis not present

## 2017-05-08 DIAGNOSIS — I214 Non-ST elevation (NSTEMI) myocardial infarction: Secondary | ICD-10-CM | POA: Diagnosis not present

## 2017-05-08 DIAGNOSIS — I519 Heart disease, unspecified: Secondary | ICD-10-CM | POA: Diagnosis not present

## 2017-05-08 DIAGNOSIS — F17210 Nicotine dependence, cigarettes, uncomplicated: Secondary | ICD-10-CM | POA: Diagnosis not present

## 2017-05-08 NOTE — Telephone Encounter (Signed)
Called [employer] to follow up on authorization for Autopap.   Josem Kaufmann # [id number] exp 08/04/17 Provider: [facility]   Sleep coordinator aware and will fax orders.  Will update patient once completed today.

## 2017-05-08 NOTE — Telephone Encounter (Signed)
Called [employer] and changed provider to [facility]

## 2017-05-08 NOTE — Telephone Encounter (Signed)
Faxed order over to [facility] for 2 week CPAP auto loaner machine. Called and spoke with Lovena Le to give her the specifics of this patient's case since this is not the norm. Informed Lovena Le that I have spoken with Jeneen Rinks and he stated this set up can possibly be done this week or the beginning of next week in a group set up.

## 2017-05-08 NOTE — Telephone Encounter (Signed)
Orders faxed to [facility] via sleep coordinator.    Patient and fiance are aware and contact info for [facility] was given.

## 2017-05-09 ENCOUNTER — Telehealth: Payer: Self-pay | Admitting: Cardiology

## 2017-05-09 NOTE — Telephone Encounter (Signed)
Follow Up:     Please call her again,she cant remember what you told her about what pt could take for his nasal drip.

## 2017-05-09 NOTE — Telephone Encounter (Signed)
Returned call to patient's friend Jenny Reichmann.Advised patient can take any antihistamine with no decongestant.Ok to use saline spray.

## 2017-05-09 NOTE — Telephone Encounter (Signed)
Returned call to [place].Advised ok to take any antihistamine with no decongestant.Ok to use saline spray.

## 2017-05-09 NOTE — Telephone Encounter (Signed)
New Message:      Pt has a nasal drip.He wants to know what nasal spray can he use along with the other medicine he is on?l

## 2017-05-10 ENCOUNTER — Encounter (HOSPITAL_COMMUNITY): Payer: BLUE CROSS/BLUE SHIELD

## 2017-05-13 ENCOUNTER — Encounter (HOSPITAL_COMMUNITY): Payer: BLUE CROSS/BLUE SHIELD

## 2017-05-15 ENCOUNTER — Encounter (HOSPITAL_COMMUNITY): Payer: BLUE CROSS/BLUE SHIELD

## 2017-05-17 ENCOUNTER — Encounter (HOSPITAL_COMMUNITY)
Admission: RE | Admit: 2017-05-17 | Discharge: 2017-05-17 | Disposition: A | Discharge status: Home / Self Care | Payer: BLUE CROSS/BLUE SHIELD | Source: Ambulatory Visit | Attending: Cardiology | Admitting: Cardiology

## 2017-05-17 ENCOUNTER — Encounter (HOSPITAL_COMMUNITY): Payer: BLUE CROSS/BLUE SHIELD

## 2017-05-17 DIAGNOSIS — E785 Hyperlipidemia, unspecified: Secondary | ICD-10-CM | POA: Diagnosis not present

## 2017-05-17 DIAGNOSIS — Z85828 Personal history of other malignant neoplasm of skin: Secondary | ICD-10-CM | POA: Diagnosis not present

## 2017-05-17 DIAGNOSIS — M1990 Unspecified osteoarthritis, unspecified site: Secondary | ICD-10-CM | POA: Diagnosis not present

## 2017-05-17 DIAGNOSIS — Z7982 Long term (current) use of aspirin: Secondary | ICD-10-CM | POA: Insufficient documentation

## 2017-05-17 DIAGNOSIS — Z955 Presence of coronary angioplasty implant and graft: Secondary | ICD-10-CM | POA: Diagnosis not present

## 2017-05-17 DIAGNOSIS — Z8619 Personal history of other infectious and parasitic diseases: Secondary | ICD-10-CM | POA: Diagnosis not present

## 2017-05-17 DIAGNOSIS — Z79899 Other long term (current) drug therapy: Secondary | ICD-10-CM | POA: Insufficient documentation

## 2017-05-17 DIAGNOSIS — K219 Gastro-esophageal reflux disease without esophagitis: Secondary | ICD-10-CM | POA: Diagnosis not present

## 2017-05-17 DIAGNOSIS — F17290 Nicotine dependence, other tobacco product, uncomplicated: Secondary | ICD-10-CM | POA: Diagnosis not present

## 2017-05-17 DIAGNOSIS — I214 Non-ST elevation (NSTEMI) myocardial infarction: Secondary | ICD-10-CM | POA: Diagnosis not present

## 2017-05-17 DIAGNOSIS — Z7902 Long term (current) use of antithrombotics/antiplatelets: Secondary | ICD-10-CM | POA: Diagnosis not present

## 2017-05-17 NOTE — Progress Notes (Signed)
James [name] 57 y.o. male DOB: December 20, 1960 MRN: [id number]      Nutrition Note  1. NSTEMI (non-ST elevated myocardial infarction) ([place])   2. Status post coronary artery stent placement    Nutrition Note Spoke with pt. Nutrition plan and survey reviewed with pt. Pt is following a heart healthy diet. Pt is planning on working toward a more plant based diet. Pt has joined a program in [place], [place] that follows the [employer] and focuses on exercise, stress management, and diet. Pt expressed understanding of the information reviewed. Pt aware of nutrition education classes offered.  Nutrition Diagnosis Food-and nutrition-related knowledge deficit related to lack of exposure to information as related to diagnosis of: ? CVD   Nutrition Intervention ? Pt's individual nutrition plan reviewed with pt. ? Benefits of adopting Heart Healthy diet discussed when [employer] reviewed.    Nutrition Goal(s):  ? Pt did not want to set a nutrition goal at this time.  Plan:  Pt to attend nutrition classes ? Nutrition I- met ? Nutrition II - met 04/30/17 ? Portion Distortion  Will provide client-centered nutrition education as part of interdisciplinary care.   Monitor and evaluate progress toward nutrition goal with team.  Derek Mound, M.Ed, RD, LDN, CDE 05/17/2017 11:25 AM

## 2017-05-20 ENCOUNTER — Encounter (HOSPITAL_COMMUNITY): Payer: BLUE CROSS/BLUE SHIELD

## 2017-05-20 ENCOUNTER — Encounter (HOSPITAL_COMMUNITY)
Admission: RE | Admit: 2017-05-20 | Discharge: 2017-05-20 | Disposition: A | Discharge status: Home / Self Care | Payer: BLUE CROSS/BLUE SHIELD | Source: Ambulatory Visit | Attending: Cardiology | Admitting: Cardiology

## 2017-05-20 VITALS — Wt 194.2 lb

## 2017-05-20 DIAGNOSIS — Z955 Presence of coronary angioplasty implant and graft: Secondary | ICD-10-CM | POA: Diagnosis not present

## 2017-05-20 DIAGNOSIS — I214 Non-ST elevation (NSTEMI) myocardial infarction: Secondary | ICD-10-CM | POA: Diagnosis not present

## 2017-05-20 DIAGNOSIS — E785 Hyperlipidemia, unspecified: Secondary | ICD-10-CM | POA: Diagnosis not present

## 2017-05-20 DIAGNOSIS — Z85828 Personal history of other malignant neoplasm of skin: Secondary | ICD-10-CM | POA: Diagnosis not present

## 2017-05-20 DIAGNOSIS — Z7982 Long term (current) use of aspirin: Secondary | ICD-10-CM | POA: Diagnosis not present

## 2017-05-20 DIAGNOSIS — F17290 Nicotine dependence, other tobacco product, uncomplicated: Secondary | ICD-10-CM | POA: Diagnosis not present

## 2017-05-20 DIAGNOSIS — Z7902 Long term (current) use of antithrombotics/antiplatelets: Secondary | ICD-10-CM | POA: Diagnosis not present

## 2017-05-20 DIAGNOSIS — M1990 Unspecified osteoarthritis, unspecified site: Secondary | ICD-10-CM | POA: Diagnosis not present

## 2017-05-20 DIAGNOSIS — Z79899 Other long term (current) drug therapy: Secondary | ICD-10-CM | POA: Diagnosis not present

## 2017-05-20 DIAGNOSIS — Z8619 Personal history of other infectious and parasitic diseases: Secondary | ICD-10-CM | POA: Diagnosis not present

## 2017-05-20 DIAGNOSIS — K219 Gastro-esophageal reflux disease without esophagitis: Secondary | ICD-10-CM | POA: Diagnosis not present

## 2017-05-20 NOTE — Progress Notes (Signed)
Discharge Progress Report  Patient Details  Name: James [name] MRN: [id number] Date of Birth: 1960/06/26 Referring Provider:     CARDIAC REHAB PHASE II ORIENTATION from 04/25/2017 in [place]  Referring Provider  Martinique, Peter MD        Number of Visits: 6  Reason for Discharge:  Early Exit:  Back to work, moved back to [employer] [place]  Smoking History:  Social History   Tobacco Use  Smoking Status Former Smoker  . Types: Cigars  . Last attempt to quit: 03/29/2017  . Years since quitting: 0.2  Smokeless Tobacco Never Used  Tobacco Comment   cigar    Diagnosis:  NSTEMI (non-ST elevated myocardial infarction) ([place])  Status post coronary artery stent placement  ADL UCSD:   Initial Exercise Prescription: Initial Exercise Prescription - 04/25/17 1100      Date of Initial Exercise RX and Referring Provider   Date  04/25/17    Referring Provider  Martinique, Peter MD       Treadmill   MPH  3    Grade  1    Minutes  10    METs  3.71      Bike   Level  1.3    Minutes  10    METs  3.73      Elliptical   Level  1    Speed  1    Minutes  10    METs  3.7      Prescription Details   Frequency (times per week)  3x    Duration  Progress to 45 minutes of aerobic exercise without signs/symptoms of physical distress      Intensity   THRR 40-80% of Max Heartrate  65-130    Ratings of Perceived Exertion  11-15    Perceived Dyspnea  0-4      Progression   Progression  Continue progressive overload as per policy without signs/symptoms or physical distress.      Resistance Training   Training Prescription  Yes    Weight  6lbs    Reps  10-15       Discharge Exercise Prescription (Final Exercise Prescription Changes): Exercise Prescription Changes - 05/20/17 0957      Response to Exercise   Blood Pressure (Admit)  --    Blood Pressure (Exercise)  130/74    Blood Pressure (Exit)  108/70    Heart Rate (Admit)  77 bpm    Heart  Rate (Exercise)  106 bpm    Heart Rate (Exit)  77 bpm    Rating of Perceived Exertion (Exercise)  11    Perceived Dyspnea (Exercise)  --    Duration  Continue with 30 min of aerobic exercise without signs/symptoms of physical distress.    Intensity  THRR unchanged      Progression   Progression  Continue to progress workloads to maintain intensity without signs/symptoms of physical distress.    Average METs  3.8      Resistance Training   Training Prescription  Yes    Weight  6lbs    Reps  10-15    Time  10 Minutes      Interval Training   Interval Training  No      Treadmill   MPH  3    Grade  1    Minutes  10    METs  3.71      Bike   Level  1.3  Minutes  10    METs  3.78      Adaptive Motion Trainer   Resistance  1    Grade  2    Minutes  10    METs  4      Home Exercise Plan   Plans to continue exercise at  [facility] (comment)    Frequency  Add 4 additional days to program exercise sessions.    Initial Home Exercises Provided  05/03/17       Functional Capacity: 6 Minute Walk    Row Name 04/25/17 1149         6 Minute Walk   Phase  Initial     Distance  1670 feet     Walk Time  6 minutes     # of Rest Breaks  0     MPH  3.16     METS  3.96     RPE  8     Perceived Dyspnea   0     VO2 Peak  13.89     Symptoms  No     Resting HR  87 bpm     Resting BP  104/68     Resting Oxygen Saturation   96 %     Exercise Oxygen Saturation  during 6 min walk  100 %     Max Ex. HR  100 bpm     Max Ex. BP  120/72     2 Minute Post BP  98/62        Psychological, QOL, Others - Outcomes: PHQ 2/9: Depression screen [facility] 2/9 05/20/2017 05/01/2017  Decreased Interest 0 0  Down, Depressed, Hopeless 0 0  PHQ - 2 Score 0 0    Quality of Life: Quality of Life - 04/25/17 1151      Quality of Life Scores   Health/Function Pre  18.88 %    Socioeconomic Pre  29.14 %    Psych/Spiritual Pre  27.43 %    Family Pre  30 %    GLOBAL Pre  24.73 %        Personal Goals: Goals established at orientation with interventions provided to work toward goal. Personal Goals and Risk Factors at Admission - 04/25/17 1139      Core Components/Risk Factors/Patient Goals on Admission    Weight Management  Yes    Intervention  Weight Management: Develop a combined nutrition and exercise program designed to reach desired caloric intake, while maintaining appropriate intake of nutrient and fiber, sodium and fats, and appropriate energy expenditure required for the weight goal.;Weight Management: Provide education and appropriate resources to help participant work on and attain dietary goals.;Weight Management/Obesity: Establish reasonable short term and long term weight goals.    Admit Weight  199 lb 4.7 oz (90.4 kg)    Goal Weight: Short Term  195 lb (88.5 kg)    Goal Weight: Long Term  190 lb (86.2 kg)    Expected Outcomes  Short Term: Continue to assess and modify interventions until short term weight is achieved;Long Term: Adherence to nutrition and physical activity/exercise program aimed toward attainment of established weight goal;Weight Maintenance: Understanding of the daily nutrition guidelines, which includes 25-35% calories from fat, 7% or less cal from saturated fats, less than 236m cholesterol, less than 1.5gm of sodium, & 5 or more servings of fruits and vegetables daily;Understanding recommendations for meals to include 15-35% energy as protein, 25-35% energy from fat, 35-60% energy from carbohydrates, less than 2051m  of dietary cholesterol, 20-35 gm of total fiber daily;Understanding of distribution of calorie intake throughout the day with the consumption of 4-5 meals/snacks    Hypertension  Yes    Intervention  Provide education on lifestyle modifcations including regular physical activity/exercise, weight management, moderate sodium restriction and increased consumption of fresh fruit, vegetables, and low fat dairy, alcohol moderation, and  smoking cessation.;Monitor prescription use compliance.    Expected Outcomes  Short Term: Continued assessment and intervention until BP is < 140/53m HG in hypertensive participants. < 130/854mHG in hypertensive participants with diabetes, heart failure or chronic kidney disease.;Long Term: Maintenance of blood pressure at goal levels.    Lipids  Yes    Intervention  Provide education and support for participant on nutrition & aerobic/resistive exercise along with prescribed medications to achieve LDL <707mHDL >72m53m  Expected Outcomes  Short Term: Participant states understanding of desired cholesterol values and is compliant with medications prescribed. Participant is following exercise prescription and nutrition guidelines.;Long Term: Cholesterol controlled with medications as prescribed, with individualized exercise RX and with personalized nutrition plan. Value goals: LDL < 70mg66mL > 40 mg.    Stress  Yes    Intervention  Offer individual and/or small group education and counseling on adjustment to heart disease, stress management and health-related lifestyle change. Teach and support self-help strategies.;Refer participants experiencing significant psychosocial distress to appropriate mental health specialists for further evaluation and treatment. When possible, include family members and significant others in education/counseling sessions.    Expected Outcomes  Short Term: Participant demonstrates changes in health-related behavior, relaxation and other stress management skills, ability to obtain effective social support, and compliance with psychotropic medications if prescribed.;Long Term: Emotional wellbeing is indicated by absence of clinically significant psychosocial distress or social isolation.        Personal Goals Discharge:   Exercise Goals and Review: Exercise Goals    Row Name 04/25/17 1150             Exercise Goals   Increase Physical Activity  Yes        Intervention  Provide advice, education, support and counseling about physical activity/exercise needs.;Develop an individualized exercise prescription for aerobic and resistive training based on initial evaluation findings, risk stratification, comorbidities and participant's personal goals.       Expected Outcomes  Short Term: Attend rehab on a regular basis to increase amount of physical activity.;Long Term: Add in home exercise to make exercise part of routine and to increase amount of physical activity.;Long Term: Exercising regularly at least 3-5 days a week.       Increase Strength and Stamina  Yes       Intervention  Provide advice, education, support and counseling about physical activity/exercise needs.;Develop an individualized exercise prescription for aerobic and resistive training based on initial evaluation findings, risk stratification, comorbidities and participant's personal goals.       Expected Outcomes  Short Term: Increase workloads from initial exercise prescription for resistance, speed, and METs.;Short Term: Perform resistance training exercises routinely during rehab and add in resistance training at home;Long Term: Improve cardiorespiratory fitness, muscular endurance and strength as measured by increased METs and functional capacity (6MWT)       Able to understand and use rate of perceived exertion (RPE) scale  Yes       Intervention  Provide education and explanation on how to use RPE scale       Expected Outcomes  Short Term: Able to use RPE daily  in rehab to express subjective intensity level;Long Term:  Able to use RPE to guide intensity level when exercising independently       Knowledge and understanding of Target Heart Rate Range (THRR)  Yes       Intervention  Provide education and explanation of THRR including how the numbers were predicted and where they are located for reference       Expected Outcomes  Short Term: Able to state/look up THRR;Short Term: Able to use  daily as guideline for intensity in rehab;Long Term: Able to use THRR to govern intensity when exercising independently       Able to check pulse independently  Yes       Intervention  Provide education and demonstration on how to check pulse in carotid and radial arteries.;Review the importance of being able to check your own pulse for safety during independent exercise       Expected Outcomes  Short Term: Able to explain why pulse checking is important during independent exercise;Long Term: Able to check pulse independently and accurately       Understanding of Exercise Prescription  Yes       Intervention  Provide education, explanation, and written materials on patient's individual exercise prescription       Expected Outcomes  Short Term: Able to explain program exercise prescription;Long Term: Able to explain home exercise prescription to exercise independently          Nutrition & Weight - Outcomes: Pre Biometrics - 04/25/17 1150      Pre Biometrics   Height  _0  (1.727 m)    Weight  199 lb 4.7 oz (90.4 kg)    Waist Circumference  39 inches    Hip Circumference  40.5 inches    Waist to Hip Ratio  0.96 %    BMI (Calculated)  30.31    Triceps Skinfold  9 mm    % Body Fat  25.6 %    Grip Strength  39 kg    Flexibility  13.5 in    Single Leg Stand  12.45 seconds      Post Biometrics - 05/20/17 0957       Post  Biometrics   Weight  194 lb 3.6 oz (88.1 kg)    BMI (Calculated)  29.54       Nutrition: Nutrition Therapy & Goals - 04/26/17 1011      Nutrition Therapy   Diet  Heart Healthy      Intervention Plan   Intervention  Prescribe, educate and counsel regarding individualized specific dietary modifications aiming towards targeted core components such as weight, hypertension, lipid management, diabetes, heart failure and other comorbidities.    Expected Outcomes  Short Term Goal: Understand basic principles of dietary content, such as calories, fat, sodium, cholesterol  and nutrients.;Long Term Goal: Adherence to prescribed nutrition plan.       Nutrition Discharge: Nutrition Assessments - 04/26/17 1011      MEDFICTS Scores   Pre Score  27       Education Questionnaire Score: Knowledge Questionnaire Score - 04/25/17 1151      Knowledge Questionnaire Score   Pre Score  21/24       Goals reviewed with patient; copy given to patient. Pt graduated from cardiac rehab program today with completion of 36 exercise sessions in Phase II. Pt maintained good attendance and progressed nicely during his participation in rehab as evidenced by increased MET level.   Medication list reconciled. Repeat  PHQ score- 0 .  Pt has made significant lifestyle changes and should be commended for his success. Pt feels he has achieved his goals during cardiac rehab.   Pt plans to continue exercise at [employer] in [place]. Sol completed the program early so that he could move back to the [place].Barnet Pall, RN,BSN 06/13/2017 5:03 PM

## 2017-05-21 MED ORDER — FENTANYL CITRATE (PF) 250 MCG/5ML IJ SOLN
INTRAMUSCULAR | Status: AC
  Filled 2017-05-21: qty 5

## 2017-05-22 ENCOUNTER — Encounter (HOSPITAL_COMMUNITY): Payer: BLUE CROSS/BLUE SHIELD

## 2017-05-23 ENCOUNTER — Ambulatory Visit (HOSPITAL_COMMUNITY): Payer: BLUE CROSS/BLUE SHIELD

## 2017-05-24 ENCOUNTER — Encounter (HOSPITAL_COMMUNITY): Payer: BLUE CROSS/BLUE SHIELD

## 2017-05-27 ENCOUNTER — Encounter (HOSPITAL_COMMUNITY): Payer: BLUE CROSS/BLUE SHIELD

## 2017-05-27 DIAGNOSIS — H00021 Hordeolum internum right upper eyelid: Secondary | ICD-10-CM | POA: Diagnosis not present

## 2017-05-29 ENCOUNTER — Telehealth: Payer: Self-pay | Admitting: Cardiology

## 2017-05-29 ENCOUNTER — Ambulatory Visit (HOSPITAL_COMMUNITY): Payer: BLUE CROSS/BLUE SHIELD

## 2017-05-29 ENCOUNTER — Encounter (HOSPITAL_COMMUNITY): Payer: BLUE CROSS/BLUE SHIELD

## 2017-05-29 DIAGNOSIS — J342 Deviated nasal septum: Secondary | ICD-10-CM | POA: Diagnosis not present

## 2017-05-29 DIAGNOSIS — Z6828 Body mass index (BMI) 28.0-28.9, adult: Secondary | ICD-10-CM | POA: Diagnosis not present

## 2017-05-29 DIAGNOSIS — J343 Hypertrophy of nasal turbinates: Secondary | ICD-10-CM | POA: Diagnosis not present

## 2017-05-29 DIAGNOSIS — G4733 Obstructive sleep apnea (adult) (pediatric): Secondary | ICD-10-CM | POA: Diagnosis not present

## 2017-05-29 NOTE — Telephone Encounter (Signed)
Returned call to patient's wife Raquel's advice given.

## 2017-05-29 NOTE — Telephone Encounter (Signed)
Returned call to patient's wife.She wanted to make sure ok for husband to take Fluticasone.Advised I will send message to our pharmacist for advice.

## 2017-05-29 NOTE — Telephone Encounter (Signed)
Yes; please use fluticasone as prescribed.   No expected drug-drug interaction noted with current therapy.

## 2017-05-29 NOTE — Telephone Encounter (Signed)
New message   Pt c/o medication issue:  1. Name of Medication: nasal spray steroid - fluticasone    2. How are you currently taking this medication (dosage and times per day)? New spray from ENT  3. Are you having a reaction (difficulty breathing--STAT)? NO   4. What is your medication issue? Discuss with nurse

## 2017-05-31 ENCOUNTER — Encounter (HOSPITAL_COMMUNITY): Payer: BLUE CROSS/BLUE SHIELD

## 2017-05-31 ENCOUNTER — Ambulatory Visit (HOSPITAL_COMMUNITY): Payer: BLUE CROSS/BLUE SHIELD

## 2017-06-03 ENCOUNTER — Ambulatory Visit (HOSPITAL_COMMUNITY): Payer: BLUE CROSS/BLUE SHIELD

## 2017-06-03 ENCOUNTER — Encounter (HOSPITAL_COMMUNITY): Payer: BLUE CROSS/BLUE SHIELD

## 2017-06-03 DIAGNOSIS — I2510 Atherosclerotic heart disease of native coronary artery without angina pectoris: Secondary | ICD-10-CM | POA: Diagnosis not present

## 2017-06-04 DIAGNOSIS — I252 Old myocardial infarction: Secondary | ICD-10-CM | POA: Diagnosis not present

## 2017-06-04 DIAGNOSIS — Z955 Presence of coronary angioplasty implant and graft: Secondary | ICD-10-CM | POA: Diagnosis not present

## 2017-06-05 ENCOUNTER — Encounter (HOSPITAL_COMMUNITY): Payer: BLUE CROSS/BLUE SHIELD

## 2017-06-05 ENCOUNTER — Ambulatory Visit (HOSPITAL_COMMUNITY): Payer: BLUE CROSS/BLUE SHIELD

## 2017-06-06 DIAGNOSIS — Z955 Presence of coronary angioplasty implant and graft: Secondary | ICD-10-CM | POA: Diagnosis not present

## 2017-06-06 DIAGNOSIS — I252 Old myocardial infarction: Secondary | ICD-10-CM | POA: Diagnosis not present

## 2017-06-07 ENCOUNTER — Ambulatory Visit (HOSPITAL_COMMUNITY): Payer: BLUE CROSS/BLUE SHIELD

## 2017-06-07 ENCOUNTER — Telehealth: Payer: Self-pay | Admitting: *Deleted

## 2017-06-07 ENCOUNTER — Encounter (HOSPITAL_COMMUNITY): Payer: BLUE CROSS/BLUE SHIELD

## 2017-06-07 NOTE — Telephone Encounter (Signed)
After calling the patient twice yesterday, and again this morning the patient returned a call. He was notified per Dr Claiborne Billings he needs to use his CPAP machine every night 100% of the night. Since the patient states that he cannot sleep using the full face mask Dr Claiborne Billings will be switching him to a Airfit P-10 nasal mask in order to help him to reach compliance. He states that he already has a nasal mask. He also wants to know if the mask he has versus the one Dr Claiborne Billings wants to order will make a difference in his nose due to him having a deviated septum. I told him we have no way of knowing because we don't know what type of nasal mask he has. Patient was informed that he has to reach compliance within 90 days from the date of set up in order for the insurance to continue paying for it. The patient responded saying that he is not going to let the insurance company push him around and dictate to him how to use the machine. He agrees to attempt to use the CPAP machine for 2 weeks using his current nasal mask, but wants me to ask Dr Claiborne Billings is there a difference for instance in the depth etc of the way the different nasal masks fits in the nose. He also states that if he can use the machine he will and if he can't he will take it back.  Message will be routed to Dr Claiborne Billings for review.

## 2017-06-10 ENCOUNTER — Encounter (HOSPITAL_COMMUNITY): Payer: BLUE CROSS/BLUE SHIELD

## 2017-06-10 ENCOUNTER — Ambulatory Visit (HOSPITAL_COMMUNITY): Payer: BLUE CROSS/BLUE SHIELD

## 2017-06-11 DIAGNOSIS — I252 Old myocardial infarction: Secondary | ICD-10-CM | POA: Diagnosis not present

## 2017-06-11 DIAGNOSIS — Z955 Presence of coronary angioplasty implant and graft: Secondary | ICD-10-CM | POA: Diagnosis not present

## 2017-06-12 ENCOUNTER — Ambulatory Visit (HOSPITAL_COMMUNITY): Payer: BLUE CROSS/BLUE SHIELD

## 2017-06-12 ENCOUNTER — Encounter (HOSPITAL_COMMUNITY): Payer: BLUE CROSS/BLUE SHIELD

## 2017-06-12 NOTE — Telephone Encounter (Signed)
Recommend a Respironics Dreamwear or a ResMed N30i mask not nasal pillow or nasal mask.

## 2017-06-13 DIAGNOSIS — I252 Old myocardial infarction: Secondary | ICD-10-CM | POA: Diagnosis not present

## 2017-06-13 DIAGNOSIS — Z955 Presence of coronary angioplasty implant and graft: Secondary | ICD-10-CM | POA: Diagnosis not present

## 2017-06-13 NOTE — Telephone Encounter (Signed)
Patient notified of Dr Evette Georges recommendations for ah mask. He agrees and requests for mask to be ordered and have them to call spouse, James [name] to pick up as he is currently in [place]. Order for mask sent to [facility].

## 2017-06-14 ENCOUNTER — Encounter (HOSPITAL_COMMUNITY): Payer: BLUE CROSS/BLUE SHIELD

## 2017-06-14 ENCOUNTER — Ambulatory Visit (HOSPITAL_COMMUNITY): Payer: BLUE CROSS/BLUE SHIELD

## 2017-06-17 ENCOUNTER — Encounter (HOSPITAL_COMMUNITY): Payer: BLUE CROSS/BLUE SHIELD

## 2017-06-17 ENCOUNTER — Ambulatory Visit (HOSPITAL_COMMUNITY): Payer: BLUE CROSS/BLUE SHIELD

## 2017-06-18 DIAGNOSIS — I252 Old myocardial infarction: Secondary | ICD-10-CM | POA: Diagnosis not present

## 2017-06-18 DIAGNOSIS — Z955 Presence of coronary angioplasty implant and graft: Secondary | ICD-10-CM | POA: Diagnosis not present

## 2017-06-19 ENCOUNTER — Ambulatory Visit (HOSPITAL_COMMUNITY): Payer: BLUE CROSS/BLUE SHIELD

## 2017-06-19 ENCOUNTER — Telehealth: Payer: Self-pay | Admitting: *Deleted

## 2017-06-19 ENCOUNTER — Encounter (HOSPITAL_COMMUNITY): Payer: BLUE CROSS/BLUE SHIELD

## 2017-06-19 NOTE — Telephone Encounter (Signed)
Patient  Called to say that he started using his CPAP machine again last night and this morning although he felt rested he noticed he had a "few PVC's /PAC's as well as being lightheaded today and was wondering are these symptoms coming from getting "too much oxygen to the brain." from the machine. He did admit to after swearing off of caffeine, he did however have a glass of tea yesterday. I told the patient that I don't feel that his symptoms are due to his CPAP therapy. If anything his treatment should help the symptoms. Recommended to him to continue using his CPAP machine. Try not to use caffeine. If symptoms persist, he is to call back to advise. Message will be routed to MD for review and further recommendations if needed.

## 2017-06-20 DIAGNOSIS — Z955 Presence of coronary angioplasty implant and graft: Secondary | ICD-10-CM | POA: Diagnosis not present

## 2017-06-20 DIAGNOSIS — I252 Old myocardial infarction: Secondary | ICD-10-CM | POA: Diagnosis not present

## 2017-06-21 ENCOUNTER — Ambulatory Visit (HOSPITAL_COMMUNITY): Payer: BLUE CROSS/BLUE SHIELD

## 2017-06-21 ENCOUNTER — Encounter (HOSPITAL_COMMUNITY): Payer: BLUE CROSS/BLUE SHIELD

## 2017-06-21 NOTE — Telephone Encounter (Signed)
Symptoms are not related to CPAP use.  Would avoid all caffeine.  If PVCs continue may benefit from titration of his beta-blocker therapy but will defer this to his primary cardiologist, [name]

## 2017-06-24 ENCOUNTER — Encounter (HOSPITAL_COMMUNITY): Payer: BLUE CROSS/BLUE SHIELD

## 2017-06-24 ENCOUNTER — Telehealth: Payer: Self-pay | Admitting: Cardiology

## 2017-06-24 ENCOUNTER — Ambulatory Visit (HOSPITAL_COMMUNITY): Payer: BLUE CROSS/BLUE SHIELD

## 2017-06-24 NOTE — Telephone Encounter (Signed)
Patient called in asking whether or not he can stop taking Imdur. He stated that he currently spends some time in [facility]. He has seen a cardiologist there once and the cardiologist has discontinued his Lisinopril. He is having lightheadedness and headaches. The cardiologist in [place] has suggested discontinuing the Imdur as well. He stated that his blood pressures typically stay around 112/70. He would like to have Dr. Doug Sou opinion about whether he should stop the Imdur. He stated that he could wait until his appointment on 4/29 with [name] if needed.

## 2017-06-24 NOTE — Telephone Encounter (Signed)
Fu call °Patient returning your call °

## 2017-06-24 NOTE — Telephone Encounter (Signed)
Return called to patient. Unable to leave message per pt voicemail request.

## 2017-06-24 NOTE — Telephone Encounter (Signed)
New Message:   Pt says he have some questions he wants you to relay to Dr Marrion Coy his medicine.

## 2017-06-24 NOTE — Telephone Encounter (Signed)
Returned the call to the patient. Per his voicemail, he does not check his messages and stated to not leave a message.

## 2017-06-25 DIAGNOSIS — Z955 Presence of coronary angioplasty implant and graft: Secondary | ICD-10-CM | POA: Diagnosis not present

## 2017-06-25 DIAGNOSIS — I252 Old myocardial infarction: Secondary | ICD-10-CM | POA: Diagnosis not present

## 2017-06-25 NOTE — Telephone Encounter (Signed)
It is ok to stop the Imdur and we can see if his headaches improve.  Laura-Lee Villegas Martinique MD, [facility]

## 2017-06-25 NOTE — Telephone Encounter (Signed)
Returned call to patient Dr.Jordan's recommendations given.Advised to keep appointment with Dr.Jordan 07/08/17 at 3:20 pm.

## 2017-06-26 ENCOUNTER — Encounter (HOSPITAL_COMMUNITY): Payer: BLUE CROSS/BLUE SHIELD

## 2017-06-26 ENCOUNTER — Ambulatory Visit (HOSPITAL_COMMUNITY): Payer: BLUE CROSS/BLUE SHIELD

## 2017-06-26 ENCOUNTER — Telehealth: Payer: Self-pay | Admitting: *Deleted

## 2017-06-26 NOTE — Telephone Encounter (Signed)
Received a call from patient requesting that we make arrangements for him to see Drs Martinique and Claiborne Billings the same week, if possible within a day or two of one another. He has appointment to see Dr Martinique on 4/29 and will be driving in from [facility] for the appointment. I explained to him I will see what I can arrange. The Dr's may not be here in the office together during the same week. Patient's appointment moved from 07/15/17 to 4/30 sleep clinic day. He was informed that he is being worked into Dr Colgate schedule and may have to wait. Patient voiced verbal understanding of this.

## 2017-06-27 DIAGNOSIS — I252 Old myocardial infarction: Secondary | ICD-10-CM | POA: Diagnosis not present

## 2017-06-27 DIAGNOSIS — Z955 Presence of coronary angioplasty implant and graft: Secondary | ICD-10-CM | POA: Diagnosis not present

## 2017-06-28 ENCOUNTER — Encounter (HOSPITAL_COMMUNITY): Payer: BLUE CROSS/BLUE SHIELD

## 2017-06-28 ENCOUNTER — Ambulatory Visit (HOSPITAL_COMMUNITY): Payer: BLUE CROSS/BLUE SHIELD

## 2017-07-01 ENCOUNTER — Encounter (HOSPITAL_COMMUNITY): Payer: BLUE CROSS/BLUE SHIELD

## 2017-07-01 ENCOUNTER — Ambulatory Visit (HOSPITAL_COMMUNITY): Payer: BLUE CROSS/BLUE SHIELD

## 2017-07-02 DIAGNOSIS — Z955 Presence of coronary angioplasty implant and graft: Secondary | ICD-10-CM | POA: Diagnosis not present

## 2017-07-02 DIAGNOSIS — I252 Old myocardial infarction: Secondary | ICD-10-CM | POA: Diagnosis not present

## 2017-07-03 ENCOUNTER — Ambulatory Visit (HOSPITAL_COMMUNITY): Payer: BLUE CROSS/BLUE SHIELD

## 2017-07-03 ENCOUNTER — Encounter (HOSPITAL_COMMUNITY): Payer: BLUE CROSS/BLUE SHIELD

## 2017-07-04 DIAGNOSIS — I252 Old myocardial infarction: Secondary | ICD-10-CM | POA: Diagnosis not present

## 2017-07-04 DIAGNOSIS — Z955 Presence of coronary angioplasty implant and graft: Secondary | ICD-10-CM | POA: Diagnosis not present

## 2017-07-05 ENCOUNTER — Encounter (HOSPITAL_COMMUNITY): Payer: BLUE CROSS/BLUE SHIELD

## 2017-07-05 ENCOUNTER — Ambulatory Visit (HOSPITAL_COMMUNITY): Payer: BLUE CROSS/BLUE SHIELD

## 2017-07-05 DIAGNOSIS — R0600 Dyspnea, unspecified: Secondary | ICD-10-CM | POA: Diagnosis not present

## 2017-07-07 NOTE — Progress Notes (Signed)
Cardiology Office Note    Date:  07/09/2017   ID:  James [name], DOB 1961/03/05, MRN [id number]  PCP:  [name], MD  Cardiologist:  [name]   Chief Complaint  Patient presents with  . Follow-up  . Coronary Artery Disease    History of Present Illness:  James [name] is a 57 y.o. male with PMH of basal cell skin CA, polycythemia and hepatitis C presented with NSTEMI on 03/29/2017.  Troponin on a peak at 45.54.  Hemoglobin A1c 5.5. Total cholesterol 155, HDL 26, LDL 91, triglyceride 191.  Urgent cardiac catheterization on the same day showed 100% proximal LAD lesion treated with thrombectomy and 3.5 x 16 mm Synergy DES, EF 45%.  Postprocedure, he was on IV Aggrastat for 18 hours.  He was also placed on aspirin, Brilinta and high-dose statin.    He was seen by Almyra Deforest PA-C on 04/09/2017 for post-cath follow-up.  He denied any chest pain at the time.  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  He was having a lot of anxiety issues.  He was also referred  to [name] for management of obstructive sleep apnea.  He could not tolerate full facial mask in the past, however I think he can tolerate nasal device instead.  The  patient then presented to the hospital with chest pain on the following morning after the office visit.  Troponin was initially elevated and peaked at 2.7.  He underwent cardiac catheterization, however this showed his LAD stent was widely patent, there is mild jailing of the diagonal artery.  Retrospective, it was unclear if patient had transient partial stent thrombosis or coronary spasm at the end of the stent.  Echocardiogram was repeated which showed EF 55-60%, severe hypokinesis of the akinesis of mid anteroseptal and apical anterior wall, moderate LVH.  Ejection fraction has improved from the previous 40-45% on echo.  Low-dose nitrates were added,  patient was eventually discharged on 04/11/2017.  He was admitted in February again with atypical chest and epigastric pain. He ruled out for MI. Seen by our service and pain was felt to be noncardiac. Recommended outpatient psychiatry evaluation for severe anxiety.   He has since been seen by a Cardiologist in [place]- Dr. [name]. Lisinopril discontinued due to dizziness and HA. Later nitrates also reduced. He underwent a CPX on 07/05/17 and although results are not available in [facility] the patient reports he did very well.   He reports he is participating in a Rehab program there and following the [facility] program with diet and meditation. He still notes occasional tightness in his chest if he feels stressed. There is a focal area of discomfort in the left precordium that has been present since his MI that is not related to exertion. Some numbness in his left arm. He has lost 12 lbs. He is sleeping much better.     Past Medical History:  Diagnosis Date  . Acute systolic congestive heart failure ([place])   . Anemia    slight anemia with hepatitis tx  . Arthritis    oa  . Cancer ([place]) 4 months ago   basal cell skin cancer removed from chest area  . GERD (gastroesophageal reflux disease)   . Hepatitis C 04/29/2012   had savalta treatment 2 years ago, now cured  . Hyperlipidemia   .  NSTEMI (non-ST elevated myocardial infarction) ([place]) 03/29/2017   1/19 PCI/DESx1 to mLAD, EF 45%  . Palpitations    at times     Past Surgical History:  Procedure Laterality Date  . ARTHROSCOPIC REPAIR ACL Right 15 years ago  . CORONARY STENT INTERVENTION N/A 03/29/2017   Procedure: CORONARY STENT INTERVENTION;  Surgeon: Martinique, Twisha Vanpelt M, MD;  Location: [facility];  Service: Cardiovascular;  Laterality: N/A;  . CORONARY THROMBECTOMY N/A 03/29/2017   Procedure: Coronary Thrombectomy;  Surgeon: Martinique, Amoreena Neubert M, MD;  Location: [facility];  Service: Cardiovascular;  Laterality: N/A;   . LEFT HEART CATH AND CORONARY ANGIOGRAPHY N/A 03/29/2017   Procedure: LEFT HEART CATH AND CORONARY ANGIOGRAPHY;  Surgeon: Martinique, Donavan Kerlin M, MD;  Location: [facility];  Service: Cardiovascular;  Laterality: N/A;  . LEFT HEART CATH AND CORONARY ANGIOGRAPHY N/A 04/10/2017   Procedure: LEFT HEART CATH AND CORONARY ANGIOGRAPHY;  Surgeon: Martinique, Burman Bruington M, MD;  Location: [facility];  Service: Cardiovascular;  Laterality: N/A;  . TOTAL HIP ARTHROPLASTY Right 01/11/2015   Procedure: RIGHT TOTAL HIP ARTHROPLASTY ANTERIOR APPROACH;  Surgeon: Paralee Cancel, MD;  Location: [facility];  Service: Orthopedics;  Laterality: Right;    Current Medications: Outpatient Medications Prior to Visit  Medication Sig Dispense Refill  . ALPRAZolam (XANAX) 0.25 MG tablet Take 1 tablet (0.25 mg total) by mouth 2 (two) times daily as needed for anxiety. 20 tablet 0  . aspirin 81 MG tablet Take 1 tablet (81 mg total) by mouth daily. 90 tablet 3  . atorvastatin (LIPITOR) 80 MG tablet Take 1 tablet (80 mg total) by mouth daily at 6 PM. 30 tablet 6  . colchicine 0.6 MG tablet Take 1 tablet (0.6 mg total) by mouth 2 (two) times daily as needed (gout flare up). 60 tablet 6  . isosorbide mononitrate (IMDUR) 30 MG 24 hr tablet Take 0.5 tablets by mouth daily.    . metoprolol succinate (TOPROL-XL) 25 MG 24 hr tablet Take 1 tablet (25 mg total) by mouth daily. 30 tablet 6  . nitroGLYCERIN (NITROSTAT) 0.4 MG SL tablet Place 1 tablet (0.4 mg total) under the tongue every 5 (five) minutes as needed. 25 tablet 12  . ticagrelor (BRILINTA) 90 MG TABS tablet Take 1 tablet (90 mg total) by mouth 2 (two) times daily. 60 tablet 6  . famotidine (PEPCID) 20 MG tablet Take 1 tablet (20 mg total) by mouth 2 (two) times daily. (Patient not taking: Reported on 05/20/2017) 10 tablet 0  . lisinopril (PRINIVIL,ZESTRIL) 2.5 MG tablet Take 1 tablet (2.5 mg total) by mouth daily. (Patient not taking: Reported on 06/24/2017) 30 tablet 6   No  facility-administered medications prior to visit.      Allergies:   Patient has no known allergies.   Social History   Socioeconomic History  . Marital status: Single    Spouse name: Not on file  . Number of children: Not on file  . Years of education: Not on file  . Highest education level: Not on file  Occupational History  . 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 Financial resource strain: Not on file  . Food insecurity:    Worry: Not on file    Inability: Not on file  . Transportation needs:    Medical: Not on file    Non-medical: Not on file  Tobacco Use  . Smoking status: Former Smoker  Types: Cigars    Last attempt to quit: 03/29/2017    Years since quitting: 0.2  . Smokeless tobacco: Never Used  . Tobacco comment: cigar  Substance and Sexual Activity  . Alcohol use: No  . Drug use: No  . Sexual activity: Not on file  Lifestyle  . Physical activity:    Days per week: Not on file    Minutes per session: Not on file  . Stress: Not on file  Relationships  . Social connections:    Talks on phone: Not on file    Gets together: Not on file    Attends religious service: Not on file    Active member of club or organization: Not on file    Attends meetings of clubs or organizations: Not on file    Relationship status: Not on file  Other Topics Concern  . Not on file  Social History Narrative  . Not on file     Family History:  The patient's family history includes Alzheimer's disease in his mother; CAD in his father; Heart failure in his father.   ROS:   Please see the history of present illness.    ROS All other systems reviewed and are negative.   PHYSICAL EXAM:   VS:  BP 117/75   Pulse 68   Ht 5\' 8"  (1.727 m)   Wt 188 lb (85.3 kg)   BMI 28.59 kg/m GENERAL:  Well appearing HEENT:  PERRL, EOMI, sclera are clear. Oropharynx is clear. NECK:  No jugular venous distention, carotid upstroke brisk and symmetric, no  bruits, no thyromegaly or adenopathy LUNGS:  Clear to auscultation bilaterally CHEST:  Unremarkable HEART:  RRR,  PMI not displaced or sustained,S1 and S2 within normal limits, no S3, no S4: no clicks, no rubs, no murmurs ABD:  Soft, nontender. BS +, no masses or bruits. No hepatomegaly, no splenomegaly EXT:  2 + pulses throughout, no edema, no cyanosis no clubbing SKIN:  Warm and dry.  No rashes NEURO:  Alert and oriented x 3. Cranial nerves II through XII intact. PSYCH:  Cognitively intact    Wt Readings from Last 3 Encounters:  07/08/17 188 lb (85.3 kg)  05/20/17 194 lb 3.6 oz (88.1 kg)  05/07/17 197 lb (89.4 kg)      Studies/Labs Reviewed:   EKG:  EKG is ordered today.  The ekg ordered today demonstrates normal sinus rhythm with T wave inversion in inferior and anterior leads.  Unchanged when compared to the previous EKG.  A copy of his today's EKG will be given to the patient since he is traveling between [place] and the [place] area.  Recent Labs: 03/31/2017: Magnesium 2.0 04/01/2017: ALT 27 05/06/2017: Hemoglobin 16.0; Platelets 202 05/07/2017: B Natriuretic Peptide 78.9; BUN 26; Creatinine, Ser 1.04; Potassium 3.9; Sodium 139   Lipid Panel    Component Value Date/Time   CHOL 155 03/30/2017 0251   TRIG 191 (H) 03/30/2017 0251   HDL 26 (L) 03/30/2017 0251   CHOLHDL 6.0 03/30/2017 0251   VLDL 38 03/30/2017 0251   LDLCALC 91 03/30/2017 0251   Labs dated 06/03/17: CDRP 0.48, A1c 6% Cholesterol 91, triglycerides 64, HDL 34, LDL 44.   Additional studies/ records that were reviewed today include:   Cath 03/29/2017 Conclusion     Prox LAD lesion is 100% stenosed.  Post intervention, there is a 0% residual stenosis.  A drug-eluting stent was successfully placed using a STENT SYNERGY DES 3.5X16.  The left ventricular systolic function is  normal.  LV end diastolic pressure is normal.  The left ventricular ejection fraction is 45-50% by visual estimate.  1.  Single vessel occlusive CAD- 100% mid LAD with collaterals 2. Mild LV dysfunction. EF 45% 3. Normal LVEDP 4. Successful aspiration thrombectomy and stenting of the mid LAD with DES   Plan: DAPT with ASA and Brilinta. IV Aggrastat for 18 hours. May be a candidate for DC Sunday if no complications. High dose statin.    Echo 03/30/2017 LV EF: 40% - 45%  Study Conclusions  - Left ventricle: Septal and apical akinesis inferior wall hypokinesis The cavity size was mildly dilated. Wall thickness was increased in a pattern of mild LVH. Systolic function was mildly to moderately reduced. The estimated ejection fraction was in the range of 40% to 45%. Left ventricular diastolic function parameters were normal. - Atrial septum: No defect or patent foramen ovale was identified.   Cath 04/10/2017 Conclusion     Previously placed Prox LAD stent (unknown type) is widely patent.  LV end diastolic pressure is normal.   1. No significant obstructive CAD. The stent in the LAD is widely patent. 2. Normal LVEDP  Plan: continue medical therapy.      ASSESSMENT:    1. Coronary artery disease involving native coronary artery of native heart with angina pectoris ([place])   2. OSA (obstructive sleep apnea)   3. Hyperlipidemia, unspecified hyperlipidemia type      PLAN:  In order of problems listed above:   1. CAD: NSTEMI with DES of the proximal LAD in January.  Repeat cardiac catheterization near the end of January showed patent stent. CPX last week reportedly normal. Overall he is doing very well. Still has issues with anxiety. Participating in [employer]. Due to persistent HA and lightheadedness I have recommended he stop Imdur completely. Will remain on DAPT, high dose statin, and Toprol.   2. Hyperlipidemia: excellent control on statin.  3. Polycythemia: Managed by Dr. Earlie Server  4. Obstructive sleep apnea: Managed by [name].  Follow up tomorrow.      Medication Adjustments/Labs and Tests Ordered: Current medicines are reviewed at length with the patient today.  Concerns regarding medicines are outlined above.  Medication changes, Labs and Tests ordered today are listed in the Patient Instructions below. Patient Instructions  Continue your current therapy  I will see you in 6 months      Signed, Marrah Vanevery Martinique, MD  07/09/2017 7:15 AM    [facility] [address], [place], [place]  [address] Phone: [phone]; Fax: [phone]

## 2017-07-08 ENCOUNTER — Encounter: Payer: Self-pay | Admitting: Cardiology

## 2017-07-08 ENCOUNTER — Ambulatory Visit (INDEPENDENT_AMBULATORY_CARE_PROVIDER_SITE_OTHER): Payer: BLUE CROSS/BLUE SHIELD | Admitting: Cardiology

## 2017-07-08 ENCOUNTER — Ambulatory Visit (HOSPITAL_COMMUNITY): Payer: BLUE CROSS/BLUE SHIELD

## 2017-07-08 ENCOUNTER — Encounter (HOSPITAL_COMMUNITY): Payer: BLUE CROSS/BLUE SHIELD

## 2017-07-08 VITALS — BP 117/75 | HR 68 | Ht 68.0 in | Wt 188.0 lb

## 2017-07-08 DIAGNOSIS — E785 Hyperlipidemia, unspecified: Secondary | ICD-10-CM | POA: Diagnosis not present

## 2017-07-08 DIAGNOSIS — G4733 Obstructive sleep apnea (adult) (pediatric): Secondary | ICD-10-CM | POA: Diagnosis not present

## 2017-07-08 DIAGNOSIS — I25119 Atherosclerotic heart disease of native coronary artery with unspecified angina pectoris: Secondary | ICD-10-CM | POA: Diagnosis not present

## 2017-07-08 NOTE — Patient Instructions (Signed)
Continue your current therapy  I will see you in 6 months.   

## 2017-07-09 ENCOUNTER — Ambulatory Visit (INDEPENDENT_AMBULATORY_CARE_PROVIDER_SITE_OTHER): Payer: BLUE CROSS/BLUE SHIELD | Admitting: Cardiovascular Disease

## 2017-07-09 ENCOUNTER — Ambulatory Visit: Payer: BLUE CROSS/BLUE SHIELD | Admitting: Cardiovascular Disease

## 2017-07-09 ENCOUNTER — Encounter: Payer: Self-pay | Admitting: Cardiovascular Disease

## 2017-07-09 VITALS — BP 116/76 | HR 80 | Ht 70.0 in | Wt 185.8 lb

## 2017-07-09 DIAGNOSIS — G4733 Obstructive sleep apnea (adult) (pediatric): Secondary | ICD-10-CM | POA: Diagnosis not present

## 2017-07-09 DIAGNOSIS — I25119 Atherosclerotic heart disease of native coronary artery with unspecified angina pectoris: Secondary | ICD-10-CM | POA: Diagnosis not present

## 2017-07-09 DIAGNOSIS — R0600 Dyspnea, unspecified: Secondary | ICD-10-CM | POA: Diagnosis not present

## 2017-07-09 DIAGNOSIS — I214 Non-ST elevation (NSTEMI) myocardial infarction: Secondary | ICD-10-CM

## 2017-07-09 DIAGNOSIS — E785 Hyperlipidemia, unspecified: Secondary | ICD-10-CM

## 2017-07-09 NOTE — Patient Instructions (Signed)
Follow-Up: Your physician wants you to follow-up in: 6 months with Dr. Kelly (sleep clinic). You will receive a reminder letter in the mail two months in advance. If you don't receive a letter, please call our office to schedule the follow-up appointment.   If you need a refill on your cardiac medications before your next appointment, please call your pharmacy.   

## 2017-07-10 ENCOUNTER — Encounter (HOSPITAL_COMMUNITY): Payer: BLUE CROSS/BLUE SHIELD

## 2017-07-10 ENCOUNTER — Ambulatory Visit (HOSPITAL_COMMUNITY): Payer: BLUE CROSS/BLUE SHIELD

## 2017-07-11 ENCOUNTER — Encounter: Payer: Self-pay | Admitting: Cardiovascular Disease

## 2017-07-11 DIAGNOSIS — I252 Old myocardial infarction: Secondary | ICD-10-CM | POA: Diagnosis not present

## 2017-07-11 DIAGNOSIS — Z955 Presence of coronary angioplasty implant and graft: Secondary | ICD-10-CM | POA: Diagnosis not present

## 2017-07-11 NOTE — Progress Notes (Signed)
Cardiology Office Note    Date:  07/11/2017   ID:  James [name], DOB [birth year], MRN [id number]  PCP:  [name], MD  Cardiologist:  Shelva Majestic, MD   Initial sleep evaluation.  History of Present Illness:  James [name] is a 57 y.o. male who is status post a recent PCI to a totally occluded LAD.  James [name] was referred for sleep evaluation and I initially saw him in April 11, 2017.  James [name] presents now for follow-up evaluation.  James [name] has a history of obstructive sleep apnea which was initially diagnosed in May 2007 when James [name] underwent a nocturnal polysomnogram which was interpreted by Dr. Annamaria Boots.  At that time, James [name] had severe sleep apnea with an AHI of 45.9 per hour; random AHI was 43.1 per hour.  James [name] had very loud snoring with oxygen desaturation to a nadir of 72%.  Apparently, James [name] was never started on CPAP therapy, but at some point had an oral appliance customized which James [name] had worn intermittently but had not used in some time.  James [name] exercises frequently and works out regularly at sport time.  On January 18 while doing a cardiac workout.  James [name] began to notice substernal chest tightness.  James [name] saw his PCP and was told to take antacids.  Cover laboratory was drawn and James [name] was later notified that troponins were elevated.  James [name] presented to the hospital and underwent cardiac catheterization by [name] on January 18 in the early evening and was found to have total mid LAD occlusion which was successfully stented.  James [name] was seen on 04/09/2017 by Almyra Deforest in the office follow-up and at that time due to his poor sleep.  James [name] was advised to undergo a sleep evaluation and was scheduled to see me today.  However, later that night James [name] again experienced recurrent chest pain, was rehospitalized and underwent repeat cardiac catheterization yesterday which revealed a widely patent stent.  James [name] was discharged early this morning and presented to the office for further evaluation with me later that day.  When I saw  him James [name] was having anxiety o some anxiety following his heart attack.  James [name] is concerned that James [name] needs to get his sleep apnea treated as soon as possible.  An echo Doppler study on 03/30/2017 showed an EF of 40-45%.  There was mild LVH.  There was septal and apical akinesis. [name]  James [name] admits to slowing awakening 1-2 times per night, nonrestorative sleep, and daytime sleepiness.  An Epworth Sleepiness Scale score was calculated in the office  which endorsed at 17 and was consistent with excessive daytime sleepiness as shown below:  Epworth Sleepiness Scale: Situation   Chance of Dozing/Sleeping (0 = never , 1 = slight chance , 2 = moderate chance , 3 = high chance )   sitting and reading 3   watching TV 3   sitting inactive in a public place 3   being a passenger in a motor vehicle for an hour or more 1   lying down in the afternoon 3   sitting and talking to someone 0   sitting quietly after lunch (no alcohol) 3   while stopped for a few minutes in traffic as the driver 1   Total Score  17    When I initially saw him, and I spent a considerable amount of time with him reviewing his hospitalization as well as his reason to reinitiate CPAP therapy.  Since I saw him, we had numerous  conversations with insurance company James [name] would denied his follow-up sleep study.  Ultimately after long discussions the insurance company agreed for him to have AutoPap titration at home.  His set up date via [employer] as his DME company was May 16, 2017.  I reviewed a download from June 01, 2017 through June 30, 2017 which revealed that James [name] did not start using CPAP therapy until April 9.  James [name] used it every day for 13 days at the end of that cycle but had significant mask leak.  I then obtain a new download from April 1 through July 09, 2017 which again demonstrates 13 stretch where James [name] used therapy but James [name] has not used CPAP since April 21.  As result James [name] is not compliant presently with usage days at only 43% with average use is at 5  hours and 16 minutes.  James [name] has been participating in a weight loss program and has lost 12 pounds.  James [name] is not lifting for mass as James [name] had in the past and James [name] believes James [name] is lost some of his pectoral size.  James [name] believes the reduction of this may accommodate for his need for CPAP use.  James [name] presents for evaluation.  Past Medical History:  Diagnosis Date  . Acute systolic congestive heart failure ([place])   . Anemia    slight anemia with hepatitis tx  . Arthritis    oa  . Cancer ([place]) 4 months ago   basal cell skin cancer removed from chest area  . GERD (gastroesophageal reflux disease)   . Hepatitis C 04/29/2012   had savalta treatment 2 years ago, now cured  . Hyperlipidemia   . NSTEMI (non-ST elevated myocardial infarction) ([place]) 03/29/2017   1/19 PCI/DESx1 to mLAD, EF 45%  . Palpitations    at times     Past Surgical History:  Procedure Laterality Date  . ARTHROSCOPIC REPAIR ACL Right 15 years ago  . CORONARY STENT INTERVENTION N/A 03/29/2017   Procedure: CORONARY STENT INTERVENTION;  Surgeon: Martinique, Peter M, MD;  Location: [facility];  Service: Cardiovascular;  Laterality: N/A;  . CORONARY THROMBECTOMY N/A 03/29/2017   Procedure: Coronary Thrombectomy;  Surgeon: Martinique, Peter M, MD;  Location: [facility];  Service: Cardiovascular;  Laterality: N/A;  . LEFT HEART CATH AND CORONARY ANGIOGRAPHY N/A 03/29/2017   Procedure: LEFT HEART CATH AND CORONARY ANGIOGRAPHY;  Surgeon: Martinique, Peter M, MD;  Location: [facility];  Service: Cardiovascular;  Laterality: N/A;  . LEFT HEART CATH AND CORONARY ANGIOGRAPHY N/A 04/10/2017   Procedure: LEFT HEART CATH AND CORONARY ANGIOGRAPHY;  Surgeon: Martinique, Peter M, MD;  Location: [facility];  Service: Cardiovascular;  Laterality: N/A;  . TOTAL HIP ARTHROPLASTY Right 01/11/2015   Procedure: RIGHT TOTAL HIP ARTHROPLASTY ANTERIOR APPROACH;  Surgeon: Paralee Cancel, MD;  Location: [facility];  Service: Orthopedics;  Laterality: Right;     Current Medications: Outpatient Medications Prior to Visit  Medication Sig Dispense Refill  . ALPRAZolam (XANAX) 0.25 MG tablet Take 1 tablet (0.25 mg total) by mouth 2 (two) times daily as needed for anxiety. 20 tablet 0  . aspirin 81 MG tablet Take 1 tablet (81 mg total) by mouth daily. 90 tablet 3  . atorvastatin (LIPITOR) 80 MG tablet Take 1 tablet (80 mg total) by mouth daily at 6 PM. 30 tablet 6  . colchicine 0.6 MG tablet Take 1 tablet (0.6 mg total) by mouth 2 (two) times daily as needed (gout flare up). 60 tablet  6  . metoprolol succinate (TOPROL-XL) 25 MG 24 hr tablet Take 1 tablet (25 mg total) by mouth daily. 30 tablet 6  . nitroGLYCERIN (NITROSTAT) 0.4 MG SL tablet Place 1 tablet (0.4 mg total) under the tongue every 5 (five) minutes as needed. 25 tablet 12  . ticagrelor (BRILINTA) 90 MG TABS tablet Take 1 tablet (90 mg total) by mouth 2 (two) times daily. 60 tablet 6  . isosorbide mononitrate (IMDUR) 30 MG 24 hr tablet Take 0.5 tablets by mouth daily.     No facility-administered medications prior to visit.      Allergies:   Patient has no known allergies.   Social History   Socioeconomic History  . Marital status: Single    Spouse name: Not on file  . Number of children: Not on file  . Years of education: Not on file  . Highest education level: Not on file  Occupational History  . 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 Financial resource strain: Not on file  . Food insecurity:    Worry: Not on file    Inability: Not on file  . Transportation needs:    Medical: Not on file    Non-medical: Not on file  Tobacco Use  . Smoking status: Former Smoker    Types: Cigars    Last attempt to quit: 03/29/2017    Years since quitting: 0.2  . Smokeless tobacco: Never Used  . Tobacco comment: cigar  Substance and Sexual Activity  . Alcohol use: No  . Drug use: No  . Sexual activity: Not on file  Lifestyle  . Physical activity:     Days per week: Not on file    Minutes per session: Not on file  . Stress: Not on file  Relationships  . Social connections:    Talks on phone: Not on file    Gets together: Not on file    Attends religious service: Not on file    Active member of club or organization: Not on file    Attends meetings of clubs or organizations: Not on file    Relationship status: Not on file  Other Topics Concern  . Not on file  Social History Narrative  . Not on file    Socially James [name] is engaged to be remarried.  James [name] is  previously married and has 2 daughters, ages 58 and 87 and a grandchild.  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   Family History:  The patient's family history includes Alzheimer's disease in his mother; CAD in his father; Heart failure in his father.  His mother is alive at age 23 and father at age 25.  His father had CAD and CABG revascularization.  James [name] has one brother and one sister.  ROS General: Negative; No fevers, chills, or night sweats;  HEENT: Negative; No changes in vision or hearing, sinus congestion, difficulty swallowing Pulmonary: Negative; No cough, wheezing, shortness of breath, hemoptysis Cardiovascular: Status post recent non-STEMI/secondary to LAD occlusion GI: Negative; No nausea, vomiting, diarrhea, or abdominal pain GU: Negative; No dysuria, hematuria, or difficulty voiding Musculoskeletal: Negative; no myalgias, joint pain, or weakness Hematologic/Oncology: Negative; no easy bruising, bleeding Endocrine: Negative; no heat/cold intolerance; no diabetes Neuro: Negative; no changes in balance, headaches Skin: Negative; No rashes or skin lesions Psychiatric: Negative; No behavioral problems, depression Sleep: Positive for severe OSA diagnosed in 2007; has an oral appliance,  but has not been using.  Significant snoring, daytime sleepiness, hypersomnolence,; No bruxism, restless legs, hypnogognic  hallucinations, no cataplexy Other comprehensive 14 point system review is negative.   PHYSICAL EXAM:   VS:  BP 116/76   Pulse 80   Ht 5' 10"  (1.778 m)   Wt 185 lb 12.8 oz (84.3 kg)   BMI 26.66 kg/m     Repeat blood pressure by me was 110/74  Wt Readings from Last 3 Encounters:  07/09/17 185 lb 12.8 oz (84.3 kg)  07/08/17 188 lb (85.3 kg)  05/20/17 194 lb 3.6 oz (88.1 kg)    General: Alert, oriented, no distress.  Skin: normal turgor, no rashes, warm and dry HEENT: Normocephalic, atraumatic. Pupils equal round and reactive to light; sclera anicteric; extraocular muscles intact; Nose without nasal septal hypertrophy Mouth/Parynx benign; Mallinpatti scale 3 in the sitting position, but for in the supine position Neck: No JVD, no carotid bruits; normal carotid upstroke Lungs: clear to ausculatation and percussion; no wheezing or rales Chest wall: without tenderness to palpitation Heart: PMI not displaced, RRR, s1 s2 normal, 1/6 systolic murmur, no diastolic murmur, no rubs, gallops, thrills, or heaves Abdomen: soft, nontender; no hepatosplenomehaly, BS+; abdominal aorta nontender and not dilated by palpation. Back: no CVA tenderness Pulses 2+ Musculoskeletal: full range of motion, normal strength, no joint deformities Extremities: no clubbing cyanosis or edema, Homan's sign negative  Neurologic: grossly nonfocal; Cranial nerves grossly wnl Psychologic: Normal mood and affect   Studies/Labs Reviewed:   EKG:  EKG is not ordered today.    Recent Labs: BMP Latest Ref Rng & Units 05/07/2017 05/06/2017 04/11/2017  Glucose 65 - 99 mg/dL 104(H) 95 102(H)  BUN 6 - 20 mg/dL 26(H) 28(H) 21(H)  Creatinine 0.61 - 1.24 mg/dL 1.04 1.37(H) 1.19  Sodium 135 - 145 mmol/L 139 138 140  Potassium 3.5 - 5.1 mmol/L 3.9 4.3 4.7  Chloride 101 - 111 mmol/L 109 104 109  CO2 22 - 32 mmol/L 20(L) 23 23  Calcium 8.9 - 10.3 mg/dL 9.0 9.7 8.9     Hepatic Function Latest Ref Rng & Units 04/01/2017  03/29/2017 03/29/2017  Total Protein 6.5 - 8.1 g/dL 5.9(L) 6.3(L) 7.3  Albumin 3.5 - 5.0 g/dL 3.2(L) 3.8 4.2  AST 15 - 41 U/L 45(H) 166(H) 97(H)  ALT 17 - 63 U/L 27 36 34  Alk Phosphatase 38 - 126 U/L 54 54 60  Total Bilirubin 0.3 - 1.2 mg/dL 0.6 0.9 0.4  Bilirubin, Direct 0.1 - 0.5 mg/dL - 0.2 0.1    CBC Latest Ref Rng & Units 05/06/2017 04/10/2017 04/08/2017  WBC 4.0 - 10.5 K/uL 7.8 6.9 9.1  Hemoglobin 13.0 - 17.0 g/dL 16.0 16.7 16.7  Hematocrit 39.0 - 52.0 % 46.8 49.4 49.7  Platelets 150 - 400 K/uL 202 235 257   Lab Results  Component Value Date   MCV 90.3 05/06/2017   MCV 90.3 04/10/2017   MCV 90.0 04/08/2017   No results found for: TSH Lab Results  Component Value Date   HGBA1C 5.5 03/29/2017     BNP    Component Value Date/Time   BNP 78.9 05/07/2017 0144    ProBNP No results found for: PROBNP   Lipid Panel     Component Value Date/Time   CHOL 155 03/30/2017 0251   TRIG 191 (H) 03/30/2017 0251   HDL 26 (L) 03/30/2017 0251   CHOLHDL 6.0 03/30/2017 0251   VLDL 38 03/30/2017 0251   LDLCALC 91 03/30/2017 0251  RADIOLOGY: No results found.   Additional studies/ records that were reviewed today include:  I reviewed the patient's remote polysomnogram of 2007.  I reviewed his most recent hospitalizations and cardiac catheterizations as well as office note.   ASSESSMENT:    1. OSA (obstructive sleep apnea)   2. NSTEMI (non-ST elevated myocardial infarction) ([place])   3. Coronary artery disease involving native coronary artery of native heart with angina pectoris ([place])   4. Hyperlipidemia, unspecified hyperlipidemia type     PLAN:  James [name] is a 57 year old Caucasian male who developed chest pain while working out at [employer] and was found to have total occlusion of his mid LAD March 29, 2017.  James [name] underwent delayed but successful revascularization with DES stenting.  Retrospectively, James [name] has a history of obstructive sleep apnea documented since  2007.  At that time his sleep apnea was severe.  It does not appear James [name] ever initiated CPAP therapy, but ultimately received a customized oral appliance which James [name] had not been  wearing.  When I initially saw him in the office James [name] was 10 days following his acute coronary syndrome and had been readmitted for recurrent chest pain and had undergone repeat cardiac catheterization.  During that evaluation had a very long discussion with him regarding the importance of CPAP therapy.  James [name] had awakened from sleep at night with chest pain leading to his presentation.  Remotely James [name] had severe sleep apnea previously documented.  There was an attempt to try to have him undergo a new sleep study but despite numerous discussions this was denied by insurance.  James [name] ultimately has been approved for an AutoPap titration.  I reviewed the data several downloads with him in detail today.  James [name] is not yet compliant.  James [name] has been having significant mask leak and recently his mask was changed to an air fit F 30 which is improved from his previous masks.  James [name] was feeling that since James [name] is lost some muscle mass in the pectoral region not working out as excessively as James [name] had previously that James [name] may not need the CPAP therapy.  In the supine position his Mallinpatti scale is 4 and CPAP therapy is still indicated.  His current AHI was elevated at 8.5.  I had a long discussion with James [name] and his new wife concerning the absolute importance of meeting compliance.  James [name] has slept better and appears to be getting more used to using treatment.  James [name] has until August 16, 2017 to meet compliance standards.  I again discussed at length adverse consequences of untreated sleep apnea with reference to his cardiovascular health.  His blood pressure is stable.  James [name] has not had recent recurrent anginal symptoms.  James [name] has seen [name] in the office for his cardiology evaluation yesterday and I reviewed his office note in detail.  Since initiating treatment, a new at Epworth  Sleepiness Scale score was endorsed in the office today and this improved at 7.  A new download will be obtained within the month.  As long as James [name] is meeting compliance standards, I will see him in 6 months for reevaluation.  Medication Adjustments/Labs and Tests Ordered: Current medicines are reviewed at length with the patient today.  Concerns regarding medicines are outlined above.  Medication changes, Labs and Tests ordered today are listed in the Patient Instructions below. Patient Instructions  Follow-Up: Your physician wants you to follow-up in: 6 months with [name] (sleep clinic). You will receive a reminder letter in the  mail two months in advance. If you don't receive a letter, please call our office to schedule the follow-up appointment.    If you need a refill on your cardiac medications before your next appointment, please call your pharmacy.     Time spent: 25 minutes  Signed, Shelva Majestic, MD  07/11/2017 6:30 PM    [facility] [address], [place], [place],   [address] Phone: [phone]

## 2017-07-12 ENCOUNTER — Ambulatory Visit (HOSPITAL_COMMUNITY): Payer: BLUE CROSS/BLUE SHIELD

## 2017-07-12 ENCOUNTER — Encounter (HOSPITAL_COMMUNITY): Payer: BLUE CROSS/BLUE SHIELD

## 2017-07-15 ENCOUNTER — Encounter (HOSPITAL_COMMUNITY): Payer: BLUE CROSS/BLUE SHIELD

## 2017-07-15 ENCOUNTER — Ambulatory Visit: Payer: BLUE CROSS/BLUE SHIELD | Admitting: Cardiovascular Disease

## 2017-07-15 ENCOUNTER — Ambulatory Visit (HOSPITAL_COMMUNITY): Payer: BLUE CROSS/BLUE SHIELD

## 2017-07-16 DIAGNOSIS — Z955 Presence of coronary angioplasty implant and graft: Secondary | ICD-10-CM | POA: Diagnosis not present

## 2017-07-16 DIAGNOSIS — I252 Old myocardial infarction: Secondary | ICD-10-CM | POA: Diagnosis not present

## 2017-07-17 ENCOUNTER — Ambulatory Visit (HOSPITAL_COMMUNITY): Payer: BLUE CROSS/BLUE SHIELD

## 2017-07-17 ENCOUNTER — Encounter (HOSPITAL_COMMUNITY): Payer: BLUE CROSS/BLUE SHIELD

## 2017-07-18 DIAGNOSIS — I25118 Atherosclerotic heart disease of native coronary artery with other forms of angina pectoris: Secondary | ICD-10-CM | POA: Diagnosis not present

## 2017-07-18 DIAGNOSIS — I252 Old myocardial infarction: Secondary | ICD-10-CM | POA: Diagnosis not present

## 2017-07-18 DIAGNOSIS — E785 Hyperlipidemia, unspecified: Secondary | ICD-10-CM | POA: Diagnosis not present

## 2017-07-18 DIAGNOSIS — I519 Heart disease, unspecified: Secondary | ICD-10-CM | POA: Diagnosis not present

## 2017-07-18 DIAGNOSIS — Z955 Presence of coronary angioplasty implant and graft: Secondary | ICD-10-CM | POA: Diagnosis not present

## 2017-07-18 DIAGNOSIS — Z720 Tobacco use: Secondary | ICD-10-CM | POA: Diagnosis not present

## 2017-07-19 ENCOUNTER — Encounter (HOSPITAL_COMMUNITY): Payer: BLUE CROSS/BLUE SHIELD

## 2017-07-19 ENCOUNTER — Ambulatory Visit (HOSPITAL_COMMUNITY): Payer: BLUE CROSS/BLUE SHIELD

## 2017-07-22 ENCOUNTER — Encounter (HOSPITAL_COMMUNITY): Payer: BLUE CROSS/BLUE SHIELD

## 2017-07-22 ENCOUNTER — Ambulatory Visit (HOSPITAL_COMMUNITY): Payer: BLUE CROSS/BLUE SHIELD

## 2017-07-23 DIAGNOSIS — I252 Old myocardial infarction: Secondary | ICD-10-CM | POA: Diagnosis not present

## 2017-07-23 DIAGNOSIS — Z955 Presence of coronary angioplasty implant and graft: Secondary | ICD-10-CM | POA: Diagnosis not present

## 2017-07-24 ENCOUNTER — Encounter (HOSPITAL_COMMUNITY): Payer: BLUE CROSS/BLUE SHIELD

## 2017-07-24 ENCOUNTER — Ambulatory Visit (HOSPITAL_COMMUNITY): Payer: BLUE CROSS/BLUE SHIELD

## 2017-07-25 DIAGNOSIS — I252 Old myocardial infarction: Secondary | ICD-10-CM | POA: Diagnosis not present

## 2017-07-25 DIAGNOSIS — Z955 Presence of coronary angioplasty implant and graft: Secondary | ICD-10-CM | POA: Diagnosis not present

## 2017-07-26 ENCOUNTER — Ambulatory Visit (HOSPITAL_COMMUNITY): Payer: BLUE CROSS/BLUE SHIELD

## 2017-07-26 ENCOUNTER — Encounter (HOSPITAL_COMMUNITY): Payer: BLUE CROSS/BLUE SHIELD

## 2017-07-26 DIAGNOSIS — M7061 Trochanteric bursitis, right hip: Secondary | ICD-10-CM | POA: Diagnosis not present

## 2017-07-26 DIAGNOSIS — Z471 Aftercare following joint replacement surgery: Secondary | ICD-10-CM | POA: Diagnosis not present

## 2017-07-26 DIAGNOSIS — M1611 Unilateral primary osteoarthritis, right hip: Secondary | ICD-10-CM | POA: Diagnosis not present

## 2017-07-26 DIAGNOSIS — Z96641 Presence of right artificial hip joint: Secondary | ICD-10-CM | POA: Diagnosis not present

## 2017-07-26 DIAGNOSIS — M25551 Pain in right hip: Secondary | ICD-10-CM | POA: Diagnosis not present

## 2017-07-29 ENCOUNTER — Encounter (HOSPITAL_COMMUNITY): Payer: BLUE CROSS/BLUE SHIELD

## 2017-07-29 ENCOUNTER — Ambulatory Visit (HOSPITAL_COMMUNITY): Payer: BLUE CROSS/BLUE SHIELD

## 2017-07-30 DIAGNOSIS — Z955 Presence of coronary angioplasty implant and graft: Secondary | ICD-10-CM | POA: Diagnosis not present

## 2017-07-30 DIAGNOSIS — I252 Old myocardial infarction: Secondary | ICD-10-CM | POA: Diagnosis not present

## 2017-07-31 ENCOUNTER — Ambulatory Visit (HOSPITAL_COMMUNITY): Payer: BLUE CROSS/BLUE SHIELD

## 2017-07-31 ENCOUNTER — Encounter (HOSPITAL_COMMUNITY): Payer: BLUE CROSS/BLUE SHIELD

## 2017-07-31 DIAGNOSIS — I2510 Atherosclerotic heart disease of native coronary artery without angina pectoris: Secondary | ICD-10-CM | POA: Diagnosis not present

## 2017-08-01 DIAGNOSIS — I252 Old myocardial infarction: Secondary | ICD-10-CM | POA: Diagnosis not present

## 2017-08-01 DIAGNOSIS — Z955 Presence of coronary angioplasty implant and graft: Secondary | ICD-10-CM | POA: Diagnosis not present

## 2017-08-02 ENCOUNTER — Ambulatory Visit (HOSPITAL_COMMUNITY): Payer: BLUE CROSS/BLUE SHIELD

## 2017-08-07 ENCOUNTER — Ambulatory Visit (HOSPITAL_COMMUNITY): Payer: BLUE CROSS/BLUE SHIELD

## 2017-08-07 DIAGNOSIS — R0982 Postnasal drip: Secondary | ICD-10-CM | POA: Diagnosis not present

## 2017-08-07 DIAGNOSIS — J3489 Other specified disorders of nose and nasal sinuses: Secondary | ICD-10-CM | POA: Diagnosis not present

## 2017-08-07 DIAGNOSIS — R0981 Nasal congestion: Secondary | ICD-10-CM | POA: Diagnosis not present

## 2017-08-07 DIAGNOSIS — J310 Chronic rhinitis: Secondary | ICD-10-CM | POA: Diagnosis not present

## 2017-08-08 ENCOUNTER — Telehealth: Payer: Self-pay | Admitting: Cardiology

## 2017-08-08 DIAGNOSIS — I519 Heart disease, unspecified: Secondary | ICD-10-CM | POA: Diagnosis not present

## 2017-08-08 DIAGNOSIS — Z955 Presence of coronary angioplasty implant and graft: Secondary | ICD-10-CM | POA: Diagnosis not present

## 2017-08-08 DIAGNOSIS — I25118 Atherosclerotic heart disease of native coronary artery with other forms of angina pectoris: Secondary | ICD-10-CM | POA: Diagnosis not present

## 2017-08-08 DIAGNOSIS — E785 Hyperlipidemia, unspecified: Secondary | ICD-10-CM | POA: Diagnosis not present

## 2017-08-08 DIAGNOSIS — I214 Non-ST elevation (NSTEMI) myocardial infarction: Secondary | ICD-10-CM | POA: Diagnosis not present

## 2017-08-08 NOTE — Telephone Encounter (Signed)
New message    Pt wife is calling asking if it's ok for the pt to use nasocort and a netipot for his allergies and sinuses. The allergist told him to do these but they want to check with his heart doctor.

## 2017-08-08 NOTE — Telephone Encounter (Signed)
Routed to pharmacist/Cheryl LPN

## 2017-08-09 ENCOUNTER — Ambulatory Visit (HOSPITAL_COMMUNITY): Payer: BLUE CROSS/BLUE SHIELD

## 2017-08-09 NOTE — Telephone Encounter (Signed)
Yes. Both products are safe to use.

## 2017-08-09 NOTE — Telephone Encounter (Signed)
Returned call to patient's wife Raquel's recommendations given.

## 2017-08-11 ENCOUNTER — Encounter: Payer: Self-pay | Admitting: Cardiovascular Disease

## 2017-08-12 ENCOUNTER — Ambulatory Visit (HOSPITAL_COMMUNITY): Payer: BLUE CROSS/BLUE SHIELD

## 2017-08-14 ENCOUNTER — Ambulatory Visit (HOSPITAL_COMMUNITY): Payer: BLUE CROSS/BLUE SHIELD

## 2017-08-16 ENCOUNTER — Ambulatory Visit (HOSPITAL_COMMUNITY): Payer: BLUE CROSS/BLUE SHIELD

## 2017-08-19 ENCOUNTER — Ambulatory Visit (HOSPITAL_COMMUNITY): Payer: BLUE CROSS/BLUE SHIELD

## 2017-08-20 ENCOUNTER — Other Ambulatory Visit: Payer: Self-pay | Admitting: Cardiology

## 2017-08-20 NOTE — Telephone Encounter (Signed)
Pt needing a refill on Brinita 90 mg tablet, dispensing 90 day supply with refills. Would like a call back when this is done. [phone]. Please address

## 2017-08-21 ENCOUNTER — Ambulatory Visit (HOSPITAL_COMMUNITY): Payer: BLUE CROSS/BLUE SHIELD

## 2017-08-22 MED ORDER — TICAGRELOR 90 MG PO TABS: 90.0000 mg | ORAL_TABLET | Freq: Two times a day (BID) | ORAL | 1 refills | Status: DC

## 2017-08-23 ENCOUNTER — Ambulatory Visit (HOSPITAL_COMMUNITY): Payer: BLUE CROSS/BLUE SHIELD

## 2017-08-26 ENCOUNTER — Ambulatory Visit (HOSPITAL_COMMUNITY): Payer: BLUE CROSS/BLUE SHIELD

## 2017-08-26 ENCOUNTER — Telehealth: Payer: Self-pay | Admitting: Cardiovascular Disease

## 2017-08-26 NOTE — Telephone Encounter (Signed)
New message   Patient states he has lost a lot of weight. His mask is not fitting, also has question about settings.   What problem are you experiencing? Needs equipment   1) Who is your medical equipment company? unknown   Please route to the sleep study assistant.

## 2017-08-26 NOTE — Telephone Encounter (Signed)
Returned a call to patient. He is requesting to use a different MDE company instead of [employer] to purchase supplies. Patient states that he will be paying out of pocket and will not be using his insurance company to pay for his CPAP supplies. He understood that in order to use his insurance he will need to have a HST, which at this time he does not want to do. Prescription written for patient to pick up for mask of choice with supplies. Also for a mask fitting. Patient states that he has lost weight and his current mask is too big.

## 2017-08-28 ENCOUNTER — Ambulatory Visit (HOSPITAL_COMMUNITY): Payer: BLUE CROSS/BLUE SHIELD

## 2017-08-30 ENCOUNTER — Ambulatory Visit (HOSPITAL_COMMUNITY): Payer: BLUE CROSS/BLUE SHIELD

## 2017-09-13 ENCOUNTER — Telehealth: Payer: Self-pay | Admitting: Cardiology

## 2017-09-13 MED ORDER — TICAGRELOR 90 MG PO TABS: 90.0000 mg | ORAL_TABLET | Freq: Two times a day (BID) | ORAL | 1 refills | Status: DC

## 2017-09-13 NOTE — Telephone Encounter (Signed)
New message     Requesting additional refills.   1. Which medications need to be refilled? (please list name of each medication and dose if known) ticagrelor (BRILINTA) 90 MG TABS tablet  2. Which pharmacy/location (including street and city if local pharmacy) is medication to be sent to? Phone [phone], [facility] [address], [place]  3. Do they need a 30 day or 90 day supply?[place]

## 2017-09-27 ENCOUNTER — Other Ambulatory Visit: Payer: Self-pay | Admitting: Physician Assistant

## 2017-09-27 NOTE — Telephone Encounter (Signed)
Rx request sent to pharmacy.  

## 2017-10-03 DIAGNOSIS — J343 Hypertrophy of nasal turbinates: Secondary | ICD-10-CM | POA: Diagnosis not present

## 2017-10-03 DIAGNOSIS — J302 Other seasonal allergic rhinitis: Secondary | ICD-10-CM | POA: Diagnosis not present

## 2017-10-03 DIAGNOSIS — J3489 Other specified disorders of nose and nasal sinuses: Secondary | ICD-10-CM | POA: Diagnosis not present

## 2017-10-07 DIAGNOSIS — E785 Hyperlipidemia, unspecified: Secondary | ICD-10-CM | POA: Diagnosis not present

## 2017-10-07 DIAGNOSIS — I519 Heart disease, unspecified: Secondary | ICD-10-CM | POA: Diagnosis not present

## 2017-10-07 DIAGNOSIS — I25118 Atherosclerotic heart disease of native coronary artery with other forms of angina pectoris: Secondary | ICD-10-CM | POA: Diagnosis not present

## 2017-10-07 DIAGNOSIS — Z720 Tobacco use: Secondary | ICD-10-CM | POA: Diagnosis not present

## 2017-10-15 ENCOUNTER — Telehealth: Payer: Self-pay | Admitting: Cardiology

## 2017-10-15 NOTE — Telephone Encounter (Signed)
Spoke with pt's wife who states he was seen by his cardiologist it [place] due to SOB. He's currently take 1/2 tablet of his 25 mg Metoprolol daily and was advised to stop it due to his SOB at appointment. Pt states he would like the input of [name] before he decides to stop medication.

## 2017-10-15 NOTE — Telephone Encounter (Signed)
New Message:     Pt said his Cardiologist in [place]  suggested that pt stopped taking his Metoprolol. Pt wants to know what Dr Martinique thinks about this.

## 2017-10-15 NOTE — Telephone Encounter (Signed)
I think it is reasonable to stop it and see if his breathing will improve.   Liberta Gimpel Martinique MD, [facility]

## 2017-10-24 DIAGNOSIS — J3489 Other specified disorders of nose and nasal sinuses: Secondary | ICD-10-CM | POA: Diagnosis not present

## 2017-10-24 DIAGNOSIS — J342 Deviated nasal septum: Secondary | ICD-10-CM | POA: Diagnosis not present

## 2017-10-31 NOTE — Telephone Encounter (Signed)
Returned call to patient advised Dr.Jordan felt it is ok to stop metoprolol to see if breathing improves.Advised to call back if he is still sob.

## 2017-11-21 DIAGNOSIS — L821 Other seborrheic keratosis: Secondary | ICD-10-CM | POA: Diagnosis not present

## 2017-11-21 DIAGNOSIS — D485 Neoplasm of uncertain behavior of skin: Secondary | ICD-10-CM | POA: Diagnosis not present

## 2017-11-21 DIAGNOSIS — L570 Actinic keratosis: Secondary | ICD-10-CM | POA: Diagnosis not present

## 2017-11-21 DIAGNOSIS — C44719 Basal cell carcinoma of skin of left lower limb, including hip: Secondary | ICD-10-CM | POA: Diagnosis not present

## 2017-11-21 DIAGNOSIS — D229 Melanocytic nevi, unspecified: Secondary | ICD-10-CM | POA: Diagnosis not present

## 2017-11-29 DIAGNOSIS — L298 Other pruritus: Secondary | ICD-10-CM | POA: Diagnosis not present

## 2017-11-29 DIAGNOSIS — Z1211 Encounter for screening for malignant neoplasm of colon: Secondary | ICD-10-CM | POA: Diagnosis not present

## 2017-11-29 DIAGNOSIS — B009 Herpesviral infection, unspecified: Secondary | ICD-10-CM | POA: Diagnosis not present

## 2017-12-05 ENCOUNTER — Other Ambulatory Visit: Payer: Self-pay | Admitting: Physician Assistant

## 2017-12-05 DIAGNOSIS — C44719 Basal cell carcinoma of skin of left lower limb, including hip: Secondary | ICD-10-CM | POA: Diagnosis not present

## 2017-12-21 ENCOUNTER — Other Ambulatory Visit: Payer: Self-pay | Admitting: Cardiology

## 2017-12-22 ENCOUNTER — Other Ambulatory Visit: Payer: Self-pay | Admitting: Physician Assistant

## 2017-12-23 NOTE — Telephone Encounter (Signed)
Rx has been sent to the pharmacy electronically. ° °

## 2017-12-25 DIAGNOSIS — R0602 Shortness of breath: Secondary | ICD-10-CM | POA: Diagnosis not present

## 2017-12-25 DIAGNOSIS — J302 Other seasonal allergic rhinitis: Secondary | ICD-10-CM | POA: Diagnosis not present

## 2017-12-25 DIAGNOSIS — R0981 Nasal congestion: Secondary | ICD-10-CM | POA: Diagnosis not present

## 2017-12-27 NOTE — Progress Notes (Signed)
Cardiology Office Note    Date:  12/30/2017   ID:  James [name] [name] [birth year], MRN [id number]  PCP:  James [name] Cruel, [name]  Cardiologist:  James [name] [name]   Chief Complaint  Patient presents with  . Coronary Artery Disease    History of Present Illness:  James [name] [name] is a 57 y.o. male with PMH of basal cell skin CA, polycythemia and hepatitis C presented with NSTEMI on 03/29/2017.  Troponin on a peak at 45.54.  Hemoglobin A1c 5.5. Total cholesterol 155, HDL 26, LDL 91, triglyceride 191.  Urgent cardiac catheterization on the same day showed 100% proximal LAD lesion treated with thrombectomy and 3.5 x 16 mm Synergy DES, EF 45%.  Postprocedure, he was on IV Aggrastat for 18 hours.  He was also placed on aspirin, Brilinta and high-dose statin.    He was seen by James [name] Deforest PA-C on 04/09/2017 for post-cath follow-up.  He denied any chest pain at the time.  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  He was having a lot of anxiety issues.  He was also referred  to James [name] [name] for management of obstructive sleep apnea.  He could not tolerate full facial mask in the past, however I think he can tolerate nasal device instead.  The  patient then presented to the hospital with chest pain on the following morning after the office visit.  Troponin was initially elevated and peaked at 2.7.  He underwent cardiac catheterization, however this showed his LAD stent was widely patent, there is mild jailing of the diagonal artery.  Retrospective, it was unclear if patient had transient partial stent thrombosis or coronary spasm at the end of the stent.  Echocardiogram was repeated which showed EF 55-60%, severe hypokinesis of the akinesis of mid anteroseptal and apical anterior wall, moderate LVH.  Ejection fraction has improved from the previous 40-45% on echo.  Low-dose nitrates were added, patient was  eventually discharged on 04/11/2017.  He was admitted in February again with atypical chest and epigastric pain. He ruled out for MI. Seen by our service and pain was felt to be noncardiac. Recommended outpatient psychiatry evaluation for severe anxiety.   He has also  been seen by a Cardiologist in [place]- James [name] [name]. Lisinopril discontinued due to dizziness and HA. Later nitrates also reduced and later discontinued. He underwent a CPX on 07/05/17 and although results are not available in [place] the patient reports he did very well. In August James [name] [name] recommended stopping metoprolol due to persistent symptoms of dyspnea. He has not done this yet. Only taking 12.5 mg daily.   He reports he is  following the [employer] program with diet and meditation. He has lost 6 lbs.  He still notes occasional tightness in his chest if he feels stressed emotionally. There is a focal area of discomfort in the left side towards the axilla. He is using CPAP. He exercises regularly riding his bike daily, lifting weights, playing golf. Overall feels great. Is going to have a basal cell growth removed from his left ankle in February.     Past Medical History:  Diagnosis Date  . Acute systolic congestive heart failure ([place])   . Anemia    slight anemia with hepatitis tx  . Arthritis    oa  . Cancer ([place]) 4 months ago   basal cell  skin cancer removed from chest area  . GERD (gastroesophageal reflux disease)   . Hepatitis C 04/29/2012   had savalta treatment 2 years ago, now cured  . Hyperlipidemia   . NSTEMI (non-ST elevated myocardial infarction) ([place]) 03/29/2017   1/19 PCI/DESx1 to mLAD, EF 45%  . Palpitations    at times     Past Surgical History:  Procedure Laterality Date  . ARTHROSCOPIC REPAIR ACL Right 15 years ago  . CORONARY STENT INTERVENTION N/A 03/29/2017   Procedure: CORONARY STENT INTERVENTION;  Surgeon: James [name] [name];  Location: [facility];  Service:  Cardiovascular;  Laterality: N/A;  . CORONARY THROMBECTOMY N/A 03/29/2017   Procedure: Coronary Thrombectomy;  Surgeon: James [name] [name], James [name] [name];  Location: [facility];  Service: Cardiovascular;  Laterality: N/A;  . LEFT HEART CATH AND CORONARY ANGIOGRAPHY N/A 03/29/2017   Procedure: LEFT HEART CATH AND CORONARY ANGIOGRAPHY;  Surgeon: James [name] [name], Yvonne Stopher M, [name];  Location: [facility];  Service: Cardiovascular;  Laterality: N/A;  . LEFT HEART CATH AND CORONARY ANGIOGRAPHY N/A 04/10/2017   Procedure: LEFT HEART CATH AND CORONARY ANGIOGRAPHY;  Surgeon: James [name] [name], James [name] [name];  Location: [facility];  Service: Cardiovascular;  Laterality: N/A;  . TOTAL HIP ARTHROPLASTY Right 01/11/2015   Procedure: RIGHT TOTAL HIP ARTHROPLASTY ANTERIOR APPROACH;  Surgeon: James [name] Cancel, [name];  Location: [facility];  Service: Orthopedics;  Laterality: Right;    Current Medications: Outpatient Medications Prior to Visit  Medication Sig Dispense Refill  . ALPRAZolam (XANAX) 0.25 MG tablet Take 1 tablet (0.25 mg total) by mouth 2 (two) times daily as needed for anxiety. 20 tablet 0  . aspirin 81 MG EC tablet TAKE 1 TABLET BY MOUTH EVERY DAY 90 tablet 1  . atorvastatin (LIPITOR) 80 MG tablet TAKE 1 TABLET (80 MG TOTAL) BY MOUTH DAILY AT 6 PM. 90 tablet 1  . BRILINTA 90 MG TABS tablet TAKE 1 TABLET (90 MG TOTAL) BY MOUTH 2 (TWO) TIMES DAILY. 90 tablet 1  . colchicine 0.6 MG tablet Take 1 tablet (0.6 mg total) by mouth 2 (two) times daily as needed (gout flare up). 60 tablet 6  . nitroGLYCERIN (NITROSTAT) 0.4 MG SL tablet Place 1 tablet (0.4 mg total) under the tongue every 5 (five) minutes as needed. 25 tablet 12  . metoprolol succinate (TOPROL-XL) 25 MG 24 hr tablet TAKE 1 TABLET BY MOUTH EVERY DAY 90 tablet 0   No facility-administered medications prior to visit.      Allergies:   Patient has no known allergies.   Social History   Socioeconomic History  . Marital status: Single    Spouse name: Not on file  .  Number of children: Not on file  . Years of education: Not on file  . Highest education level: Not on file  Occupational History  . 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 Financial resource strain: Not on file  . Food insecurity:    Worry: Not on file    Inability: Not on file  . Transportation needs:    Medical: Not on file    Non-medical: Not on file  Tobacco Use  . Smoking status: Former Smoker    Types: Cigars    Last attempt to quit: 03/29/2017    Years since quitting: 0.7  . Smokeless tobacco: Never Used  . Tobacco comment: cigar  Substance and Sexual Activity  . Alcohol use: No  . Drug  use: No  . Sexual activity: Not on file  Lifestyle  . Physical activity:    Days per week: Not on file    Minutes per session: Not on file  . Stress: Not on file  Relationships  . Social connections:    Talks on phone: Not on file    Gets together: Not on file    Attends religious service: Not on file    Active member of club or organization: Not on file    Attends meetings of clubs or organizations: Not on file    Relationship status: Not on file  Other Topics Concern  . Not on file  Social History Narrative  . Not on file     Family History:  The patient's family history includes Alzheimer's disease in his mother; CAD in his father; Heart failure in his father.   ROS:   Please see the history of present illness.    ROS All other systems reviewed and are negative.   PHYSICAL EXAM:   VS:  BP 116/80   Pulse 76   Ht 5\' 10"  (1.778 m)   Wt 179 lb (81.2 kg)   BMI 25.68 kg/m  GENERAL:  Well appearing WM in NAD HEENT:  PERRL, EOMI, sclera are clear. Oropharynx is clear. NECK:  No jugular venous distention, carotid upstroke brisk and symmetric, no bruits, no thyromegaly or adenopathy LUNGS:  Clear to auscultation bilaterally CHEST:  Unremarkable HEART:  RRR,  PMI not displaced or sustained,S1 and S2 within normal limits, no S3, no S4:  no clicks, no rubs, no murmurs ABD:  Soft, nontender. BS +, no masses or bruits. No hepatomegaly, no splenomegaly EXT:  2 + pulses throughout, no edema, no cyanosis no clubbing SKIN:  Warm and dry.  No rashes NEURO:  Alert and oriented x 3. Cranial nerves II through XII intact. PSYCH:  Cognitively intact      Wt Readings from Last 3 Encounters:  12/30/17 179 lb (81.2 kg)  07/09/17 185 lb 12.8 oz (84.3 kg)  07/08/17 188 lb (85.3 kg)      Studies/Labs Reviewed:   EKG:  EKG is not ordered today.    Recent Labs: 03/31/2017: Magnesium 2.0 04/01/2017: ALT 27 05/06/2017: Hemoglobin 16.0; Platelets 202 05/07/2017: B Natriuretic Peptide 78.9; BUN 26; Creatinine, Ser 1.04; Potassium 3.9; Sodium 139   Lipid Panel    Component Value Date/Time   CHOL 155 03/30/2017 0251   TRIG 191 (H) 03/30/2017 0251   HDL 26 (L) 03/30/2017 0251   CHOLHDL 6.0 03/30/2017 0251   VLDL 38 03/30/2017 0251   LDLCALC 91 03/30/2017 0251   Labs dated 06/03/17: CDRP 0.48, A1c 6% Cholesterol 91, triglycerides 64, HDL 34, LDL 44.   Additional studies/ records that were reviewed today include:   Cath 03/29/2017 Conclusion     Prox LAD lesion is 100% stenosed.  Post intervention, there is a 0% residual stenosis.  A drug-eluting stent was successfully placed using a STENT SYNERGY DES 3.5X16.  The left ventricular systolic function is normal.  LV end diastolic pressure is normal.  The left ventricular ejection fraction is 45-50% by visual estimate.  1. Single vessel occlusive CAD- 100% mid LAD with collaterals 2. Mild LV dysfunction. EF 45% 3. Normal LVEDP 4. Successful aspiration thrombectomy and stenting of the mid LAD with DES   Plan: DAPT with ASA and Brilinta. IV Aggrastat for 18 hours. May be a candidate for DC Sunday if no complications. High dose statin.    Echo  03/30/2017 LV EF: 40% - 45%  Study Conclusions  - Left ventricle: Septal and apical akinesis inferior  wall hypokinesis The cavity size was mildly dilated. Wall thickness was increased in a pattern of mild LVH. Systolic function was mildly to moderately reduced. The estimated ejection fraction was in the range of 40% to 45%. Left ventricular diastolic function parameters were normal. - Atrial septum: No defect or patent foramen ovale was identified.   Cath 04/10/2017 Conclusion     Previously placed Prox LAD stent (unknown type) is widely patent.  LV end diastolic pressure is normal.   1. No significant obstructive CAD. The stent in the LAD is widely patent. 2. Normal LVEDP  Plan: continue medical therapy.      ASSESSMENT:    1. Coronary artery disease involving native coronary artery of native heart with angina pectoris ([place])   2. OSA (obstructive sleep apnea)   3. Hyperlipidemia, unspecified hyperlipidemia type      PLAN:  In order of problems listed above:   1. CAD: NSTEMI with DES of the proximal LAD in January 2019.  Repeat cardiac catheterization near the end of January showed patent stent. CPX in follow up reportedly normal. Overall he is doing very well. Participating in [employer]. I have recommended he go ahead and stop Toprol XL.  Will remain on DAPT until January then stop Brilinta. Will stay on high dose statin and ASA.   2. Hyperlipidemia: excellent control on statin. Will repeat labs today.  3. Polycythemia: check CBC  4. Obstructive sleep apnea: Managed by James [name] [name]. On CPAP    Medication Adjustments/Labs and Tests Ordered: Current medicines are reviewed at length with the patient today.  Concerns regarding medicines are outlined above.  Medication changes, Labs and Tests ordered today are listed in the Patient Instructions below. Patient Instructions  Stop Toprol XL now  You may stop Brilinta after March 29, 2018.  We will check lab work today      [place], Serita Degroote Martinique, [name]  12/30/2017 8:21 AM    [address], [place], [place]  [address] Phone: [phone]; Fax: [phone]

## 2017-12-30 ENCOUNTER — Encounter: Payer: Self-pay | Admitting: Cardiology

## 2017-12-30 ENCOUNTER — Ambulatory Visit (INDEPENDENT_AMBULATORY_CARE_PROVIDER_SITE_OTHER): Payer: BLUE CROSS/BLUE SHIELD | Admitting: Cardiology

## 2017-12-30 VITALS — BP 116/80 | HR 76 | Ht 70.0 in | Wt 179.0 lb

## 2017-12-30 DIAGNOSIS — G4733 Obstructive sleep apnea (adult) (pediatric): Secondary | ICD-10-CM

## 2017-12-30 DIAGNOSIS — I25119 Atherosclerotic heart disease of native coronary artery with unspecified angina pectoris: Secondary | ICD-10-CM

## 2017-12-30 DIAGNOSIS — E785 Hyperlipidemia, unspecified: Secondary | ICD-10-CM

## 2017-12-30 NOTE — Patient Instructions (Signed)
Stop Toprol XL now  You may stop Brilinta after March 29, 2018.  We will check lab work today

## 2017-12-31 LAB — BASIC METABOLIC PANEL
BUN/Creatinine Ratio: 18 (ref 9–20)
BUN: 16 mg/dL (ref 6–24)
CO2: 22 mmol/L (ref 20–29)
Calcium: 9.8 mg/dL (ref 8.7–10.2)
Chloride: 102 mmol/L (ref 96–106)
Creatinine, Ser: 0.87 mg/dL (ref 0.76–1.27)
GFR calc Af Amer: 109 mL/min/{1.73_m2} (ref >59)
GFR calc non Af Amer: 94 mL/min/{1.73_m2} (ref >59)
Glucose: 79 mg/dL (ref 65–99)
Potassium: 4.4 mmol/L (ref 3.5–5.2)
Sodium: 142 mmol/L (ref 134–144)

## 2017-12-31 LAB — HEPATIC FUNCTION PANEL
ALT: 35 IU/L (ref 0–44)
AST: 31 IU/L (ref 0–40)
Albumin: 4.4 g/dL (ref 3.5–5.5)
Alkaline Phosphatase: 75 IU/L (ref 39–117)
Bilirubin Total: 0.4 mg/dL (ref 0.0–1.2)
Bilirubin, Direct: 0.14 mg/dL (ref 0.00–0.40)
Total Protein: 6.7 g/dL (ref 6.0–8.5)

## 2017-12-31 LAB — CBC WITH DIFFERENTIAL/PLATELET
Basophils Absolute: 0 10*3/uL (ref 0.0–0.2)
Basos: 0 %
EOS (ABSOLUTE): 0.1 10*3/uL (ref 0.0–0.4)
Eos: 1 %
Hematocrit: 41.5 % (ref 37.5–51.0)
Hemoglobin: 14.3 g/dL (ref 13.0–17.7)
Immature Grans (Abs): 0 10*3/uL (ref 0.0–0.1)
Immature Granulocytes: 0 %
Lymphocytes Absolute: 0.9 10*3/uL (ref 0.7–3.1)
Lymphs: 12 %
MCH: 32.5 pg (ref 26.6–33.0)
MCHC: 34.5 g/dL (ref 31.5–35.7)
MCV: 94 fL (ref 79–97)
Monocytes Absolute: 0.5 10*3/uL (ref 0.1–0.9)
Monocytes: 6 %
Neutrophils Absolute: 6.4 10*3/uL (ref 1.4–7.0)
Neutrophils: 81 %
Platelets: 204 10*3/uL (ref 150–450)
RBC: 4.4 x10E6/uL (ref 4.14–5.80)
RDW: 13.1 % (ref 12.3–15.4)
WBC: 7.9 10*3/uL (ref 3.4–10.8)

## 2017-12-31 LAB — LIPID PANEL W/O CHOL/HDL RATIO
Cholesterol, Total: 101 mg/dL (ref 100–199)
HDL: 38 mg/dL — ABNORMAL LOW (ref >39)
LDL Calculated: 45 mg/dL (ref 0–99)
Triglycerides: 92 mg/dL (ref 0–149)
VLDL Cholesterol Cal: 18 mg/dL (ref 5–40)

## 2018-01-17 DIAGNOSIS — Z01818 Encounter for other preprocedural examination: Secondary | ICD-10-CM | POA: Diagnosis not present

## 2018-02-05 DIAGNOSIS — H01024 Squamous blepharitis left upper eyelid: Secondary | ICD-10-CM | POA: Diagnosis not present

## 2018-02-05 DIAGNOSIS — H10413 Chronic giant papillary conjunctivitis, bilateral: Secondary | ICD-10-CM | POA: Diagnosis not present

## 2018-02-05 DIAGNOSIS — H01022 Squamous blepharitis right lower eyelid: Secondary | ICD-10-CM | POA: Diagnosis not present

## 2018-02-05 DIAGNOSIS — H01021 Squamous blepharitis right upper eyelid: Secondary | ICD-10-CM | POA: Diagnosis not present

## 2018-02-10 DIAGNOSIS — Z7902 Long term (current) use of antithrombotics/antiplatelets: Secondary | ICD-10-CM | POA: Diagnosis not present

## 2018-02-10 DIAGNOSIS — Z7982 Long term (current) use of aspirin: Secondary | ICD-10-CM | POA: Diagnosis not present

## 2018-02-10 DIAGNOSIS — Z955 Presence of coronary angioplasty implant and graft: Secondary | ICD-10-CM | POA: Diagnosis not present

## 2018-02-10 DIAGNOSIS — I2510 Atherosclerotic heart disease of native coronary artery without angina pectoris: Secondary | ICD-10-CM | POA: Diagnosis not present

## 2018-02-10 DIAGNOSIS — I10 Essential (primary) hypertension: Secondary | ICD-10-CM | POA: Diagnosis not present

## 2018-02-10 DIAGNOSIS — C44719 Basal cell carcinoma of skin of left lower limb, including hip: Secondary | ICD-10-CM | POA: Diagnosis not present

## 2018-02-10 DIAGNOSIS — G4730 Sleep apnea, unspecified: Secondary | ICD-10-CM | POA: Diagnosis not present

## 2018-02-10 DIAGNOSIS — I252 Old myocardial infarction: Secondary | ICD-10-CM | POA: Diagnosis not present

## 2018-02-27 ENCOUNTER — Telehealth: Payer: Self-pay | Admitting: Cardiology

## 2018-02-27 NOTE — Telephone Encounter (Signed)
New message    Pt wife wants to know what else pt can do for a nasal spray. She said they were told it was ok for him to take but it makes his heart flutter. She said it might be that or the claritin. Please call.

## 2018-02-27 NOTE — Telephone Encounter (Signed)
New message    Patient calling the office for samples of medication:   1.  What medication and dosage are you requesting samples for? Brilinta   2.  Are you currently out of this medication? Pt will be out in January and he's supposed to take until Feb 1st.

## 2018-02-27 NOTE — Telephone Encounter (Signed)
We are able to give patient 3 bottles of Brilinta  90 mg samples, lot #[id number], exp 09/2020   Wife will call back in a couple weeks to see if we get more in

## 2018-02-27 NOTE — Telephone Encounter (Signed)
I spoke with wife, pt will try Ocean (normal saline) nasal spray. She is also going to check that claritin is plain, without the "D"

## 2018-03-18 ENCOUNTER — Other Ambulatory Visit: Payer: Self-pay | Admitting: Cardiology

## 2018-03-18 NOTE — Telephone Encounter (Signed)
Rx request sent to pharmacy.  

## 2018-03-19 ENCOUNTER — Telehealth: Payer: Self-pay | Admitting: Cardiology

## 2018-03-19 NOTE — Telephone Encounter (Signed)
New Message         Patient wife is calling to see if patient can take a baby asprin along with atorvastatin in the morning or at night together after ending "Brilinta"?

## 2018-03-19 NOTE — Telephone Encounter (Signed)
Spoke with pt, aware the patient can take aspirin and atorvastatin together in the morning.

## 2018-03-21 ENCOUNTER — Other Ambulatory Visit: Payer: Self-pay | Admitting: Cardiology

## 2018-03-21 DIAGNOSIS — Z8619 Personal history of other infectious and parasitic diseases: Secondary | ICD-10-CM | POA: Diagnosis not present

## 2018-03-26 ENCOUNTER — Telehealth: Payer: Self-pay

## 2018-03-26 NOTE — Telephone Encounter (Signed)
   [facility] Pre-operative Risk Assessment    Request for surgical clearance:  1. What type of surgery is being performed? Colonoscopy  2. When is this surgery scheduled? TBD  3. What type of clearance is required (medical clearance vs. Pharmacy clearance to hold med vs. Both)? Both  4. Are there any medications that need to be held prior to surgery and how long? Brilinta and Aspirin  5. Practice name and name of physician performing surgery? [facility].Shirlee Latch PA  6. What is your office phone number [phone].   7.   What is your office fax number [phone].  8.   Anesthesia type Propofol   Thedore Mins Dawnisha Marquina 03/26/2018, 5:15 PM  _________________________________________________________________   (provider comments below)

## 2018-03-28 NOTE — Telephone Encounter (Signed)
   [name], can you address whether patient can hold aspirin and brilinta for colonoscopy? Last intervention 03/29/2017 with PCI/DES to pLAD.   Please route response back to pre-op pool for ongoing medical/pharmacy clearance.

## 2018-03-28 NOTE — Telephone Encounter (Signed)
He can hold brilinta. I don't think he needs to hold ASA. I would continue ASA  James [name] Martinique MD, [facility]

## 2018-03-28 NOTE — Telephone Encounter (Signed)
   Primary Cardiologist: [name], MD  Chart reviewed as part of pre-operative protocol coverage. Patient was contacted 03/28/2018 in reference to pre-operative risk assessment for pending surgery as outlined below.  James [name] was last seen on 12/30/17 by [name].  Since that day, James [name] has done well from a cardiac standpoint. He is quite active and exercises multiple days per week. He can easily complete 4 METS without chest pain or SOB. He reports occasional palpitations which has been a longstanding issue but no recent chest pain, SOB, DOE, orthopnea, PND, LE edema, dizziness, lightheadedness, or syncope. He will be stopping his brilinta at the end of January 2020 as instructed to do so by [name]. He should continue on aspirin without interruption for upcoming colonoscopy.   Therefore, based on ACC/AHA guidelines, the patient would be at acceptable risk for the planned procedure without further cardiovascular testing.   I will route this recommendation to the requesting party via Epic fax function and remove from pre-op pool.  Please call with questions.  Abigail Butts, PA-C 03/28/2018, 10:11 AM

## 2018-04-15 ENCOUNTER — Encounter: Payer: Self-pay | Admitting: Cardiology

## 2018-04-15 ENCOUNTER — Ambulatory Visit (INDEPENDENT_AMBULATORY_CARE_PROVIDER_SITE_OTHER): Payer: BLUE CROSS/BLUE SHIELD | Admitting: Cardiology

## 2018-04-15 VITALS — BP 126/84 | HR 72 | Ht 70.0 in | Wt 183.8 lb

## 2018-04-15 DIAGNOSIS — E785 Hyperlipidemia, unspecified: Secondary | ICD-10-CM | POA: Diagnosis not present

## 2018-04-15 DIAGNOSIS — I25119 Atherosclerotic heart disease of native coronary artery with unspecified angina pectoris: Secondary | ICD-10-CM | POA: Diagnosis not present

## 2018-04-15 NOTE — Progress Notes (Signed)
Cardiology Office Note    Date:  04/15/2018   ID:  James [name], DOB 1960/03/13, MRN [id number]  PCP:  [name], MD  Cardiologist:  [name]   Chief Complaint  Patient presents with  . Coronary Artery Disease    History of Present Illness:  James [name] is a 58 y.o. male with PMH of basal cell skin CA, polycythemia and hepatitis C presented with NSTEMI on 03/29/2017.  Troponin on a peak at 45.54.  Hemoglobin A1c 5.5. Total cholesterol 155, HDL 26, LDL 91, triglyceride 191.  Urgent cardiac catheterization on the same day showed 100% proximal LAD lesion treated with thrombectomy and 3.5 x 16 mm Synergy DES, EF 45%.  Postprocedure, he was on IV Aggrastat for 18 hours.  He was also placed on aspirin, Brilinta and high-dose statin.    He was seen by Almyra Deforest PA-C on 04/09/2017 for post-cath follow-up.  He denied any chest pain at the time.  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  He was having a lot of anxiety issues.  He was also referred  to [name] for management of obstructive sleep apnea.  He could not tolerate full facial mask in the past, however I think he can tolerate nasal device instead.  The  patient then presented to the hospital with chest pain on the following morning after the office visit.  Troponin was initially elevated and peaked at 2.7.  He underwent cardiac catheterization, however this showed his LAD stent was widely patent, there is mild jailing of the diagonal artery.  Retrospective, it was unclear if patient had transient partial stent thrombosis or coronary spasm at the end of the stent.  Echocardiogram was repeated which showed EF 55-60%, severe hypokinesis of the akinesis of mid anteroseptal and apical anterior wall, moderate LVH.  Ejection fraction has improved from the previous 40-45% on echo.  Low-dose nitrates were added, patient was  eventually discharged on 04/11/2017.  He was admitted in February 2019 again with atypical chest and epigastric pain. He ruled out for MI. Seen by our service and pain was felt to be noncardiac.   He has also  been seen by a Cardiologist in [place]- Dr. [name]. Lisinopril discontinued due to dizziness and HA. Later nitrates also reduced and later discontinued. He underwent a CPX on 07/05/17 and although results are not available in [place] the patient reports he did very well.   He reports he is  following the [employer] program with diet and meditation. He is using CPAP. He exercises regularly riding his bike daily, lifting weights, playing golf. Overall feels great. Since his last visit he is now off Toprol XL and Brilinta. He notes improvement in his breathing. No chest pain. Is planning to have a wisdom tooth removed and have a colonoscopy.    Past Medical History:  Diagnosis Date  . Acute systolic congestive heart failure ([place])   . Anemia    slight anemia with hepatitis tx  . Arthritis    oa  . Cancer ([place]) 4 months ago   basal cell skin cancer removed from chest area  . GERD (gastroesophageal reflux disease)   . Hepatitis C 04/29/2012   had savalta treatment 2 years ago, now cured  . Hyperlipidemia   . NSTEMI (non-ST elevated myocardial infarction) ([place]) 03/29/2017   1/19 PCI/DESx1 to  mLAD, EF 45%  . Palpitations    at times     Past Surgical History:  Procedure Laterality Date  . ARTHROSCOPIC REPAIR ACL Right 15 years ago  . CORONARY STENT INTERVENTION N/A 03/29/2017   Procedure: CORONARY STENT INTERVENTION;  Surgeon: Martinique, Raynie Steinhaus M, MD;  Location: [facility];  Service: Cardiovascular;  Laterality: N/A;  . CORONARY THROMBECTOMY N/A 03/29/2017   Procedure: Coronary Thrombectomy;  Surgeon: Martinique, Karianna Gusman M, MD;  Location: [facility];  Service: Cardiovascular;  Laterality: N/A;  . LEFT HEART CATH AND CORONARY ANGIOGRAPHY N/A 03/29/2017   Procedure:  LEFT HEART CATH AND CORONARY ANGIOGRAPHY;  Surgeon: Martinique, Asley Baskerville M, MD;  Location: [facility];  Service: Cardiovascular;  Laterality: N/A;  . LEFT HEART CATH AND CORONARY ANGIOGRAPHY N/A 04/10/2017   Procedure: LEFT HEART CATH AND CORONARY ANGIOGRAPHY;  Surgeon: Martinique, Yoshio Seliga M, MD;  Location: [facility];  Service: Cardiovascular;  Laterality: N/A;  . TOTAL HIP ARTHROPLASTY Right 01/11/2015   Procedure: RIGHT TOTAL HIP ARTHROPLASTY ANTERIOR APPROACH;  Surgeon: Paralee Cancel, MD;  Location: [facility];  Service: Orthopedics;  Laterality: Right;    Current Medications: Outpatient Medications Prior to Visit  Medication Sig Dispense Refill  . aspirin 81 MG EC tablet TAKE 1 TABLET BY MOUTH EVERY DAY 90 tablet 1  . atorvastatin (LIPITOR) 80 MG tablet TAKE 1 TABLET (80 MG TOTAL) BY MOUTH DAILY AT 6 PM. 90 tablet 1  . Multiple Vitamin (MULTIVITAMIN) tablet Take 1 tablet by mouth daily.    . valACYclovir (VALTREX) 500 MG tablet Take 1 tablet by mouth daily as needed.    . ALPRAZolam (XANAX) 0.25 MG tablet Take 1 tablet (0.25 mg total) by mouth 2 (two) times daily as needed for anxiety. (Patient not taking: Reported on 04/15/2018) 20 tablet 0  . colchicine 0.6 MG tablet Take 1 tablet (0.6 mg total) by mouth 2 (two) times daily as needed (gout flare up). (Patient not taking: Reported on 04/15/2018) 60 tablet 6  . nitroGLYCERIN (NITROSTAT) 0.4 MG SL tablet Place 1 tablet (0.4 mg total) under the tongue every 5 (five) minutes as needed. (Patient not taking: Reported on 04/15/2018) 25 tablet 12  . ticagrelor (BRILINTA) 90 MG TABS tablet Take 1 tablet (90 mg total) by mouth 2 (two) times daily. Discontinue on March 29, 2018. (Patient not taking: Reported on 04/15/2018) 22 tablet 0   No facility-administered medications prior to visit.      Allergies:   Patient has no known allergies.   Social History   Socioeconomic History  . Marital status: Single    Spouse name: Not on file  . Number of  children: Not on file  . Years of education: Not on file  . Highest education level: Not on file  Occupational History  . 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 Financial resource strain: Not on file  . Food insecurity:    Worry: Not on file    Inability: Not on file  . Transportation needs:    Medical: Not on file    Non-medical: Not on file  Tobacco Use  . Smoking status: Former Smoker    Types: Cigars    Last attempt to quit: 03/29/2017    Years since quitting: 1.0  . Smokeless tobacco: Never Used  . Tobacco comment: cigar  Substance and Sexual Activity  . Alcohol use: No  . Drug use: No  . Sexual  activity: Not on file  Lifestyle  . Physical activity:    Days per week: Not on file    Minutes per session: Not on file  . Stress: Not on file  Relationships  . Social connections:    Talks on phone: Not on file    Gets together: Not on file    Attends religious service: Not on file    Active member of club or organization: Not on file    Attends meetings of clubs or organizations: Not on file    Relationship status: Not on file  Other Topics Concern  . Not on file  Social History Narrative  . Not on file     Family History:  The patient's family history includes Alzheimer's disease in his mother; CAD in his father; Heart failure in his father.   ROS:   Please see the history of present illness.    ROS All other systems reviewed and are negative.   PHYSICAL EXAM:   VS:  BP 126/84   Pulse 72   Ht 5\' 10"  (1.778 m)   Wt 183 lb 12.8 oz (83.4 kg)   BMI 26.37 kg/m  GENERAL:  Well appearing WM in NAD HEENT:  PERRL, EOMI, sclera are clear. Oropharynx is clear. NECK:  No jugular venous distention, carotid upstroke brisk and symmetric, no bruits, no thyromegaly or adenopathy LUNGS:  Clear to auscultation bilaterally CHEST:  Unremarkable HEART:  RRR,  PMI not displaced or sustained,S1 and S2 within normal limits, no S3, no S4: no  clicks, no rubs, no murmurs ABD:  Soft, nontender. BS +, no masses or bruits. No hepatomegaly, no splenomegaly EXT:  2 + pulses throughout, no edema, no cyanosis no clubbing SKIN:  Warm and dry.  No rashes NEURO:  Alert and oriented x 3. Cranial nerves II through XII intact. PSYCH:  Cognitively intact      Wt Readings from Last 3 Encounters:  04/15/18 183 lb 12.8 oz (83.4 kg)  12/30/17 179 lb (81.2 kg)  07/09/17 185 lb 12.8 oz (84.3 kg)      Studies/Labs Reviewed:   EKG:  EKG is ordered today.  NSR with normal Ecg. I have personally reviewed and interpreted this study.   Recent Labs: 05/07/2017: B Natriuretic Peptide 78.9 12/30/2017: ALT 35; BUN 16; Creatinine, Ser 0.87; Hemoglobin 14.3; Platelets 204; Potassium 4.4; Sodium 142   Lipid Panel    Component Value Date/Time   CHOL 101 12/30/2017 0958   TRIG 92 12/30/2017 0958   HDL 38 (L) 12/30/2017 0958   CHOLHDL 6.0 03/30/2017 0251   VLDL 38 03/30/2017 0251   LDLCALC 45 12/30/2017 0958   Labs dated 06/03/17: CDRP 0.48, A1c 6% Cholesterol 91, triglycerides 64, HDL 34, LDL 44.   Additional studies/ records that were reviewed today include:   Cath 03/29/2017 Conclusion     Prox LAD lesion is 100% stenosed.  Post intervention, there is a 0% residual stenosis.  A drug-eluting stent was successfully placed using a STENT SYNERGY DES 3.5X16.  The left ventricular systolic function is normal.  LV end diastolic pressure is normal.  The left ventricular ejection fraction is 45-50% by visual estimate.  1. Single vessel occlusive CAD- 100% mid LAD with collaterals 2. Mild LV dysfunction. EF 45% 3. Normal LVEDP 4. Successful aspiration thrombectomy and stenting of the mid LAD with DES   Plan: DAPT with ASA and Brilinta. IV Aggrastat for 18 hours. May be a candidate for DC Sunday if no complications. High dose  statin.    Echo 03/30/2017 LV EF: 40% - 45%  Study Conclusions  - Left ventricle: Septal and  apical akinesis inferior wall hypokinesis The cavity size was mildly dilated. Wall thickness was increased in a pattern of mild LVH. Systolic function was mildly to moderately reduced. The estimated ejection fraction was in the range of 40% to 45%. Left ventricular diastolic function parameters were normal. - Atrial septum: No defect or patent foramen ovale was identified.   Cath 04/10/2017 Conclusion     Previously placed Prox LAD stent (unknown type) is widely patent.  LV end diastolic pressure is normal.   1. No significant obstructive CAD. The stent in the LAD is widely patent. 2. Normal LVEDP  Plan: continue medical therapy.      ASSESSMENT:    1. Coronary artery disease involving native coronary artery of native heart with angina pectoris ([facility])   2. Hyperlipidemia, unspecified hyperlipidemia type      PLAN:  In order of problems listed above:   1. CAD: NSTEMI with DES of the proximal LAD in January 2019.  Repeat cardiac catheterization near the end of January 2019 showed patent stent. CPX in follow up last summer was normal. Overall he is doing very well. Participating in [employer]. Now only on ASA and statin. He does plan on going to [place] and staying at the [facility]. We discussed the importance of acclimatizing to altitude but there is still some risk of high altitude sickness. He also doesn't have a way to continue CPAP and hypoxia with sleep apnea may be magnified at high altitude.   2. Hyperlipidemia: excellent control on statin. Will repeat labs today.  3. Obstructive sleep apnea: Managed by [name]. On CPAP    Medication Adjustments/Labs and Tests Ordered: Current medicines are reviewed at length with the patient today.  Concerns regarding medicines are outlined above.  Medication changes, Labs and Tests ordered today are listed in the Patient Instructions below. There are no Patient Instructions on file for this visit.    Signed, Gerren Hoffmeier [place], MD  04/15/2018 2:48 [facility] [place], [place], [place]  [address] Phone: [phone]; Fax: [phone]

## 2018-05-06 ENCOUNTER — Other Ambulatory Visit: Payer: Self-pay | Admitting: Physician Assistant

## 2018-05-08 DIAGNOSIS — J101 Influenza due to other identified influenza virus with other respiratory manifestations: Secondary | ICD-10-CM | POA: Diagnosis not present

## 2018-05-08 DIAGNOSIS — R05 Cough: Secondary | ICD-10-CM | POA: Diagnosis not present

## 2018-05-16 DIAGNOSIS — M79675 Pain in left toe(s): Secondary | ICD-10-CM | POA: Diagnosis not present

## 2018-05-16 DIAGNOSIS — M109 Gout, unspecified: Secondary | ICD-10-CM | POA: Diagnosis not present

## 2018-05-17 DIAGNOSIS — R2242 Localized swelling, mass and lump, left lower limb: Secondary | ICD-10-CM | POA: Diagnosis not present

## 2018-05-17 DIAGNOSIS — M109 Gout, unspecified: Secondary | ICD-10-CM | POA: Diagnosis not present

## 2018-05-31 ENCOUNTER — Other Ambulatory Visit: Payer: Self-pay | Admitting: Cardiology

## 2018-06-01 DIAGNOSIS — M109 Gout, unspecified: Secondary | ICD-10-CM | POA: Diagnosis not present

## 2018-06-02 ENCOUNTER — Telehealth: Payer: Self-pay | Admitting: Cardiology

## 2018-06-02 NOTE — Telephone Encounter (Signed)
He is ok to use his prednisone taper, there are no contraindications with his current medications. Stopping Brilinta should not have caused a gout flare.

## 2018-06-02 NOTE — Telephone Encounter (Signed)
Attempted to call the pt and wife back, about medication concern. Advised them to return a call back to the office.

## 2018-06-02 NOTE — Telephone Encounter (Signed)
Returned call to patient's wife no answer.LMTC. 

## 2018-06-02 NOTE — Telephone Encounter (Signed)
Patient's wife called he has gout in his foot. They prescribed him prednisone she wants to know if it's ok for him to take.

## 2018-06-02 NOTE — Telephone Encounter (Signed)
Pts wife (on [place]) is calling to report to [name] and covering nurse that the pt has been having  flare-up's of gout, since mid January, but the flare-ups have occurred since 05/16/18.   Wife states he went to his PCP where they have administered his 1st shot of Decadron in the affected foot (wife unknown which foot it is), and gave him an oral prescription of Prednisone taper--taper wife read off was for the pt to take the 5 mg tablets but take 6 tabs the first day, 5 tabs the 2nd day, 4 tabs the 3rd day, 3 tabs the 2nd day, then take 2 tabs the following day, and then finish his very last day/dose with 1 tab. Wife would like for Dr Martinique or our Pharmacist to advise if its safe from a cardiac perspective and with his other cardiac meds, to start taking the oral prednisone taper med prescribed.  Wife also states that the pt took his last dose of Brilinta on 04/12/18, and ever since this med was stopped, she feels that's about when his flare-up of gout started.  Wife wants to know if this has any relation at all?  Wife states that the pt follows a strict diet and plant based regimen, to prevent gout, and this has worked all the way up, until he finished his last dose of Brilinta.  Informed the pts wife that I will route this message to Dr Martin[place], his Nurse, and our Pharmacist for further review and recommendation, and someone will follow-up with the pts wife thereafter.  Wife verbalized understanding and agrees with this plan.

## 2018-06-03 NOTE — Telephone Encounter (Signed)
Spoke to patient's wife our pharmacist Megan's advice given.Dr.Jordan agreed with advice.

## 2018-06-03 NOTE — Telephone Encounter (Signed)
Patient's spouse returned your call.  °

## 2018-06-03 NOTE — Telephone Encounter (Signed)
Agree- prednisone taper is OK. Gout has nothing to do with Brilinta.   Blondina Coderre [place] MD, [facility]

## 2018-06-04 DIAGNOSIS — M109 Gout, unspecified: Secondary | ICD-10-CM | POA: Diagnosis not present

## 2018-06-26 ENCOUNTER — Other Ambulatory Visit: Payer: Self-pay

## 2018-06-26 MED ORDER — COLCHICINE 0.6 MG PO TABS: 0.6000 mg | ORAL_TABLET | Freq: Two times a day (BID) | ORAL | 1 refills | Status: DC

## 2018-07-03 DIAGNOSIS — G4733 Obstructive sleep apnea (adult) (pediatric): Secondary | ICD-10-CM | POA: Diagnosis not present

## 2018-08-07 DIAGNOSIS — T7840XA Allergy, unspecified, initial encounter: Secondary | ICD-10-CM | POA: Diagnosis not present

## 2018-08-07 DIAGNOSIS — J029 Acute pharyngitis, unspecified: Secondary | ICD-10-CM | POA: Diagnosis not present

## 2018-08-14 DIAGNOSIS — Z85828 Personal history of other malignant neoplasm of skin: Secondary | ICD-10-CM | POA: Diagnosis not present

## 2018-08-14 DIAGNOSIS — L821 Other seborrheic keratosis: Secondary | ICD-10-CM | POA: Diagnosis not present

## 2018-08-14 DIAGNOSIS — D3617 Benign neoplasm of peripheral nerves and autonomic nervous system of trunk, unspecified: Secondary | ICD-10-CM | POA: Diagnosis not present

## 2018-08-14 DIAGNOSIS — L570 Actinic keratosis: Secondary | ICD-10-CM | POA: Diagnosis not present

## 2018-08-14 DIAGNOSIS — D485 Neoplasm of uncertain behavior of skin: Secondary | ICD-10-CM | POA: Diagnosis not present

## 2018-08-14 DIAGNOSIS — C44519 Basal cell carcinoma of skin of other part of trunk: Secondary | ICD-10-CM | POA: Diagnosis not present

## 2018-08-27 IMAGING — CR DG CHEST 2V
2 series · 2 of 2 positions shown · non-contrast
Comparison: 03/29/2017

CLINICAL DATA: Chest pain, shortness of Breath

EXAM:
CHEST  2 VIEW

[chest lat]
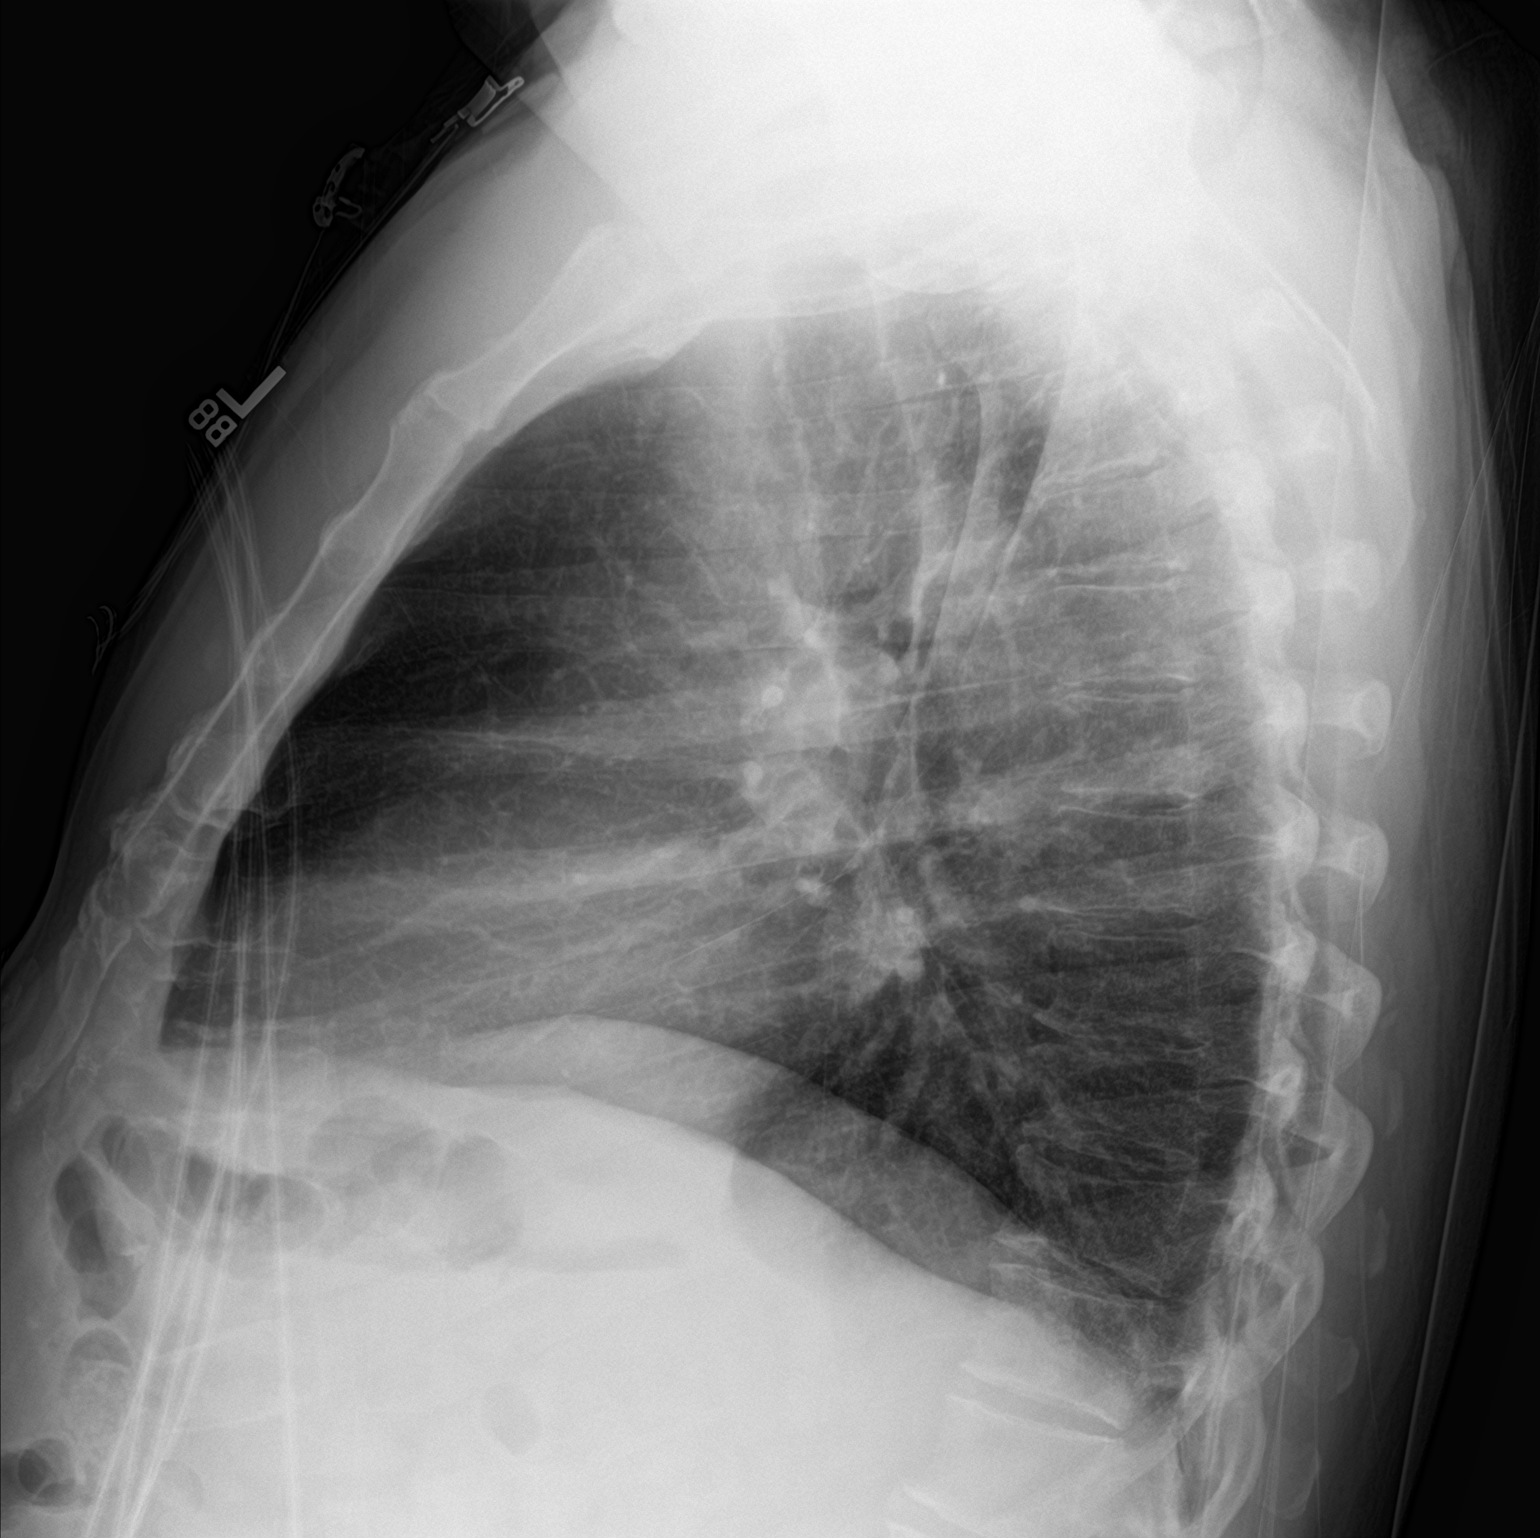

[chest ap]
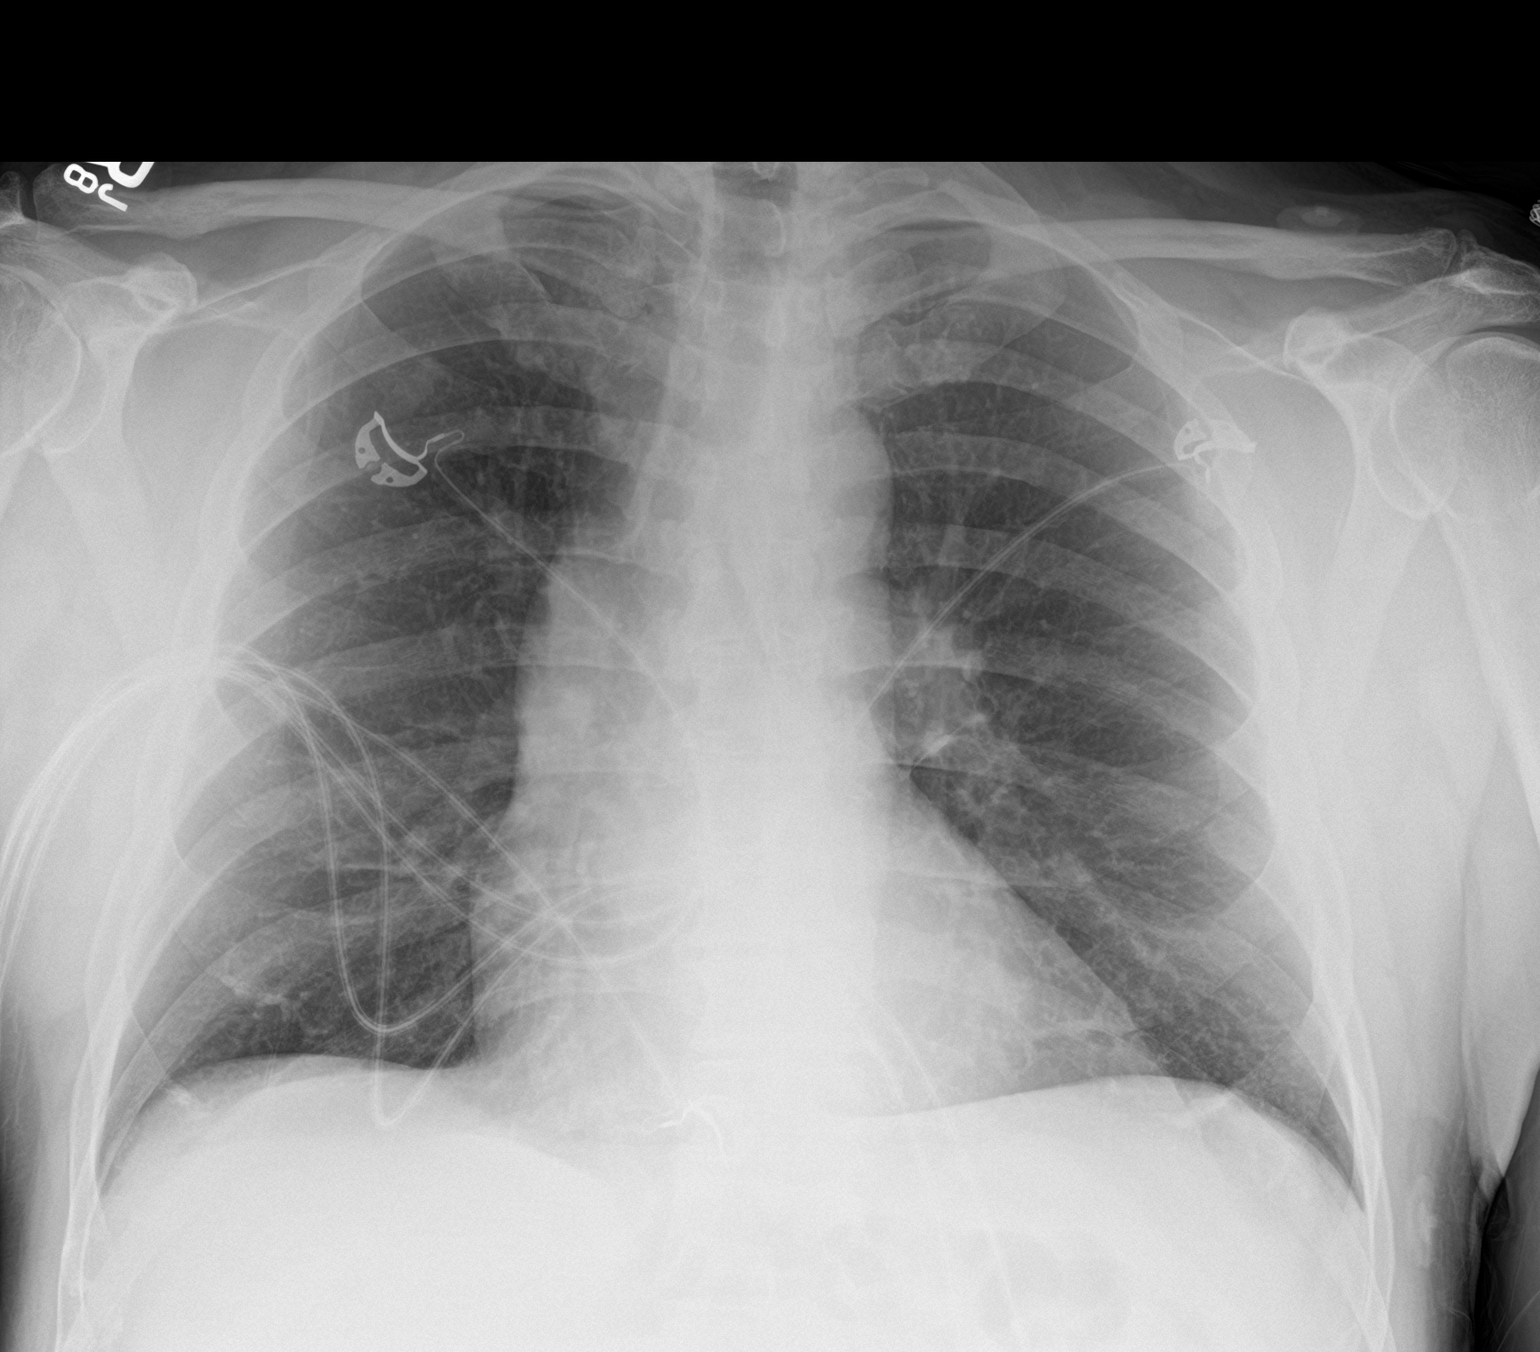

[2 of 2 positions shown; findings below may reference images not displayed]

FINDINGS: Heart is upper limits normal in size. Lungs are clear. No effusions
or acute bony abnormality.
IMPRESSION: No active cardiopulmonary disease.

## 2018-08-28 ENCOUNTER — Other Ambulatory Visit: Payer: Self-pay | Admitting: Cardiology

## 2018-09-24 IMAGING — DX DG CHEST 2V
2 series · 2 of 2 positions shown · non-contrast
Comparison: 04/10/2017

CLINICAL DATA: Recent MI in [REDACTED], post stent placement,
occasional chest pain since MI, mid chest discomfort with neck pain
and diaphoresis today, some relief with nitroglycerin, history CHF,
former smoker

EXAM:
CHEST  2 VIEW

[chest pa]
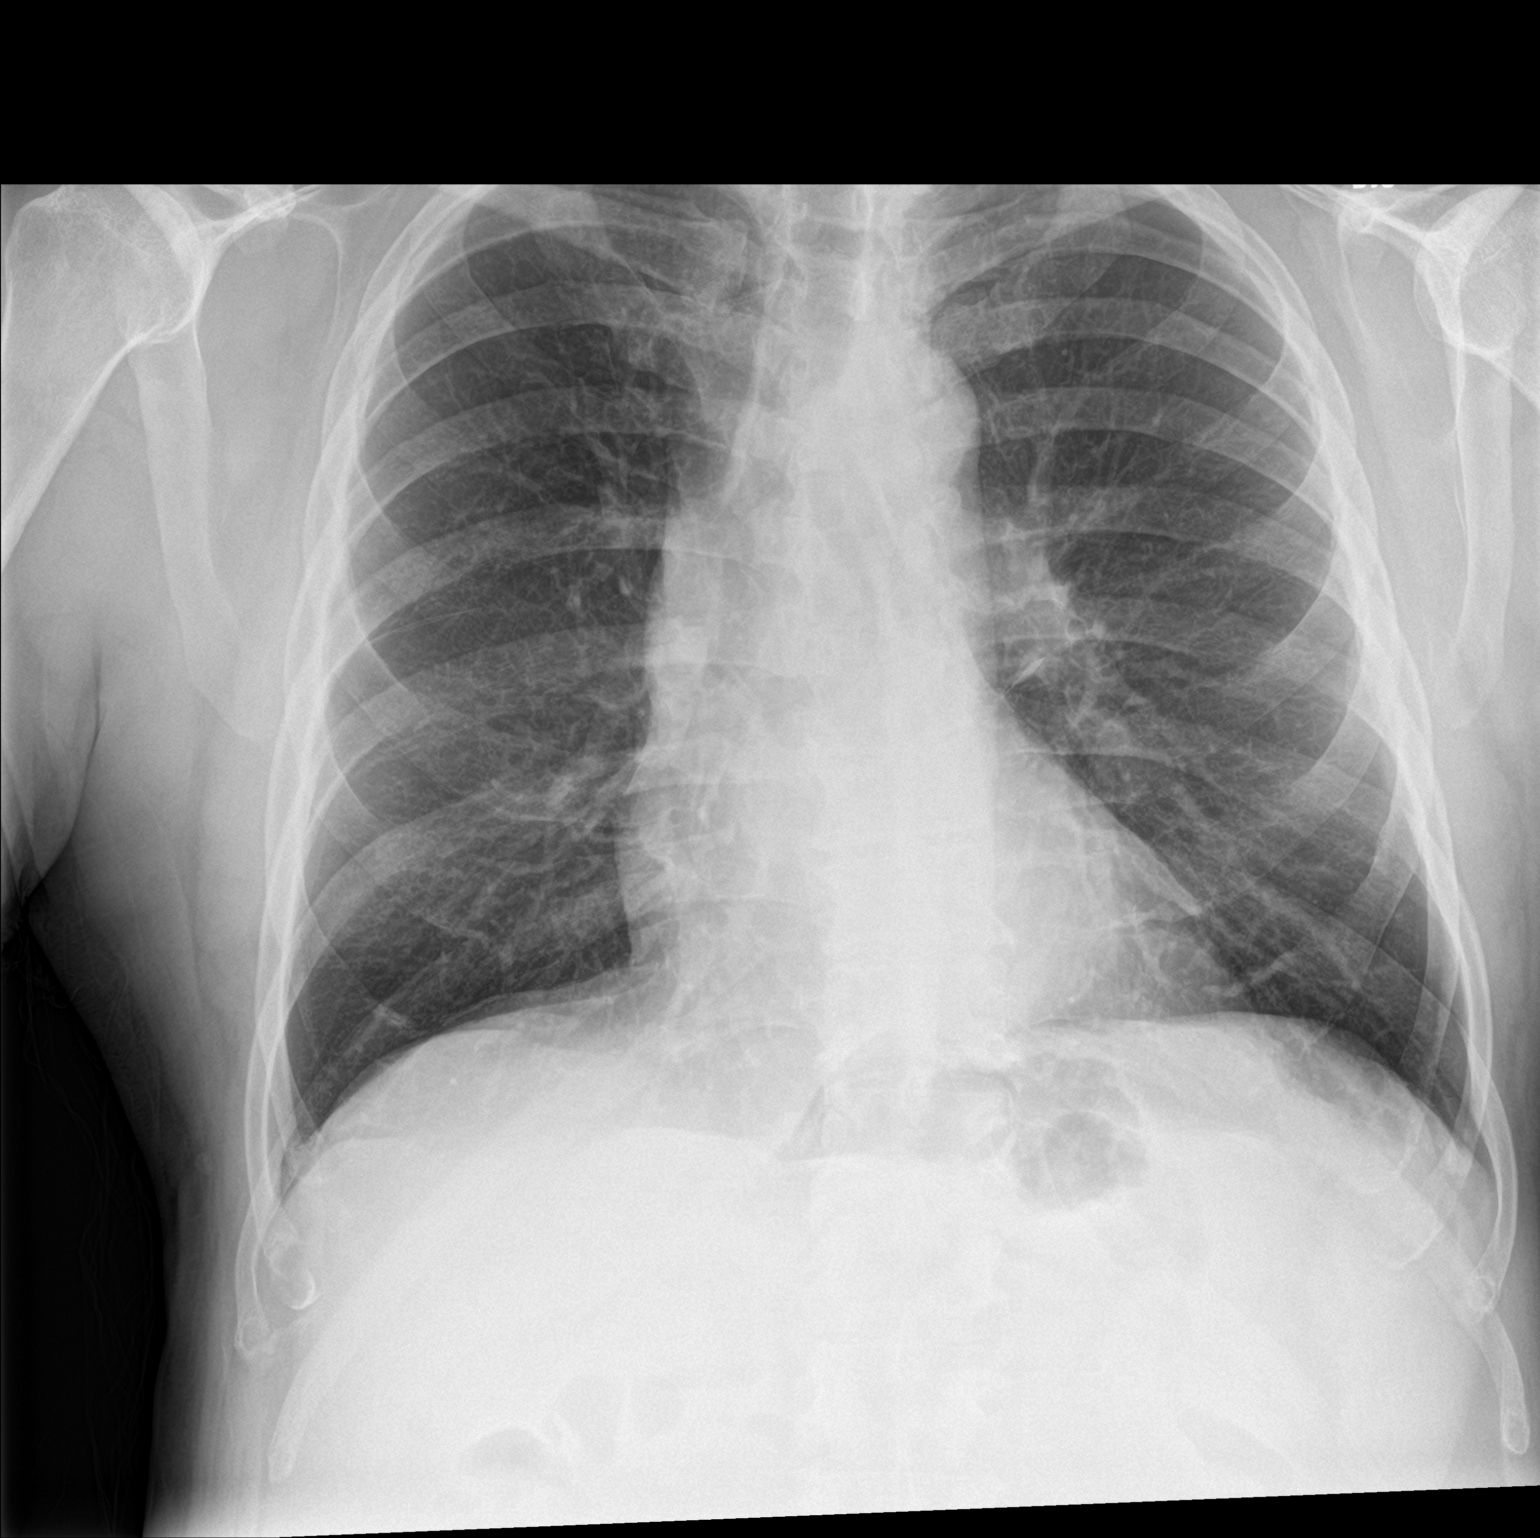

[chest lat]
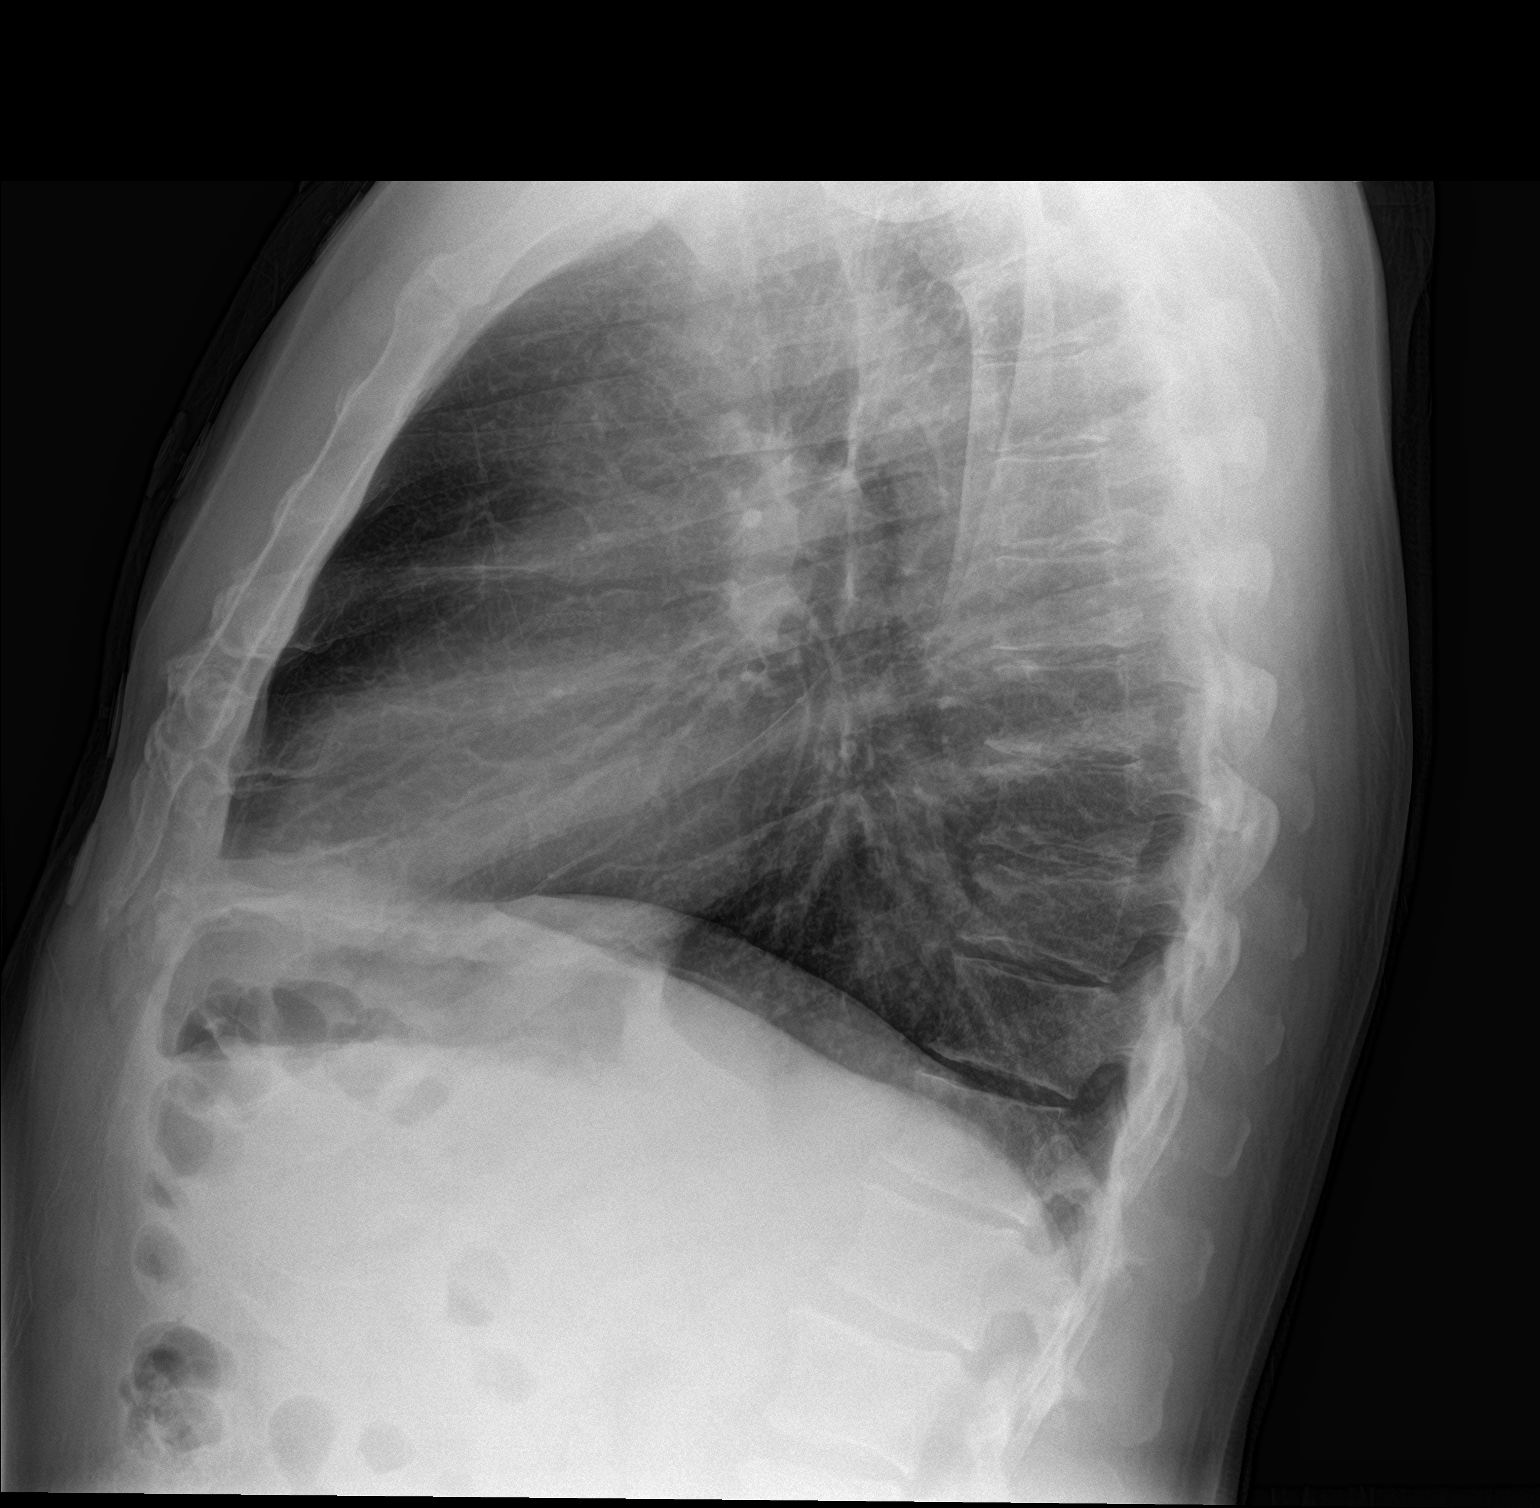

[2 of 2 positions shown; findings below may reference images not displayed]

FINDINGS: Normal heart size, mediastinal contours, and pulmonary vascularity.

Lungs clear.

No acute infiltrate, pleural effusion or pneumothorax.

Bones unremarkable.
IMPRESSION: No acute abnormalities.

## 2018-10-29 DIAGNOSIS — Z23 Encounter for immunization: Secondary | ICD-10-CM | POA: Diagnosis not present

## 2018-12-05 ENCOUNTER — Encounter

## 2018-12-25 DIAGNOSIS — B009 Herpesviral infection, unspecified: Secondary | ICD-10-CM | POA: Diagnosis not present

## 2019-01-08 NOTE — Progress Notes (Signed)
Cardiology Office Note    Date:  01/12/2019   ID:  James [name], DOB 09/08/1960, MRN [id number]  PCP:  [name], MD  Cardiologist:  [name]   Chief Complaint  Patient presents with  . Coronary Artery Disease    History of Present Illness:  James [name] is a 58 y.o. male with PMH of basal cell skin CA, polycythemia and hepatitis C presented with NSTEMI on 03/29/2017.  Troponin on a peak at 45.54.  Hemoglobin A1c 5.5. Total cholesterol 155, HDL 26, LDL 91, triglyceride 191.  Urgent cardiac catheterization on the same day showed 100% proximal LAD lesion treated with thrombectomy and 3.5 x 16 mm Synergy DES, EF 45%.  Postprocedure, he was on IV Aggrastat for 18 hours.  He was also placed on aspirin, Brilinta and high-dose statin.    He was seen by Almyra Deforest PA-C on 04/09/2017 for post-cath follow-up.  He denied any chest pain at the time.  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  He was having a lot of anxiety issues.  He was also referred  to [name] for management of obstructive sleep apnea.  He could not tolerate full facial mask in the past, however I think he can tolerate nasal device instead.  The  patient then presented to the hospital with chest pain on the following morning after the office visit.  Troponin was initially elevated and peaked at 2.7.  He underwent cardiac catheterization, however this showed his LAD stent was widely patent, there is mild jailing of the diagonal artery.  Retrospective, it was unclear if patient had transient partial stent thrombosis or coronary spasm at the end of the stent.  Echocardiogram was repeated which showed EF 55-60%, severe hypokinesis of the akinesis of mid anteroseptal and apical anterior wall, moderate LVH.  Ejection fraction has improved from the previous 40-45% on echo.  Low-dose nitrates were added, patient was  eventually discharged on 04/11/2017.  He was admitted in February 2019 again with atypical chest and epigastric pain. He ruled out for MI. Seen by our service and pain was felt to be noncardiac.   He has also  been seen by a Cardiologist in [place]- Dr. [name]. Lisinopril discontinued due to dizziness and HA. Later nitrates also reduced and later discontinued. He underwent a CPX on 07/05/17 and although results are not available in [place] the patient reports he did very well.   On follow up today he is doing very well. No cardiac symptoms in over 4 months. He  is still  following the [employer] program with diet and meditation. He is using CPAP. He exercises regularly riding his bike 8 miles daily, lifting weights, playing golf.  He notes improvement in his breathing. No chest pain.    Past Medical History:  Diagnosis Date  . Acute systolic congestive heart failure ([place])   . Anemia    slight anemia with hepatitis tx  . Arthritis    oa  . Cancer ([place]) 4 months ago   basal cell skin cancer removed from chest area  . GERD (gastroesophageal reflux disease)   . Hepatitis C 04/29/2012   had savalta treatment 2 years ago, now cured  . Hyperlipidemia   . NSTEMI (non-ST elevated myocardial infarction) ([place]) 03/29/2017   1/19 PCI/DESx1 to mLAD, EF 45%  . Palpitations  at times     Past Surgical History:  Procedure Laterality Date  . ARTHROSCOPIC REPAIR ACL Right 15 years ago  . CORONARY STENT INTERVENTION N/A 03/29/2017   Procedure: CORONARY STENT INTERVENTION;  Surgeon: Martinique, Jalyne Brodzinski M, MD;  Location: [facility];  Service: Cardiovascular;  Laterality: N/A;  . CORONARY THROMBECTOMY N/A 03/29/2017   Procedure: Coronary Thrombectomy;  Surgeon: Martinique, Ernestene Coover M, MD;  Location: [facility];  Service: Cardiovascular;  Laterality: N/A;  . LEFT HEART CATH AND CORONARY ANGIOGRAPHY N/A 03/29/2017   Procedure: LEFT HEART CATH AND CORONARY ANGIOGRAPHY;  Surgeon: Martinique,  Amya Hlad M, MD;  Location: [facility];  Service: Cardiovascular;  Laterality: N/A;  . LEFT HEART CATH AND CORONARY ANGIOGRAPHY N/A 04/10/2017   Procedure: LEFT HEART CATH AND CORONARY ANGIOGRAPHY;  Surgeon: Martinique, Jeralyn Nolden M, MD;  Location: [facility];  Service: Cardiovascular;  Laterality: N/A;  . TOTAL HIP ARTHROPLASTY Right 01/11/2015   Procedure: RIGHT TOTAL HIP ARTHROPLASTY ANTERIOR APPROACH;  Surgeon: Paralee Cancel, MD;  Location: [facility];  Service: Orthopedics;  Laterality: Right;    Current Medications: Outpatient Medications Prior to Visit  Medication Sig Dispense Refill  . aspirin 81 MG EC tablet TAKE 1 TABLET BY MOUTH EVERY DAY 90 tablet 3  . atorvastatin (LIPITOR) 80 MG tablet TAKE 1 TABLET (80 MG TOTAL) BY MOUTH DAILY AT 6 PM. 90 tablet 2  . colchicine 0.6 MG tablet Take 1 tablet (0.6 mg total) by mouth 2 (two) times daily. 90 tablet 1  . Multiple Vitamin (MULTIVITAMIN) tablet Take 1 tablet by mouth daily.    . nitroGLYCERIN (NITROSTAT) 0.4 MG SL tablet PLACE 1 TABLET UNDER THE TONGUE EVERY 5 MINUTES IF NEEDED 75 tablet 3  . valACYclovir (VALTREX) 500 MG tablet Take 1 tablet by mouth daily as needed.     No facility-administered medications prior to visit.      Allergies:   Patient has no known allergies.   Social History   Socioeconomic History  . Marital status: Single    Spouse name: Not on file  . Number of children: Not on file  . Years of education: Not on file  . Highest education level: Not on file  Occupational History  . 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 Financial resource strain: Not on file  . Food insecurity    Worry: Not on file    Inability: Not on file  . Transportation needs    Medical: Not on file    Non-medical: Not on file  Tobacco Use  . Smoking status: Former Smoker    Types: Cigars    Quit date: 03/29/2017    Years since quitting: 1.7  . Smokeless tobacco: Never Used  . Tobacco  comment: cigar  Substance and Sexual Activity  . Alcohol use: No  . Drug use: No  . Sexual activity: Not on file  Lifestyle  . Physical activity    Days per week: Not on file    Minutes per session: Not on file  . Stress: Not on file  Relationships  . Social Herbalist on phone: Not on file    Gets together: Not on file    Attends religious service: Not on file    Active member of club or organization: Not on file    Attends meetings of clubs or organizations: Not on file    Relationship status: Not on file  Other Topics Concern  . Not on file  Social History Narrative  . Not on file     Family History:  The patient's family history includes Alzheimer's disease in his mother; CAD in his father; Heart failure in his father.   ROS:   Please see the history of present illness.    ROS All other systems reviewed and are negative.   PHYSICAL EXAM:   VS:  BP 130/78   Pulse 74   Ht 5\' 10"  (1.778 m)   Wt 180 lb (81.6 kg)   SpO2 96%   BMI 25.83 kg/m  GENERAL:  Well appearing WM in NAD HEENT:  PERRL, EOMI, sclera are clear. Oropharynx is clear. NECK:  No jugular venous distention, carotid upstroke brisk and symmetric, no bruits, no thyromegaly or adenopathy LUNGS:  Clear to auscultation bilaterally CHEST:  Unremarkable HEART:  RRR,  PMI not displaced or sustained,S1 and S2 within normal limits, no S3, no S4: no clicks, no rubs, no murmurs ABD:  Soft, nontender. BS +, no masses or bruits. No hepatomegaly, no splenomegaly EXT:  2 + pulses throughout, no edema, no cyanosis no clubbing SKIN:  Warm and dry.  No rashes NEURO:  Alert and oriented x 3. Cranial nerves II through XII intact. PSYCH:  Cognitively intact   Wt Readings from Last 3 Encounters:  01/12/19 180 lb (81.6 kg)  04/15/18 183 lb 12.8 oz (83.4 kg)  12/30/17 179 lb (81.2 kg)      Studies/Labs Reviewed:   EKG:  EKG is not ordered today.     Recent Labs: No results found for requested labs within  last 8760 hours.   Lipid Panel    Component Value Date/Time   CHOL 101 12/30/2017 0958   TRIG 92 12/30/2017 0958   HDL 38 (L) 12/30/2017 0958   CHOLHDL 6.0 03/30/2017 0251   VLDL 38 03/30/2017 0251   LDLCALC 45 12/30/2017 0958   Labs dated 06/03/17: CDRP 0.48, A1c 6% Cholesterol 91, triglycerides 64, HDL 34, LDL 44.   Additional studies/ records that were reviewed today include:   Cath 03/29/2017 Conclusion     Prox LAD lesion is 100% stenosed.  Post intervention, there is a 0% residual stenosis.  A drug-eluting stent was successfully placed using a STENT SYNERGY DES 3.5X16.  The left ventricular systolic function is normal.  LV end diastolic pressure is normal.  The left ventricular ejection fraction is 45-50% by visual estimate.  1. Single vessel occlusive CAD- 100% mid LAD with collaterals 2. Mild LV dysfunction. EF 45% 3. Normal LVEDP 4. Successful aspiration thrombectomy and stenting of the mid LAD with DES   Plan: DAPT with ASA and Brilinta. IV Aggrastat for 18 hours. May be a candidate for DC Sunday if no complications. High dose statin.    Echo 03/30/2017 LV EF: 40% - 45%  Study Conclusions  - Left ventricle: Septal and apical akinesis inferior wall hypokinesis The cavity size was mildly dilated. Wall thickness was increased in a pattern of mild LVH. Systolic function was mildly to moderately reduced. The estimated ejection fraction was in the range of 40% to 45%. Left ventricular diastolic function parameters were normal. - Atrial septum: No defect or patent foramen ovale was identified.   Cath 04/10/2017 Conclusion     Previously placed Prox LAD stent (unknown type) is widely patent.  LV end diastolic pressure is normal.   1. No significant obstructive CAD. The stent in the LAD is widely patent. 2. Normal LVEDP  Plan: continue medical therapy.      ASSESSMENT:    1. Coronary artery disease involving native coronary  artery of native heart with angina pectoris ([place])   2. Hyperlipidemia, unspecified hyperlipidemia type   3. OSA (obstructive sleep apnea)      PLAN:  In order of problems listed above:   1. CAD: NSTEMI with DES of the proximal LAD in January 2019.  Repeat cardiac catheterization near the end of January 2019 showed patent stent. CPX in follow up last summer was normal. Overall he is doing very well.  Now only on ASA and statin.  2. Hyperlipidemia: excellent control on statin. Will update labs.  3. Obstructive sleep apnea: Managed by [name]. On CPAP    Medication Adjustments/Labs and Tests Ordered: Current medicines are reviewed at length with the patient today.  Concerns regarding medicines are outlined above.  Medication changes, Labs and Tests ordered today are listed in the Patient Instructions below. There are no Patient Instructions on file for this visit.   Signed, Naomy Esham Martinique, MD  01/12/2019 4:38 PM    [facility] [address], [place], [place]  [address] Phone: [phone]; Fax: [phone]

## 2019-01-12 ENCOUNTER — Telehealth: Payer: Self-pay | Admitting: Cardiology

## 2019-01-12 ENCOUNTER — Other Ambulatory Visit: Payer: Self-pay

## 2019-01-12 ENCOUNTER — Ambulatory Visit (INDEPENDENT_AMBULATORY_CARE_PROVIDER_SITE_OTHER): Payer: BC Managed Care – PPO | Admitting: Cardiology

## 2019-01-12 ENCOUNTER — Encounter: Payer: Self-pay | Admitting: Cardiology

## 2019-01-12 VITALS — BP 130/78 | HR 74 | Ht 70.0 in | Wt 180.0 lb

## 2019-01-12 DIAGNOSIS — I25119 Atherosclerotic heart disease of native coronary artery with unspecified angina pectoris: Secondary | ICD-10-CM

## 2019-01-12 DIAGNOSIS — G4733 Obstructive sleep apnea (adult) (pediatric): Secondary | ICD-10-CM | POA: Diagnosis not present

## 2019-01-12 DIAGNOSIS — E785 Hyperlipidemia, unspecified: Secondary | ICD-10-CM

## 2019-01-12 DIAGNOSIS — Z23 Encounter for immunization: Secondary | ICD-10-CM

## 2019-01-12 NOTE — Patient Instructions (Signed)
Medication Instructions:  Continue same medications *If you need a refill on your cardiac medications before your next appointment, please call your pharmacy*  Lab Work: Bmet,lipid and hepatic panels,a1c this week Lab order enclosed  Testing/Procedures: None ordered  Follow-Up: At [facility], you and your health needs are our priority.  As part of our continuing mission to provide you with exceptional heart care, we have created designated Provider Care Teams.  These Care Teams include your primary Cardiologist (physician) and Advanced Practice Providers (APPs -  Physician Assistants and Nurse Practitioners) who all work together to provide you with the care you need, when you need it.  Your next appointment:  6 months   Call in Feb to schedule May appointment   The format for your next appointment:  Office   Provider:  Dr.Jordan

## 2019-01-12 NOTE — Telephone Encounter (Signed)
Spoke to patient's wife advised ok to come back with husband at his appointment with Dr.Jordan this afternoon.

## 2019-01-12 NOTE — Addendum Note (Signed)
Addended by: Kathyrn Lass on: 01/12/2019 04:45 PM   Modules accepted: Orders

## 2019-01-12 NOTE — Telephone Encounter (Signed)
° ° °  Spouse requesting to accompany patient during appointment.

## 2019-01-15 DIAGNOSIS — I25119 Atherosclerotic heart disease of native coronary artery with unspecified angina pectoris: Secondary | ICD-10-CM | POA: Diagnosis not present

## 2019-01-15 DIAGNOSIS — G4733 Obstructive sleep apnea (adult) (pediatric): Secondary | ICD-10-CM | POA: Diagnosis not present

## 2019-01-15 DIAGNOSIS — E785 Hyperlipidemia, unspecified: Secondary | ICD-10-CM | POA: Diagnosis not present

## 2019-01-16 LAB — BASIC METABOLIC PANEL
BUN/Creatinine Ratio: 20 (ref 10–24)
BUN: 16 mg/dL (ref 8–27)
CO2: 22 mmol/L (ref 20–29)
Calcium: 10 mg/dL (ref 8.6–10.2)
Chloride: 103 mmol/L (ref 96–106)
Creatinine, Ser: 0.81 mg/dL (ref 0.76–1.27)
GFR calc Af Amer: 112 mL/min/{1.73_m2} (ref >59)
GFR calc non Af Amer: 97 mL/min/{1.73_m2} (ref >59)
Glucose: 96 mg/dL (ref 65–99)
Potassium: 4.3 mmol/L (ref 3.5–5.2)
Sodium: 140 mmol/L (ref 134–144)

## 2019-01-16 LAB — HEMOGLOBIN A1C
Est. average glucose Bld gHb Est-mCnc: 105 mg/dL
Hgb A1c MFr Bld: 5.3 % (ref 4.8–5.6)

## 2019-01-16 LAB — LIPID PANEL
Chol/HDL Ratio: 2.6 ratio (ref 0.0–5.0)
Cholesterol, Total: 103 mg/dL (ref 100–199)
HDL: 40 mg/dL (ref >39)
LDL Chol Calc (NIH): 46 mg/dL (ref 0–99)
Triglycerides: 87 mg/dL (ref 0–149)
VLDL Cholesterol Cal: 17 mg/dL (ref 5–40)

## 2019-01-16 LAB — HEPATIC FUNCTION PANEL
ALT: 31 IU/L (ref 0–44)
AST: 27 IU/L (ref 0–40)
Albumin: 4.5 g/dL (ref 3.8–4.9)
Alkaline Phosphatase: 93 IU/L (ref 39–117)
Bilirubin Total: 0.5 mg/dL (ref 0.0–1.2)
Bilirubin, Direct: 0.16 mg/dL (ref 0.00–0.40)
Total Protein: 7 g/dL (ref 6.0–8.5)

## 2019-01-21 DIAGNOSIS — Z23 Encounter for immunization: Secondary | ICD-10-CM | POA: Diagnosis not present

## 2019-02-18 ENCOUNTER — Telehealth: Payer: Self-pay | Admitting: Cardiology

## 2019-02-18 NOTE — Telephone Encounter (Signed)
Spoke to patient's wife Dr.Jordan's advice given. 

## 2019-02-18 NOTE — Telephone Encounter (Signed)
New message:    Patient wife calling stating that her husband need a note for going to the gym. Please call patient wife back.

## 2019-02-18 NOTE — Telephone Encounter (Signed)
Spoke to wife. She states the patient will like a note stating he can exercise at  agym without a mask. RN informed  Wife the answer will probably be no but will be glad to send to Dr Martinique for his answer.   Office will contact her with Dr Martinique answer. She verbalized understanding

## 2019-02-18 NOTE — Telephone Encounter (Signed)
He should be able to wear a mask so I cannot fulfill his request.   Morrell Fluke Martinique MD, [facility]

## 2019-06-23 ENCOUNTER — Telehealth: Payer: Self-pay | Admitting: Cardiovascular Disease

## 2019-06-23 NOTE — Telephone Encounter (Signed)
Called and spoke with wife. Notified that it was fine for her to come to pt's appt tomorrow and for future appts. Wife verbalized understanding and had no other questions at this time.

## 2019-06-23 NOTE — Telephone Encounter (Signed)
Wife of patient wanted to make sure she would be able to come back with her Husband during his appointment with [name] and for all future appointments. He does have a slight hearing impairment and can not always hear what the doctor has to say

## 2019-06-23 NOTE — Telephone Encounter (Signed)
Message sent to primary nurse to advise

## 2019-06-24 ENCOUNTER — Ambulatory Visit (INDEPENDENT_AMBULATORY_CARE_PROVIDER_SITE_OTHER): Payer: BC Managed Care – PPO | Admitting: Cardiovascular Disease

## 2019-06-24 ENCOUNTER — Encounter: Payer: Self-pay | Admitting: Cardiovascular Disease

## 2019-06-24 ENCOUNTER — Other Ambulatory Visit: Payer: Self-pay

## 2019-06-24 DIAGNOSIS — E785 Hyperlipidemia, unspecified: Secondary | ICD-10-CM | POA: Diagnosis not present

## 2019-06-24 DIAGNOSIS — I214 Non-ST elevation (NSTEMI) myocardial infarction: Secondary | ICD-10-CM | POA: Diagnosis not present

## 2019-06-24 DIAGNOSIS — G4733 Obstructive sleep apnea (adult) (pediatric): Secondary | ICD-10-CM | POA: Diagnosis not present

## 2019-06-24 DIAGNOSIS — I2510 Atherosclerotic heart disease of native coronary artery without angina pectoris: Secondary | ICD-10-CM | POA: Diagnosis not present

## 2019-06-24 NOTE — Patient Instructions (Signed)
Medication Instructions:  CONTINUE WITH CURRENT MEDICATIONS. NO CHANGES.  *If you need a refill on your cardiac medications before your next appointment, please call your pharmacy*    Follow-Up: At [facility], you and your health needs are our priority.  As part of our continuing mission to provide you with exceptional heart care, we have created designated Provider Care Teams.  These Care Teams include your primary Cardiologist (physician) and Advanced Practice Providers (APPs -  Physician Assistants and Nurse Practitioners) who all work together to provide you with the care you need, when you need it.  We recommend signing up for the patient portal called "MyChart".  Sign up information is provided on this After Visit Summary.  MyChart is used to connect with patients for Virtual Visits (Telemedicine).  Patients are able to view lab/test results, encounter notes, upcoming appointments, etc.  Non-urgent messages can be sent to your provider as well.   To learn more about what you can do with MyChart, go to [email or web address].    Your next appointment:   12 month(s)  The format for your next appointment:   In Person  Provider:   Shelva Majestic, MD   Other Instructions [place] TO [place]

## 2019-06-24 NOTE — Progress Notes (Signed)
Cardiology Office Note    Date:  06/30/2019   ID:  TAGE FEGGINS, DOB 10/28/1960, MRN [id number]  PCP:  [name], MD  Cardiologist:  Shelva Majestic, MD   F/IU sleep evaluation.  History of Present Illness:  James [name] is a 59 y.o. male who is status post a recent PCI to a totally occluded LAD.  He was referred for sleep evaluation and I initially saw him in April 11, 2017.  I last saw him in April 2019.  He presents for follow-up evaluation.  Mr. James [name] has a history of obstructive sleep apnea which was initially diagnosed in May 2007 when he underwent a nocturnal polysomnogram which was interpreted by Dr. Annamaria Boots.  At that time, he had severe sleep apnea with an AHI of 45.9 per hour; random AHI was 43.1 per hour.  He had very loud snoring with oxygen desaturation to a nadir of 72%.  Apparently, he was never started on CPAP therapy, but at some point had an oral appliance customized which he had worn intermittently but had not used in some time.  He exercises frequently and works out regularly at sport time.  On January 18 while doing a cardiac workout.  He began to notice substernal chest tightness.  He saw his PCP and was told to take antacids.  [facility] laboratory was drawn and he was later notified that troponins were elevated.  He presented to the hospital and underwent cardiac catheterization by [name] on January 18 in the early evening and was found to have total mid LAD occlusion which was successfully stented.  He was seen on 04/09/2017 by Almyra Deforest in the office follow-up and at that time due to his poor sleep.  He was advised to undergo a sleep evaluation and was scheduled to see me today.  However, later that night he again experienced recurrent chest pain, was rehospitalized and underwent repeat cardiac catheterization yesterday which revealed a widely patent stent.  He was discharged early this morning and presented to the office for further evaluation with me  later that day.  When I saw him he was having anxiety some anxiety following his heart attack.  He is concerned that he needs to get his sleep apnea treated as soon as possible.  An echo Doppler study on 03/30/2017 showed an EF of 40-45%.  There was mild LVH.  There was septal and apical akinesis. Marland [name]  He admits to slowing awakening 1-2 times per night, nonrestorative sleep, and daytime sleepiness.  An Epworth Sleepiness Scale score was calculated in the office  which endorsed at 17 and was consistent with excessive daytime sleepiness as shown below:  Epworth Sleepiness Scale: Situation   Chance of Dozing/Sleeping (0 = never , 1 = slight chance , 2 = moderate chance , 3 = high chance )   sitting and reading 3   watching TV 3   sitting inactive in a public place 3   being a passenger in a motor vehicle for an hour or more 1   lying down in the afternoon 3   sitting and talking to someone 0   sitting quietly after lunch (no alcohol) 3   while stopped for a few minutes in traffic as the driver 1   Total Score  17    When I initially saw him, and I spent a considerable amount of time with him reviewing his hospitalization as well as his reason to reinitiate CPAP therapy.  Since  I saw him, we had numerous conversations with insurance company he would denied his follow-up sleep study.  Ultimately after long discussions the insurance company agreed for him to have AutoPap titration at home.  I saw him for follow-up evaluation in April 2021.  His set up date via [employer] as his DME company was May 16, 2017.  I reviewed a download from June 01, 2017 through June 30, 2017 which revealed that he did not start using CPAP therapy until April 9.  He used it every day for 13 days at the end of that cycle but had significant mask leak.  I then obtain a new download from April 1 through July 09, 2017 which again demonstrates 13 stretch where he used therapy but he has not used CPAP since April 21.  As result he  is not compliant presently with usage days at only 43% with average use is at 5 hours and 16 minutes.  He has been participating in a weight loss program and has lost 12 pounds.  He is not lifting for mass as he had in the past and he believes he is lost some of his pectoral size.  He believes the reduction of this may accommodate for his need for CPAP use.   Since his last visit with me 2 years ago, Mr. Sprung has spent half the time in [place] and half the time in [place].  [employer] is now his DME company.  He mitts to excellent compliance with CPAP and once he had gotten used to it has used it every night.  Download was obtained from April 23, 2019 through May 22, 2019.  He essentially used it all nights.  However sleep duration is suboptimal with median usage on days used at only 4 hours and 4 minutes.  He states that he does not sleep very long.  His AutoSet unit is set at a minimum pressure of 5 with maximum pressure of 20.  AHI 0.6.  His 95th percentile pressure is 11.7.  He is sleeping well.  He has had purposeful weight loss and admits to a 15 pound weight loss.  He has changed his diet and basically has a plant-based diet.  He continues to exercise regularly at the gym.  He denies any recurrent anginal symptomatology.  He continues to be on atorvastatin 80 mg for hyperlipidemia.  He is on aspirin.  He presents for evaluation.  Past Medical History:  Diagnosis Date  . Acute systolic congestive heart failure ([place])   . Anemia    slight anemia with hepatitis tx  . Arthritis    oa  . Cancer ([place]) 4 months ago   basal cell skin cancer removed from chest area  . GERD (gastroesophageal reflux disease)   . Hepatitis C 04/29/2012   had savalta treatment 2 years ago, now cured  . Hyperlipidemia   . NSTEMI (non-ST elevated myocardial infarction) ([place]) 03/29/2017   1/19 PCI/DESx1 to mLAD, EF 45%  . Palpitations    at times     Past Surgical History:  Procedure Laterality Date  .  ARTHROSCOPIC REPAIR ACL Right 15 years ago  . CORONARY STENT INTERVENTION N/A 03/29/2017   Procedure: CORONARY STENT INTERVENTION;  Surgeon: Martinique, Peter M, MD;  Location: [facility];  Service: Cardiovascular;  Laterality: N/A;  . CORONARY THROMBECTOMY N/A 03/29/2017   Procedure: Coronary Thrombectomy;  Surgeon: Martinique, Peter M, MD;  Location: [facility];  Service: Cardiovascular;  Laterality: N/A;  .  LEFT HEART CATH AND CORONARY ANGIOGRAPHY N/A 03/29/2017   Procedure: LEFT HEART CATH AND CORONARY ANGIOGRAPHY;  Surgeon: Martinique, Peter M, MD;  Location: [facility];  Service: Cardiovascular;  Laterality: N/A;  . LEFT HEART CATH AND CORONARY ANGIOGRAPHY N/A 04/10/2017   Procedure: LEFT HEART CATH AND CORONARY ANGIOGRAPHY;  Surgeon: Martinique, Peter M, MD;  Location: [facility];  Service: Cardiovascular;  Laterality: N/A;  . TOTAL HIP ARTHROPLASTY Right 01/11/2015   Procedure: RIGHT TOTAL HIP ARTHROPLASTY ANTERIOR APPROACH;  Surgeon: Paralee Cancel, MD;  Location: [facility];  Service: Orthopedics;  Laterality: Right;    Current Medications: Outpatient Medications Prior to Visit  Medication Sig Dispense Refill  . aspirin 81 MG EC tablet TAKE 1 TABLET BY MOUTH EVERY DAY 90 tablet 3  . atorvastatin (LIPITOR) 80 MG tablet TAKE 1 TABLET (80 MG TOTAL) BY MOUTH DAILY AT 6 PM. 90 tablet 2  . colchicine 0.6 MG tablet Take 1 tablet (0.6 mg total) by mouth 2 (two) times daily. 90 tablet 1  . Multiple Vitamin (MULTIVITAMIN) tablet Take 1 tablet by mouth daily.    . nitroGLYCERIN (NITROSTAT) 0.4 MG SL tablet PLACE 1 TABLET UNDER THE TONGUE EVERY 5 MINUTES IF NEEDED 75 tablet 3  . valACYclovir (VALTREX) 500 MG tablet Take 1 tablet by mouth daily as needed.     No facility-administered medications prior to visit.     Allergies:   Patient has no known allergies.   Social History   Socioeconomic History  . Marital status: Single    Spouse name: Not on file  . Number of children: Not on  file  . Years of education: Not on file  . Highest education level: Not on file  Occupational History  . Occupation:  Financial controller: [employer]  Tobacco Use  . Smoking status: Former Smoker    Types: Cigars    Quit date: 03/29/2017    Years since quitting: 2.2  . Smokeless tobacco: Never Used  . Tobacco comment: cigar  Substance and Sexual Activity  . Alcohol use: No  . Drug use: No  . Sexual activity: Not on file  Other Topics Concern  . Not on file  Social History Narrative  . Not on file   Social Determinants of Health   Financial Resource Strain:   . Difficulty of Paying Living Expenses:   Food Insecurity:   . Worried About Charity fundraiser in the Last Year:   . Arboriculturist in the Last Year:   Transportation Needs:   . Film/video editor (Medical):   Marland [name] Lack of Transportation (Non-Medical):   Physical Activity:   . Days of Exercise per Week:   . Minutes of Exercise per Session:   Stress:   . Feeling of Stress :   Social Connections:   . Frequency of Communication with Friends and Family:   . Frequency of Social Gatherings with Friends and Family:   . Attends Religious Services:   . Active Member of Clubs or Organizations:   . Attends Archivist Meetings:   Marland [name] Marital Status:     Socially he is engaged to be remarried.  He is  previously married and has 2 daughters, ages 53 and 20 and a grandchild.  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   Family History:  The patient's family history includes Alzheimer's disease in his mother; CAD in  his father; Heart failure in his father.  His mother is alive at age 6 and father at age 47.  His father had CAD and CABG revascularization.  He has one brother and one sister.  ROS General: Negative; No fevers, chills, or night sweats;  HEENT: Negative; No changes in vision or hearing, sinus congestion, difficulty  swallowing Pulmonary: Negative; No cough, wheezing, shortness of breath, hemoptysis Cardiovascular: Status post recent non-STEMI/secondary to LAD occlusion GI: Negative; No nausea, vomiting, diarrhea, or abdominal pain GU: Negative; No dysuria, hematuria, or difficulty voiding Musculoskeletal: Negative; no myalgias, joint pain, or weakness Hematologic/Oncology: Negative; no easy bruising, bleeding Endocrine: Negative; no heat/cold intolerance; no diabetes Neuro: Negative; no changes in balance, headaches Skin: Negative; No rashes or skin lesions Psychiatric: Negative; No behavioral problems, depression Sleep: Positive for severe OSA diagnosed in 2007; has an oral appliance, but has not been using.  Higher to institution of CPAP he noted significant snoring, daytime sleepiness, hypersomnolence; No bruxism, restless legs, hypnogognic hallucinations, no cataplexy Other comprehensive 14 point system review is negative.   PHYSICAL EXAM:   VS:  BP (!) 142/84   Pulse 80   Ht 5' 9.5" (1.765 m)   Wt 184 lb 12.8 oz (83.8 kg)   SpO2 97%   BMI 26.90 kg/m     Repeat blood pressure by me was improved at 124/84  Wt Readings from Last 3 Encounters:  06/24/19 184 lb 12.8 oz (83.8 kg)  01/12/19 180 lb (81.6 kg)  04/15/18 183 lb 12.8 oz (83.4 kg)    General: Alert, oriented, no distress.  Skin: normal turgor, no rashes, warm and dry HEENT: Normocephalic, atraumatic. Pupils equal round and reactive to light; sclera anicteric; extraocular muscles intact;  Nose without nasal septal hypertrophy Mouth/Parynx benign; Mallinpatti scale 3 sitting; 4 while supine. Neck: No JVD, no carotid bruits; normal carotid upstroke Lungs: clear to ausculatation and percussion; no wheezing or rales Chest wall: without tenderness to palpitation Heart: PMI not displaced, RRR, s1 s2 normal, 1/6 systolic murmur, no diastolic murmur, no rubs, gallops, thrills, or heaves Abdomen: soft, nontender; no hepatosplenomehaly,  BS+; abdominal aorta nontender and not dilated by palpation. Back: no CVA tenderness Pulses 2+ Musculoskeletal: full range of motion, normal strength, no joint deformities Extremities: no clubbing cyanosis or edema, Homan's sign negative  Neurologic: grossly nonfocal; Cranial nerves grossly wnl Psychologic: Normal mood and affect    Studies/Labs Reviewed:   ECG (independently read by me): NSR at 80, isolated PAC, No STT changes; Normal intervals  Recent Labs: BMP Latest Ref Rng & Units 01/15/2019 12/30/2017 05/07/2017  Glucose 65 - 99 mg/dL 96 79 104(H)  BUN 8 - 27 mg/dL 16 16 26(H)  Creatinine 0.76 - 1.27 mg/dL 0.81 0.87 1.04  BUN/Creat Ratio 10 - 24 20 18  -  Sodium 134 - 144 mmol/L 140 142 139  Potassium 3.5 - 5.2 mmol/L 4.3 4.4 3.9  Chloride 96 - 106 mmol/L 103 102 109  CO2 20 - 29 mmol/L 22 22 20(L)  Calcium 8.6 - 10.2 mg/dL 10.0 9.8 9.0     Hepatic Function Latest Ref Rng & Units 01/15/2019 12/30/2017 04/01/2017  Total Protein 6.0 - 8.5 g/dL 7.0 6.7 5.9(L)  Albumin 3.8 - 4.9 g/dL 4.5 4.4 3.2(L)  AST 0 - 40 IU/L 27 31 45(H)  ALT 0 - 44 IU/L 31 35 27  Alk Phosphatase 39 - 117 IU/L 93 75 54  Total Bilirubin 0.0 - 1.2 mg/dL 0.5 0.4 0.6  Bilirubin, Direct 0.00 - 0.40  mg/dL 0.16 0.14 -    CBC Latest Ref Rng & Units 12/30/2017 05/06/2017 04/10/2017  WBC 3.4 - 10.8 x10E3/uL 7.9 7.8 6.9  Hemoglobin 13.0 - 17.7 g/dL 14.3 16.0 16.7  Hematocrit 37.5 - 51.0 % 41.5 46.8 49.4  Platelets 150 - 450 x10E3/uL 204 202 235   Lab Results  Component Value Date   MCV 94 12/30/2017   MCV 90.3 05/06/2017   MCV 90.3 04/10/2017   No results found for: TSH Lab Results  Component Value Date   HGBA1C 5.3 01/15/2019     BNP    Component Value Date/Time   BNP 78.9 05/07/2017 0144    ProBNP No results found for: PROBNP   Lipid Panel     Component Value Date/Time   CHOL 103 01/15/2019 0820   TRIG 87 01/15/2019 0820   HDL 40 01/15/2019 0820   CHOLHDL 2.6 01/15/2019 0820   CHOLHDL  6.0 03/30/2017 0251   VLDL 38 03/30/2017 0251   LDLCALC 46 01/15/2019 0820     RADIOLOGY: No results found.   Additional studies/ records that were reviewed today include:  I reviewed the patient's remote polysomnogram of 2007.  I reviewed his most recent hospitalizations and cardiac catheterizations as well as office note.  Labs were obtained today from November 26, 2017 through December 25, 2017 and more recently April 23, 2019 through May 22, 2019.   ASSESSMENT:    1. OSA (obstructive sleep apnea)   2. NSTEMI (non-ST elevated myocardial infarction) [facility]): March 29, 2017 with DES stent to LAD   3. Coronary artery disease involving native coronary artery of native heart without angina pectoris   4. Hyperlipidemia, unspecified hyperlipidemia type     PLAN:  Mr. Zakery Normington is a 59 year old Caucasian male who developed chest pain while working out at [employer] and was found to have total occlusion of his mid LAD March 29, 2017.  He underwent delayed but successful revascularization with DES stenting.  Retrospectively, he has a history of obstructive sleep apnea documented since 2007.  At that time his sleep apnea was severe.  It does not appear he ever initiated CPAP therapy, but ultimately received a customized oral appliance which he had not been  wearing.  When I initially saw him in the office he was 10 days following his acute coronary syndrome and had been readmitted for recurrent chest pain and had undergone repeat cardiac catheterization.  During that evaluation had a very long discussion with him regarding the importance of CPAP therapy.  He had awakened from sleep at night with chest pain leading to his presentation.  Remotely he had severe sleep apnea previously documented.  There was an attempt to try to have him undergo a new sleep study but despite numerous discussions this was denied by insurance.  He ultimately has been approved for an AutoPap titration.  At his  initial evaluation, he was not compliant and I reviewed the data several downloads with him.  When last seen, I recommended a mask change to an air fit F 30 mask.  Over the past several years, he has made major strides in his CPAP compliance.  He now feels very comfortable using therapy and realizes how much better he sleeps.  He has lost 15 pounds has changed his diet and avoids all red meat and essentially has a plant-based diet.  His most recent download shows excellent usage compliance however sleep duration is suboptimal.  He states he typically does not sleep very long.  I am changing his parameters and will manges ramp time from 45 minutes down to 25 minutes increase his minimum Rusher from 5 up to 7 cm.  Most recent download showing an AHI of 0.6 and his 95th percentile pressure was 11.7 with a maximum average pressure of 13.4.  Epworth Sleepiness Scale today endorsed at 10.  I suspect this is due to his inadequate sleep duration since his CPAP vents are excellent.  He essentially has stopped all his previous cardiac medications.  On repeat blood pressure by me today blood pressure was improved at 124/84.  He has not had any recurrent anginal symptomatology.  He continues to be on atorvastatin 80 mg daily for hyperlipidemia.  Continues to exercise regularly.  He will follow-up with [name] for his primary cardiology care.  I will see him in 1 year for follow-up sleep evaluation.   Medication Adjustments/Labs and Tests Ordered: Current medicines are reviewed at length with the patient today.  Concerns regarding medicines are outlined above.  Medication changes, Labs and Tests ordered today are listed in the Patient Instructions below. Patient Instructions  Medication Instructions:  CONTINUE WITH CURRENT MEDICATIONS. NO CHANGES.  *If you need a refill on your cardiac medications before your next appointment, please call your pharmacy*    Follow-Up: At [facility], you and your health needs  are our priority.  As part of our continuing mission to provide you with exceptional heart care, we have created designated Provider Care Teams.  These Care Teams include your primary Cardiologist (physician) and Advanced Practice Providers (APPs -  Physician Assistants and Nurse Practitioners) who all work together to provide you with the care you need, when you need it.  We recommend signing up for the patient portal called "MyChart".  Sign up information is provided on this After Visit Summary.  MyChart is used to connect with patients for Virtual Visits (Telemedicine).  Patients are able to view lab/test results, encounter notes, upcoming appointments, etc.  Non-urgent messages can be sent to your provider as well.   To learn more about what you can do with MyChart, go to [email or web address].    Your next appointment:   12 month(s)  The format for your next appointment:   In Person  Provider:   Shelva Majestic, MD   Other Instructions [place]    Time spent: 25 minutes  Signed, Shelva Majestic, MD  06/30/2019 6:36 PM    [facility] [address] [place], [place], [place]  [address] Phone: [phone]

## 2019-06-25 ENCOUNTER — Telehealth: Payer: Self-pay | Admitting: Cardiovascular Disease

## 2019-06-25 NOTE — Telephone Encounter (Signed)
1) What problem are you experiencing? Settings need to changed from 7 back to 5   2) Who is your medical equipment company?   [employer] is calling stating he wants his CPAP Machine settings changed back to 5 instead of 7. He states he was unable to sleep last night because it was flowing to fast. Please advise.   Please route to the sleep study assistant.

## 2019-06-26 ENCOUNTER — Telehealth: Payer: Self-pay | Admitting: *Deleted

## 2019-06-26 DIAGNOSIS — G4733 Obstructive sleep apnea (adult) (pediatric): Secondary | ICD-10-CM

## 2019-06-26 NOTE — Telephone Encounter (Signed)
Order placed to [employer] via community message per dr Claiborne Billings to change this pts min pressure from 5 to 7 today per Dr.Kelly's request.

## 2019-06-26 NOTE — Telephone Encounter (Signed)
-----   Message from Darlyn Chamber June, RN sent at 06/24/2019  6:50 PM EDT ----- I attempted to change this pts min pressure from 5 to 7 today per Dr.Kelly's request and was unable to do so. Pt called [facility] to have [place] linked to his machine but I'm not sure it went through correctly or if I am putting in the change incorrectly... please assist or advise!

## 2019-06-30 ENCOUNTER — Encounter: Payer: Self-pay | Admitting: Cardiovascular Disease

## 2019-07-20 ENCOUNTER — Ambulatory Visit: Payer: BC Managed Care – PPO | Admitting: Cardiology

## 2019-07-20 ENCOUNTER — Other Ambulatory Visit: Payer: Self-pay | Admitting: Cardiology

## 2019-09-18 NOTE — Progress Notes (Signed)
Cardiology Office Note    Date:  09/22/2019   ID:  James [name], DOB June 09, 1960, MRN [id number]  PCP:  [name], MD  Cardiologist:  [name]   Chief Complaint  Patient presents with  . Coronary Artery Disease    History of Present Illness:  James [name] is a 59 y.o. male with PMH of basal cell skin CA, polycythemia and hepatitis C presented with NSTEMI on 03/29/2017.  Troponin on a peak at 45.54.  Hemoglobin A1c 5.5. Total cholesterol 155, HDL 26, LDL 91, triglyceride 191.  Urgent cardiac catheterization on the same day showed 100% proximal LAD lesion treated with thrombectomy and 3.5 x 16 mm Synergy DES, EF 45%.   He was also placed on aspirin, Brilinta and high-dose statin.    He was also referred  to [name] for management of obstructive sleep apnea.  He could not tolerate full facial mask in the past, however I think he can tolerate nasal device instead. Later that same month he presented to the  hospital with chest pain on the following morning after the office visit.  Troponin was initially elevated and peaked at 2.7.  He underwent cardiac catheterization, however this showed his LAD stent was widely patent, there is mild jailing of the diagonal artery.  Retrospective, it was unclear if patient had transient partial stent thrombosis or coronary spasm at the end of the stent.  Echocardiogram was repeated which showed EF 55-60%, severe hypokinesis of the akinesis of mid anteroseptal and apical anterior wall, moderate LVH.  Ejection fraction has improved from the previous 40-45% on echo.  Low-dose nitrates were added, patient was eventually discharged on 04/11/2017.  He was admitted in February 2019 again with atypical chest and epigastric pain. He ruled out for MI. Seen by our service and pain was felt to be noncardiac.   He has also  been seen by a Cardiologist in [place]- Dr. [name]. Lisinopril discontinued due to dizziness and HA. Later nitrates also reduced  and later discontinued. He underwent a CPX on 07/05/17 and although results are not available in [place] the patient reports he did very well.   On follow up today he is doing very well. No cardiac symptoms. Wearing nasal CPAP at night.  He  is still  following the [employer] program with diet and meditation. He is using CPAP. He exercises regularly riding his bike 8 miles, lifting weights, playing golf.  He notes improvement in his breathing. No chest pain. 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     Past Medical History:  Diagnosis Date  . Acute systolic congestive heart failure ([place])   . Anemia    slight anemia with hepatitis tx  . Arthritis    oa  . Cancer ([place]) 4 months ago   basal cell skin cancer removed from chest area  . GERD (gastroesophageal reflux disease)   . Hepatitis C 04/29/2012   had savalta treatment 2 years ago, now cured  . Hyperlipidemia   . NSTEMI (non-ST elevated myocardial infarction) ([place]) 03/29/2017   1/19 PCI/DESx1 to mLAD, EF 45%  . Palpitations    at times     Past Surgical History:  Procedure Laterality Date  . ARTHROSCOPIC REPAIR ACL Right 15 years ago  . CORONARY STENT INTERVENTION N/A 03/29/2017   Procedure: CORONARY STENT INTERVENTION;  Surgeon: Martinique, Quy Lotts M, MD;  Location: [facility];  Service: Cardiovascular;  Laterality: N/A;  . CORONARY  THROMBECTOMY N/A 03/29/2017   Procedure: Coronary Thrombectomy;  Surgeon: Martinique, Suellen Durocher M, MD;  Location: [facility];  Service: Cardiovascular;  Laterality: N/A;  . LEFT HEART CATH AND CORONARY ANGIOGRAPHY N/A 03/29/2017   Procedure: LEFT HEART CATH AND CORONARY ANGIOGRAPHY;  Surgeon: Martinique, Bently Wyss M, MD;  Location: [facility];  Service: Cardiovascular;  Laterality: N/A;  . LEFT HEART CATH AND CORONARY ANGIOGRAPHY N/A 04/10/2017   Procedure: LEFT HEART CATH AND CORONARY ANGIOGRAPHY;  Surgeon: Martinique, Malaiyah Achorn M, MD;  Location: [facility];  Service:  Cardiovascular;  Laterality: N/A;  . TOTAL HIP ARTHROPLASTY Right 01/11/2015   Procedure: RIGHT TOTAL HIP ARTHROPLASTY ANTERIOR APPROACH;  Surgeon: Paralee Cancel, MD;  Location: [facility];  Service: Orthopedics;  Laterality: Right;    Current Medications: Outpatient Medications Prior to Visit  Medication Sig Dispense Refill  . aspirin 81 MG EC tablet TAKE 1 TABLET BY MOUTH EVERY DAY 90 tablet 3  . atorvastatin (LIPITOR) 80 MG tablet TAKE 1 TABLET BY MOUTH DAILY AT 6PM 90 tablet 2  . colchicine 0.6 MG tablet Take 1 tablet (0.6 mg total) by mouth 2 (two) times daily. 90 tablet 1  . Multiple Vitamin (MULTIVITAMIN) tablet Take 1 tablet by mouth daily.    . nitroGLYCERIN (NITROSTAT) 0.4 MG SL tablet PLACE 1 TABLET UNDER THE TONGUE EVERY 5 MINUTES IF NEEDED 75 tablet 3  . valACYclovir (VALTREX) 500 MG tablet Take 1 tablet by mouth daily as needed.     No facility-administered medications prior to visit.     Allergies:   Patient has no known allergies.   Social History   Socioeconomic History  . Marital status: Single    Spouse name: Not on file  . Number of children: Not on file  . Years of education: Not on file  . Highest education level: Not on file  Occupational History  . Occupation:  Financial controller: [employer]  Tobacco Use  . Smoking status: Former Smoker    Types: Cigars    Quit date: 03/29/2017    Years since quitting: 2.4  . Smokeless tobacco: Never Used  . Tobacco comment: cigar  Vaping Use  . Vaping Use: Never used  Substance and Sexual Activity  . Alcohol use: No  . Drug use: No  . Sexual activity: Not on file  Other Topics Concern  . Not on file  Social History Narrative  . Not on file   Social Determinants of Health   Financial Resource Strain:   . Difficulty of Paying Living Expenses:   Food Insecurity:   . Worried About Charity fundraiser in the Last Year:   . Arboriculturist in the Last Year:   Transportation Needs:   . Lexicographer (Medical):   [name] Lack of Transportation (Non-Medical):   Physical Activity:   . Days of Exercise per Week:   . Minutes of Exercise per Session:   Stress:   . Feeling of Stress :   Social Connections:   . Frequency of Communication with Friends and Family:   . Frequency of Social Gatherings with Friends and Family:   . Attends Religious Services:   . Active Member of Clubs or Organizations:   . Attends Archivist Meetings:   [name] Marital Status:      Family History:  The patient's family history includes Alzheimer's disease in his mother; CAD in his father; Heart failure in his father.  ROS:   Please see the history of present illness.    ROS All other systems reviewed and are negative.   PHYSICAL EXAM:   VS:  BP 130/80   Pulse 77   Temp (!) 97.1 F (36.2 C)   Ht 5\' 9"  (1.753 m)   Wt 185 lb 3.2 oz (84 kg)   SpO2 98%   BMI 27.35 kg/m  GENERAL:  Well appearing WM in NAD HEENT:  PERRL, EOMI, sclera are clear. Oropharynx is clear. NECK:  No jugular venous distention, carotid upstroke brisk and symmetric, no bruits, no thyromegaly or adenopathy LUNGS:  Clear to auscultation bilaterally CHEST:  Unremarkable HEART:  RRR,  PMI not displaced or sustained,S1 and S2 within normal limits, no S3, no S4: no clicks, no rubs, no murmurs ABD:  Soft, nontender. BS +, no masses or bruits. No hepatomegaly, no splenomegaly EXT:  2 + pulses throughout, no edema, no cyanosis no clubbing SKIN:  Warm and dry.  No rashes NEURO:  Alert and oriented x 3. Cranial nerves II through XII intact. PSYCH:  Cognitively intact   Wt Readings from Last 3 Encounters:  09/22/19 185 lb 3.2 oz (84 kg)  06/24/19 184 lb 12.8 oz (83.8 kg)  01/12/19 180 lb (81.6 kg)      Studies/Labs Reviewed:   EKG:  EKG is not ordered today.     Recent Labs: 01/15/2019: ALT 31; BUN 16; Creatinine, Ser 0.81; Potassium 4.3; Sodium 140   Lipid Panel    Component Value Date/Time   CHOL 103  01/15/2019 0820   TRIG 87 01/15/2019 0820   HDL 40 01/15/2019 0820   CHOLHDL 2.6 01/15/2019 0820   CHOLHDL 6.0 03/30/2017 0251   VLDL 38 03/30/2017 0251   LDLCALC 46 01/15/2019 0820   Labs dated 06/03/17: CDRP 0.48, A1c 6% Cholesterol 91, triglycerides 64, HDL 34, LDL 44.   Additional studies/ records that were reviewed today include:   Cath 03/29/2017 Conclusion     Prox LAD lesion is 100% stenosed.  Post intervention, there is a 0% residual stenosis.  A drug-eluting stent was successfully placed using a STENT SYNERGY DES 3.5X16.  The left ventricular systolic function is normal.  LV end diastolic pressure is normal.  The left ventricular ejection fraction is 45-50% by visual estimate.  1. Single vessel occlusive CAD- 100% mid LAD with collaterals 2. Mild LV dysfunction. EF 45% 3. Normal LVEDP 4. Successful aspiration thrombectomy and stenting of the mid LAD with DES   Plan: DAPT with ASA and Brilinta. IV Aggrastat for 18 hours. May be a candidate for DC Sunday if no complications. High dose statin.    Echo 03/30/2017 LV EF: 40% - 45%  Study Conclusions  - Left ventricle: Septal and apical akinesis inferior wall hypokinesis The cavity size was mildly dilated. Wall thickness was increased in a pattern of mild LVH. Systolic function was mildly to moderately reduced. The estimated ejection fraction was in the range of 40% to 45%. Left ventricular diastolic function parameters were normal. - Atrial septum: No defect or patent foramen ovale was identified.   Cath 04/10/2017 Conclusion     Previously placed Prox LAD stent (unknown type) is widely patent.  LV end diastolic pressure is normal.   1. No significant obstructive CAD. The stent in the LAD is widely patent. 2. Normal LVEDP  Plan: continue medical therapy.      ASSESSMENT:    1. Coronary artery disease involving native coronary artery of native heart without angina  pectoris    2. OSA (obstructive sleep apnea)   3. Hyperlipidemia, unspecified hyperlipidemia type      PLAN:  In order of problems listed above:   1. CAD: NSTEMI with DES of the proximal LAD in January 2019.  Repeat cardiac catheterization near the end of January 2019 showed patent stent. CPX in follow up 2020 summer was normal. Overall he is doing very well.  Now only on ASA and statin.  2. Hyperlipidemia: excellent control on statin. Will update labs.  3. Obstructive sleep apnea: Managed by [name]. On CPAP    Medication Adjustments/Labs and Tests Ordered: Current medicines are reviewed at length with the patient today.  Concerns regarding medicines are outlined above.  Medication changes, Labs and Tests ordered today are listed in the Patient Instructions below. There are no Patient Instructions on file for this visit.   Signed, Gibson Telleria Martinique, MD  09/22/2019 8:17 AM    [facility] [address], [place], [place]  [address] Phone: [phone]; Fax: [phone]

## 2019-09-22 ENCOUNTER — Ambulatory Visit (INDEPENDENT_AMBULATORY_CARE_PROVIDER_SITE_OTHER): Payer: BC Managed Care – PPO | Admitting: Cardiology

## 2019-09-22 ENCOUNTER — Other Ambulatory Visit: Payer: Self-pay

## 2019-09-22 ENCOUNTER — Encounter: Payer: Self-pay | Admitting: Cardiology

## 2019-09-22 VITALS — BP 130/80 | HR 77 | Temp 97.1°F | Ht 69.0 in | Wt 185.2 lb

## 2019-09-22 DIAGNOSIS — E785 Hyperlipidemia, unspecified: Secondary | ICD-10-CM | POA: Diagnosis not present

## 2019-09-22 DIAGNOSIS — I2510 Atherosclerotic heart disease of native coronary artery without angina pectoris: Secondary | ICD-10-CM | POA: Diagnosis not present

## 2019-09-22 DIAGNOSIS — G4733 Obstructive sleep apnea (adult) (pediatric): Secondary | ICD-10-CM

## 2019-09-22 LAB — HEPATIC FUNCTION PANEL
ALT: 32 IU/L (ref 0–44)
AST: 33 IU/L (ref 0–40)
Albumin: 4.4 g/dL (ref 3.8–4.8)
Alkaline Phosphatase: 78 IU/L (ref 48–121)
Bilirubin Total: 0.6 mg/dL (ref 0.0–1.2)
Bilirubin, Direct: 0.19 mg/dL (ref 0.00–0.40)
Total Protein: 6.7 g/dL (ref 6.0–8.5)

## 2019-09-22 LAB — LIPID PANEL
Chol/HDL Ratio: 2.3 ratio (ref 0.0–5.0)
Cholesterol, Total: 101 mg/dL (ref 100–199)
HDL: 44 mg/dL (ref >39)
LDL Chol Calc (NIH): 42 mg/dL (ref 0–99)
Triglycerides: 74 mg/dL (ref 0–149)
VLDL Cholesterol Cal: 15 mg/dL (ref 5–40)

## 2019-09-22 LAB — BASIC METABOLIC PANEL
BUN/Creatinine Ratio: 21 (ref 10–24)
BUN: 17 mg/dL (ref 8–27)
CO2: 23 mmol/L (ref 20–29)
Calcium: 9.7 mg/dL (ref 8.6–10.2)
Chloride: 103 mmol/L (ref 96–106)
Creatinine, Ser: 0.8 mg/dL (ref 0.76–1.27)
GFR calc Af Amer: 111 mL/min/{1.73_m2} (ref >59)
GFR calc non Af Amer: 96 mL/min/{1.73_m2} (ref >59)
Glucose: 89 mg/dL (ref 65–99)
Potassium: 4.3 mmol/L (ref 3.5–5.2)
Sodium: 138 mmol/L (ref 134–144)

## 2019-09-22 NOTE — Patient Instructions (Signed)
Medication Instructions:  Continue same medications *If you need a refill on your cardiac medications before your next appointment, please call your pharmacy*   Lab Work: Bmet,lipid and hepatic panels today   Testing/Procedures: None ordered   Follow-Up: At [facility], you and your health needs are our priority.  As part of our continuing mission to provide you with exceptional heart care, we have created designated Provider Care Teams.  These Care Teams include your primary Cardiologist (physician) and Advanced Practice Providers (APPs -  Physician Assistants and Nurse Practitioners) who all work together to provide you with the care you need, when you need it.  We recommend signing up for the patient portal called "MyChart".  Sign up information is provided on this After Visit Summary.  MyChart is used to connect with patients for Virtual Visits (Telemedicine).  Patients are able to view lab/test results, encounter notes, upcoming appointments, etc.  Non-urgent messages can be sent to your provider as well.   To learn more about what you can do with MyChart, go to [email or web address].    Your next appointment:  6 months    Call in Sept to schedule Jan appointment    The format for your next appointment: Office    Provider:  Dr.Jordan

## 2019-10-06 ENCOUNTER — Other Ambulatory Visit: Payer: Self-pay

## 2019-10-06 ENCOUNTER — Encounter: Payer: Self-pay | Admitting: Physician Assistant

## 2019-10-06 ENCOUNTER — Ambulatory Visit (INDEPENDENT_AMBULATORY_CARE_PROVIDER_SITE_OTHER): Payer: BC Managed Care – PPO | Admitting: Physician Assistant

## 2019-10-06 DIAGNOSIS — L814 Other melanin hyperpigmentation: Secondary | ICD-10-CM | POA: Diagnosis not present

## 2019-10-06 DIAGNOSIS — D1800 Hemangioma unspecified site: Secondary | ICD-10-CM

## 2019-10-06 DIAGNOSIS — Z1283 Encounter for screening for malignant neoplasm of skin: Secondary | ICD-10-CM | POA: Diagnosis not present

## 2019-10-06 DIAGNOSIS — L821 Other seborrheic keratosis: Secondary | ICD-10-CM

## 2019-10-06 DIAGNOSIS — D485 Neoplasm of uncertain behavior of skin: Secondary | ICD-10-CM | POA: Diagnosis not present

## 2019-10-06 DIAGNOSIS — L570 Actinic keratosis: Secondary | ICD-10-CM | POA: Diagnosis not present

## 2019-10-06 DIAGNOSIS — D229 Melanocytic nevi, unspecified: Secondary | ICD-10-CM

## 2019-10-06 DIAGNOSIS — D3611 Benign neoplasm of peripheral nerves and autonomic nervous system of face, head, and neck: Secondary | ICD-10-CM | POA: Diagnosis not present

## 2019-10-06 DIAGNOSIS — L578 Other skin changes due to chronic exposure to nonionizing radiation: Secondary | ICD-10-CM

## 2019-10-06 NOTE — Progress Notes (Signed)
New Patient   Subjective  James [name] is a 59 y.o. male who presents for the following: Annual Exam (PATIENT HAS PATH FROM PREVIOUS DERM).   The following portions of the chart were reviewed this encounter and updated as appropriate: Tobacco  Allergies  Meds  Problems  Med Hx  Surg Hx  Fam Hx      Objective  Well appearing patient in no apparent distress; mood and affect are within normal limits.  A full examination was performed including scalp, head, eyes, ears, nose, lips, neck, chest, axillae, abdomen, back, buttocks, bilateral upper extremities, bilateral lower extremities, hands, feet, fingers, toes, fingernails, and toenails. All findings within normal limits unless otherwise noted below.  Objective  Head to toe: No atypical nevi   Objective  Right Preauricular Area, Right Superior Helix, Right Temporal Scalp: Erythematous patches with gritty scale.  Objective  Right Anterior Neck: Pearly papule with telangectasia.      Objective  Left Anterior Neck: Pearly papule with telangectasia.      Assessment & Plan  Screening exam for skin cancer Head to toe  Yearly skin exam  AK (actinic keratosis) (3) Right Superior Helix; Right Preauricular Area; Right Temporal Scalp  Destruction of lesion - Right Preauricular Area, Right Superior Helix, Right Temporal Scalp Complexity: simple   Destruction method: cryotherapy   Informed consent: discussed and consent obtained   Timeout:  patient name, date of birth, surgical site, and procedure verified Lesion destroyed using liquid nitrogen: Yes   Cryotherapy cycles:  1 Outcome: patient tolerated procedure well with no complications   Post-procedure details: wound care instructions given    Neoplasm of uncertain behavior of skin (2) Right Anterior Neck  Epidermal / dermal shaving  Lesion diameter (cm):  1 Informed consent: discussed and consent obtained   Timeout: patient name, date of birth, surgical  site, and procedure verified   Procedure prep:  Patient was prepped and draped in usual sterile fashion Prep type:  Chlorhexidine Anesthesia: the lesion was anesthetized in a standard fashion   Anesthetic:  1% lidocaine w/ epinephrine 1-100,000 local infiltration Instrument used: DermaBlade   Hemostasis achieved with: aluminum chloride   Outcome: patient tolerated procedure well   Post-procedure details: sterile dressing applied and wound care instructions given   Dressing type: petrolatum gauze, petrolatum and bandage    Specimen 1 - Surgical pathology Differential Diagnosis: BCC Check Margins: No  Left Anterior Neck  Epidermal / dermal shaving  Lesion diameter (cm):  1 Informed consent: discussed and consent obtained   Timeout: patient name, date of birth, surgical site, and procedure verified   Procedure prep:  Patient was prepped and draped in usual sterile fashion Prep type:  Chlorhexidine Anesthesia: the lesion was anesthetized in a standard fashion   Anesthetic:  1% lidocaine w/ epinephrine 1-100,000 local infiltration Instrument used: DermaBlade   Hemostasis achieved with: aluminum chloride   Outcome: patient tolerated procedure well   Post-procedure details: sterile dressing applied and wound care instructions given   Dressing type: petrolatum gauze, petrolatum and bandage    Specimen 2 - Surgical pathology Differential Diagnosis: BCC Check Margins: No Lentigines - Scattered tan macules - Discussed due to sun exposure - Benign, observe - Call for any changes  Seborrheic Keratoses - Stuck-on, waxy, tan-brown papules and plaques  - Discussed benign etiology and prognosis. - Observe - Call for any changes  Melanocytic Nevi - Tan-brown and/or pink-flesh-colored symmetric macules and papules - Benign appearing on exam today - Observation -  Call clinic for new or changing moles - Recommend daily use of broad spectrum spf 30+ sunscreen to sun-exposed areas.    Hemangiomas - Red papules - Discussed benign nature - Observe - Call for any changes  Actinic Damage - diffuse scaly erythematous macules with underlying dyspigmentation - Recommend daily broad spectrum sunscreen SPF 30+ to sun-exposed areas, reapply every 2 hours as needed.  - Call for new or changing lesions.  Skin cancer screening performed today.

## 2019-10-06 NOTE — Patient Instructions (Signed)

## 2020-01-26 DIAGNOSIS — G4733 Obstructive sleep apnea (adult) (pediatric): Secondary | ICD-10-CM | POA: Diagnosis not present

## 2020-03-21 DIAGNOSIS — G4733 Obstructive sleep apnea (adult) (pediatric): Secondary | ICD-10-CM | POA: Diagnosis not present

## 2020-03-24 ENCOUNTER — Telehealth: Payer: Self-pay | Admitting: Physician Assistant

## 2020-03-24 NOTE — Telephone Encounter (Addendum)
Patient wants to know charges for removal of  2 lesions from his previous dermatologist.  Patient is scheduled on 07/25/2020 for removal with Robyne Askew, PAC.

## 2020-03-25 NOTE — Telephone Encounter (Addendum)
Phone call to patient regarding the cost for treatment of his lesions.  Patient states that he has two BCC's that need to be treated and he needs to know how much it's going to cost him to treat these spots because he has [employer] so he needs to know his cost.  I informed patient that I'm not sure how much it's going to cost because we don't know if the Provider is going to use curet for treatment or do an excision for the treatment.  Patient stated that he really needed to know the cost and that he would just come in and talk with the Provider to see if he can get a cost.

## 2020-03-29 NOTE — Progress Notes (Signed)
Cardiology Office Note    Date:  04/01/2020   ID:  James [name], DOB 1960/04/19, MRN [id number]  PCP:  [name], MD  Cardiologist:  [name]   Chief Complaint  Patient presents with  . Chest Pain    History of Present Illness:  James [name] is a 60 y.o. male with PMH of basal cell skin CA, polycythemia and hepatitis C presented with NSTEMI on 03/29/2017.  Troponin on a peak at 45.54.  Hemoglobin A1c 5.5. Total cholesterol 155, HDL 26, LDL 91, triglyceride 191.  Urgent cardiac catheterization on the same day showed 100% proximal LAD lesion treated with thrombectomy and 3.5 x 16 mm Synergy DES, EF 45%.   He was also placed on aspirin, Brilinta and high-dose statin.    He was also referred  to [name] for management of obstructive sleep apnea.  He could not tolerate full facial mask in the past, however I think he can tolerate nasal device instead. Later that same month he presented to the  hospital with chest pain on the following morning after the office visit.  Troponin was initially elevated and peaked at 2.7.  He underwent cardiac catheterization, however this showed his LAD stent was widely patent, there is mild jailing of the diagonal artery.  Retrospective, it was unclear if patient had transient partial stent thrombosis or coronary spasm at the end of the stent.  Echocardiogram was repeated which showed EF 55-60%, severe hypokinesis of the akinesis of mid anteroseptal and apical anterior wall, moderate LVH.  Ejection fraction has improved from the previous 40-45% on echo.  Low-dose nitrates were added, patient was eventually discharged on 04/11/2017.  He was admitted in February 2019 again with atypical chest and epigastric pain. He ruled out for MI. Seen by our service and pain was felt to be noncardiac.   He has also  been seen by a Cardiologist in [place]- Dr. [name]. Lisinopril discontinued due to dizziness and HA. Later nitrates also reduced and later  discontinued. He underwent a CPX in 2020 which was normal.  On follow up today he is doing very well. He has occasional pain in the left precordium radiating to his left arm. This is associated with stress. Not with exertion. He has not had to use Ntg. Symptoms are unchanged from before.  Wearing nasal CPAP at night.  He  is still  following the [employer] program with diet and meditation. He is using CPAP. He exercises regularly riding his bike 8 miles, lifting weights, playing golf.  He notes improvement in his breathing. 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 He is planning a trip to [place] later this year at altitude.    Past Medical History:  Diagnosis Date  . Acute systolic congestive heart failure ([place])   . Anemia    slight anemia with hepatitis tx  . Arthritis    oa  . Cancer ([place]) 4 months ago   basal cell skin cancer removed from chest area  . GERD (gastroesophageal reflux disease)   . Hepatitis C 04/29/2012   had savalta treatment 2 years ago, now cured  . Hyperlipidemia   . NSTEMI (non-ST elevated myocardial infarction) ([place]) 03/29/2017   1/19 PCI/DESx1 to mLAD, EF 45%  . Palpitations    at times     Past Surgical History:  Procedure Laterality Date  . ARTHROSCOPIC REPAIR ACL Right 15 years ago  . CORONARY STENT INTERVENTION N/A 03/29/2017   Procedure: CORONARY STENT  INTERVENTION;  Surgeon: Martinique, Acelin Ferdig M, MD;  Location: [facility];  Service: Cardiovascular;  Laterality: N/A;  . CORONARY THROMBECTOMY N/A 03/29/2017   Procedure: Coronary Thrombectomy;  Surgeon: Martinique, Nizhoni Parlow M, MD;  Location: [facility];  Service: Cardiovascular;  Laterality: N/A;  . LEFT HEART CATH AND CORONARY ANGIOGRAPHY N/A 03/29/2017   Procedure: LEFT HEART CATH AND CORONARY ANGIOGRAPHY;  Surgeon: Martinique, Ela Moffat M, MD;  Location: [facility];  Service: Cardiovascular;  Laterality: N/A;  . LEFT HEART CATH AND CORONARY ANGIOGRAPHY N/A 04/10/2017   Procedure: LEFT HEART CATH AND  CORONARY ANGIOGRAPHY;  Surgeon: Martinique, Chrishaun Sasso M, MD;  Location: [facility];  Service: Cardiovascular;  Laterality: N/A;  . TOTAL HIP ARTHROPLASTY Right 01/11/2015   Procedure: RIGHT TOTAL HIP ARTHROPLASTY ANTERIOR APPROACH;  Surgeon: Paralee Cancel, MD;  Location: [facility];  Service: Orthopedics;  Laterality: Right;    Current Medications: Outpatient Medications Prior to Visit  Medication Sig Dispense Refill  . aspirin 81 MG EC tablet TAKE 1 TABLET BY MOUTH EVERY DAY 90 tablet 3  . atorvastatin (LIPITOR) 80 MG tablet TAKE 1 TABLET BY MOUTH DAILY AT 6PM 90 tablet 2  . colchicine 0.6 MG tablet Take 1 tablet (0.6 mg total) by mouth 2 (two) times daily. 90 tablet 1  . Multiple Vitamin (MULTIVITAMIN) tablet Take 1 tablet by mouth daily.    . nitroGLYCERIN (NITROSTAT) 0.4 MG SL tablet PLACE 1 TABLET UNDER THE TONGUE EVERY 5 MINUTES IF NEEDED 75 tablet 3  . valACYclovir (VALTREX) 500 MG tablet Take 1 tablet by mouth daily as needed.     No facility-administered medications prior to visit.     Allergies:   Patient has no known allergies.   Social History   Socioeconomic History  . Marital status: Single    Spouse name: Not on file  . Number of children: Not on file  . Years of education: Not on file  . Highest education level: Not on file  Occupational History  . Occupation:  Financial controller: [employer]  Tobacco Use  . Smoking status: Former Smoker    Types: Cigars    Quit date: 03/29/2017    Years since quitting: 3.0  . Smokeless tobacco: Former Systems developer  . Tobacco comment: cigar  Vaping Use  . Vaping Use: Never used  Substance and Sexual Activity  . Alcohol use: No  . Drug use: No  . Sexual activity: Not on file  Other Topics Concern  . Not on file  Social History Narrative  . Not on file   Social Determinants of Health   Financial Resource Strain: Not on file  Food Insecurity: Not on file  Transportation Needs: Not on file  Physical Activity: Not on  file  Stress: Not on file  Social Connections: Not on file     Family History:  The patient's family history includes Alzheimer's disease in his mother; CAD in his father; Heart failure in his father.   ROS:   Please see the history of present illness.    ROS All other systems reviewed and are negative.   PHYSICAL EXAM:   VS:  BP 125/86   Pulse 65   Ht 5\' 10"  (1.778 m)   Wt 188 lb 3.2 oz (85.4 kg)   SpO2 98%   BMI 27.00 kg/m  GENERAL:  Well appearing WM in NAD HEENT:  PERRL, EOMI, sclera are clear. Oropharynx is clear. NECK:  No jugular  venous distention, carotid upstroke brisk and symmetric, no bruits, no thyromegaly or adenopathy LUNGS:  Clear to auscultation bilaterally CHEST:  Unremarkable HEART:  RRR,  PMI not displaced or sustained,S1 and S2 within normal limits, no S3, no S4: no clicks, no rubs, no murmurs ABD:  Soft, nontender. BS +, no masses or bruits. No hepatomegaly, no splenomegaly EXT:  2 + pulses throughout, no edema, no cyanosis no clubbing SKIN:  Warm and dry.  No rashes NEURO:  Alert and oriented x 3. Cranial nerves II through XII intact. PSYCH:  Cognitively intact   Wt Readings from Last 3 Encounters:  04/01/20 188 lb 3.2 oz (85.4 kg)  09/22/19 185 lb 3.2 oz (84 kg)  06/24/19 184 lb 12.8 oz (83.8 kg)      Studies/Labs Reviewed:   EKG:  EKG is not ordered today.     Recent Labs: 09/22/2019: ALT 32; BUN 17; Creatinine, Ser 0.80; Potassium 4.3; Sodium 138   Lipid Panel    Component Value Date/Time   CHOL 101 09/22/2019 0832   TRIG 74 09/22/2019 0832   HDL 44 09/22/2019 0832   CHOLHDL 2.3 09/22/2019 0832   CHOLHDL 6.0 03/30/2017 0251   VLDL 38 03/30/2017 0251   LDLCALC 42 09/22/2019 0832   Labs dated 06/03/17: CDRP 0.48, A1c 6% Cholesterol 91, triglycerides 64, HDL 34, LDL 44.   Additional studies/ records that were reviewed today include:   Cath 03/29/2017 Conclusion     Prox LAD lesion is 100% stenosed.  Post intervention, there  is a 0% residual stenosis.  A drug-eluting stent was successfully placed using a STENT SYNERGY DES 3.5X16.  The left ventricular systolic function is normal.  LV end diastolic pressure is normal.  The left ventricular ejection fraction is 45-50% by visual estimate.  1. Single vessel occlusive CAD- 100% mid LAD with collaterals 2. Mild LV dysfunction. EF 45% 3. Normal LVEDP 4. Successful aspiration thrombectomy and stenting of the mid LAD with DES   Plan: DAPT with ASA and Brilinta. IV Aggrastat for 18 hours. May be a candidate for DC Sunday if no complications. High dose statin.    Echo 03/30/2017 LV EF: 40% - 45%  Study Conclusions  - Left ventricle: Septal and apical akinesis inferior wall hypokinesis The cavity size was mildly dilated. Wall thickness was increased in a pattern of mild LVH. Systolic function was mildly to moderately reduced. The estimated ejection fraction was in the range of 40% to 45%. Left ventricular diastolic function parameters were normal. - Atrial septum: No defect or patent foramen ovale was identified.   Cath 04/10/2017 Conclusion     Previously placed Prox LAD stent (unknown type) is widely patent.  LV end diastolic pressure is normal.   1. No significant obstructive CAD. The stent in the LAD is widely patent. 2. Normal LVEDP  Plan: continue medical therapy.      ASSESSMENT:    1. Coronary artery disease involving native coronary artery of native heart with angina pectoris ([place])   2. OSA (obstructive sleep apnea)   3. Hyperlipidemia, unspecified hyperlipidemia type      PLAN:  In order of problems listed above:   1. CAD: NSTEMI with DES of the proximal LAD in January 2019.  Repeat cardiac catheterization near the end of January 2019 showed patent stent. CPX in follow up 2020 summer was normal. Overall he is doing very well. Infrequent chest pain. Stable.  Now only on ASA and statin.  2. Hyperlipidemia:  excellent control on  statin.   3. Obstructive sleep apnea: Managed by [name]. On CPAP  We discussed his trip to [place]. Discussed the possibility of high altitude sickness and prevention. Given Rx for Diamox 125 mg bid to take when at altitude.     Medication Adjustments/Labs and Tests Ordered: Current medicines are reviewed at length with the patient today.  Concerns regarding medicines are outlined above.  Medication changes, Labs and Tests ordered today are listed in the Patient Instructions below. There are no Patient Instructions on file for this visit.   Signed, Mica Ramdass Martinique, MD  04/01/2020 8:15 AM    [facility] [address], [place], [place]  [address] Phone: [phone]; Fax: [phone]

## 2020-04-01 ENCOUNTER — Ambulatory Visit (INDEPENDENT_AMBULATORY_CARE_PROVIDER_SITE_OTHER): Payer: BC Managed Care – PPO | Admitting: Cardiology

## 2020-04-01 ENCOUNTER — Other Ambulatory Visit: Payer: Self-pay

## 2020-04-01 ENCOUNTER — Encounter: Payer: Self-pay | Admitting: Cardiology

## 2020-04-01 VITALS — BP 125/86 | HR 65 | Ht 70.0 in | Wt 188.2 lb

## 2020-04-01 DIAGNOSIS — I25119 Atherosclerotic heart disease of native coronary artery with unspecified angina pectoris: Secondary | ICD-10-CM | POA: Diagnosis not present

## 2020-04-01 DIAGNOSIS — G4733 Obstructive sleep apnea (adult) (pediatric): Secondary | ICD-10-CM

## 2020-04-01 DIAGNOSIS — E785 Hyperlipidemia, unspecified: Secondary | ICD-10-CM

## 2020-04-01 MED ORDER — ACETAZOLAMIDE 125 MG PO TABS: 125.0000 mg | ORAL_TABLET | Freq: Two times a day (BID) | ORAL | 3 refills | Status: DC

## 2020-04-14 ENCOUNTER — Other Ambulatory Visit: Payer: Self-pay | Admitting: Cardiology

## 2020-05-04 DIAGNOSIS — B009 Herpesviral infection, unspecified: Secondary | ICD-10-CM | POA: Diagnosis not present

## 2020-05-04 DIAGNOSIS — L298 Other pruritus: Secondary | ICD-10-CM | POA: Diagnosis not present

## 2020-07-12 ENCOUNTER — Ambulatory Visit (INDEPENDENT_AMBULATORY_CARE_PROVIDER_SITE_OTHER): Payer: BC Managed Care – PPO | Admitting: Cardiovascular Disease

## 2020-07-12 ENCOUNTER — Encounter: Payer: Self-pay | Admitting: Cardiovascular Disease

## 2020-07-12 VITALS — BP 125/75 | HR 70 | Ht 69.0 in | Wt 191.8 lb

## 2020-07-12 DIAGNOSIS — E785 Hyperlipidemia, unspecified: Secondary | ICD-10-CM | POA: Diagnosis not present

## 2020-07-12 DIAGNOSIS — I214 Non-ST elevation (NSTEMI) myocardial infarction: Secondary | ICD-10-CM | POA: Diagnosis not present

## 2020-07-12 DIAGNOSIS — G4733 Obstructive sleep apnea (adult) (pediatric): Secondary | ICD-10-CM

## 2020-07-12 NOTE — Progress Notes (Signed)
Cardiology Office Note    Date:  07/14/2020   ID:  James [name], DOB 15-May-1960, MRN [id number]  PCP:  [name], MD  Cardiologist:  Shelva Majestic, MD   One year F/U sleep evaluation.  History of Present Illness:  James [name] is a 60 y.o. male who is status post a recent PCI to a totally occluded LAD.  He was referred for sleep evaluation and I initially saw him in April 11, 2017.    James [name] has a history of obstructive sleep apnea which was initially diagnosed in May 2007 when he underwent a nocturnal polysomnogram which was interpreted by Dr. Annamaria Boots.  At that time, he had severe sleep apnea with an AHI of 45.9 per hour; random AHI was 43.1 per hour.  He had very loud snoring with oxygen desaturation to a nadir of 72%.  Apparently, he was never started on CPAP therapy, but at some point had an oral appliance customized which he had worn intermittently but had not used in some time.  He exercises frequently and works out regularly at sport time.  On January 18 while doing a cardiac workout.  He began to notice substernal chest tightness.  He saw his PCP and was told to take antacids.  Cover laboratory was drawn and he was later notified that troponins were elevated.  He presented to the hospital and underwent cardiac catheterization by [name] on January 18 in the early evening and was found to have total mid LAD occlusion which was successfully stented.  He was seen on 04/09/2017 by Almyra Deforest in the office follow-up and at that time due to his poor sleep.  He was advised to undergo a sleep evaluation and was scheduled to see me today.  However, later that night he again experienced recurrent chest pain, was rehospitalized and underwent repeat cardiac catheterization yesterday which revealed a widely patent stent.  He was discharged early this morning and presented to the office for further evaluation with me later that day.  When I saw him he was having anxiety some  anxiety following his heart attack.  He is concerned that he needs to get his sleep apnea treated as soon as possible.  An echo Doppler study on 03/30/2017 showed an EF of 40-45%.  There was mild LVH.  There was septal and apical akinesis. [name]  He admits to slowing awakening 1-2 times per night, nonrestorative sleep, and daytime sleepiness.  An Epworth Sleepiness Scale score was calculated in the office  which endorsed at 17 and was consistent with excessive daytime sleepiness as shown below:  Epworth Sleepiness Scale: Situation   Chance of Dozing/Sleeping (0 = never , 1 = slight chance , 2 = moderate chance , 3 = high chance )   sitting and reading 3   watching TV 3   sitting inactive in a public place 3   being a passenger in a motor vehicle for an hour or more 1   lying down in the afternoon 3   sitting and talking to someone 0   sitting quietly after lunch (no alcohol) 3   while stopped for a few minutes in traffic as the driver 1   Total Score  17    When I initially saw him, and I spent a considerable amount of time with him reviewing his hospitalization as well as his reason to reinitiate CPAP therapy.  Since I saw him, we had numerous conversations with insurance company  he would denied his follow-up sleep study.  Ultimately after long discussions the insurance company agreed for him to have AutoPap titration at home.  I saw him for follow-up evaluation in April 2021.  His set up date via [employer] as his DME company was May 16, 2017.  I reviewed a download from June 01, 2017 through June 30, 2017 which revealed that he did not start using CPAP therapy until April 9.  He used it every day for 13 days at the end of that cycle but had significant mask leak.  I then obtain a new download from April 1 through July 09, 2017 which again demonstrates 13 stretch where he used therapy but he has not used CPAP since April 21.  As result he is not compliant presently with usage days at only 43% with  average use is at 5 hours and 16 minutes.  He has been participating in a weight loss program and has lost 12 pounds.  He is not lifting for mass as he had in the past and he believes he is lost some of his pectoral size.  He believes the reduction of this may accommodate for his need for CPAP use.   I last saw him in April 2021.  Since his prior evaluation in 2019 he has spent half the time in [place] and half the time in [place].  [employer] is now his DME company.  He admitted to excellent compliance with CPAP and once he had gotten used to it has used it every night.  Download was obtained from April 23, 2019 through May 22, 2019.  He essentially used it all nights.  However sleep duration is suboptimal with median usage on days used at only 4 hours and 4 minutes.  He states that he does not sleep very long.  His AutoSet unit is set at a minimum pressure of 5 with maximum pressure of 20.  AHI 0.6.  His 95th percentile pressure is 11.7.  He is sleeping well.  He has had purposeful weight loss and admits to a 15 pound weight loss.  He has changed his diet and basically has a plant-based diet.  He continues to exercise regularly at the gym.  He denied any recurrent anginal symptomatology.  He continued to be on atorvastatin 80 mg for hyperlipidemia.  He is on aspirin.  During that evaluation, his Epworth scale endorsed at 10, being slightly increased most likely due to inadequate sleep duration.  His blood pressure was excellent.  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  He has seen [name] for cardiology follow-up and has not experienced any recurrent anginal symptomatology.  His blood pressure has been stable.  He continues to be on a plant-based diet.  He continues to use CPAP.  A download was obtained from April 3 through Jul 12, 2019 which confirms excellent compliance.  Again, sleep duration was only 5 hours and 40 minutes.   However AHI was excellent at 0.8.  His 95th percentile pressure was 10.7 with a maximum average pressure of 12.7.  He is unaware of breakthrough snoring.  He has been without recurrent anginal symptoms or awareness of palpitations.  His sleep is restorative.  He presents for evaluation.  Past Medical History:  Diagnosis Date  . Acute systolic congestive heart failure ([place])   . Anemia    slight anemia with hepatitis tx  . Arthritis  oa  . 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 GERD (gastroesophageal reflux disease)   . Hepatitis C 04/29/2012   had savalta treatment 2 years ago, now cured  . Hyperlipidemia   . NSTEMI (non-ST elevated myocardial infarction) ([place]) 03/29/2017   1/19 PCI/DESx1 to mLAD, EF 45%  . Palpitations    at times     Past Surgical History:  Procedure Laterality Date  . ARTHROSCOPIC REPAIR ACL Right 15 years ago  . CORONARY STENT INTERVENTION N/A 03/29/2017   Procedure: CORONARY STENT INTERVENTION;  Surgeon: Martinique, Peter M, MD;  Location: [facility];  Service: Cardiovascular;  Laterality: N/A;  . CORONARY THROMBECTOMY N/A 03/29/2017   Procedure: Coronary Thrombectomy;  Surgeon: Martinique, Peter M, MD;  Location: [facility];  Service: Cardiovascular;  Laterality: N/A;  . LEFT HEART CATH AND CORONARY ANGIOGRAPHY N/A 03/29/2017   Procedure: LEFT HEART CATH AND CORONARY ANGIOGRAPHY;  Surgeon: Martinique, Peter M, MD;  Location: [facility];  Service: Cardiovascular;  Laterality: N/A;  . LEFT HEART CATH AND CORONARY ANGIOGRAPHY N/A 04/10/2017   Procedure: LEFT HEART CATH AND CORONARY ANGIOGRAPHY;  Surgeon: Martinique, Peter M, MD;  Location: [facility];  Service: Cardiovascular;  Laterality: N/A;  . TOTAL HIP ARTHROPLASTY Right 01/11/2015   Procedure: RIGHT TOTAL HIP ARTHROPLASTY ANTERIOR APPROACH;  Surgeon: Paralee Cancel, MD;  Location: [facility];  Service: Orthopedics;  Laterality: Right;    Current  Medications: Outpatient Medications Prior to Visit  Medication Sig Dispense Refill  . aspirin 81 MG EC tablet TAKE 1 TABLET BY MOUTH EVERY DAY 90 tablet 3  . atorvastatin (LIPITOR) 80 MG tablet TAKE 1 TABLET BY MOUTH DAILY AT 6PM 90 tablet 2  . colchicine 0.6 MG tablet Take 1 tablet (0.6 mg total) by mouth 2 (two) times daily. 90 tablet 1  . Multiple Vitamin (MULTIVITAMIN) tablet Take 1 tablet by mouth daily.    . nitroGLYCERIN (NITROSTAT) 0.4 MG SL tablet PLACE 1 TABLET UNDER THE TONGUE EVERY 5 MINUTES IF NEEDED 75 tablet 3  . valACYclovir (VALTREX) 500 MG tablet Take 1 tablet by mouth daily as needed.    [name] acetaZOLAMIDE (DIAMOX) 125 MG tablet Take 1 tablet (125 mg total) by mouth 2 (two) times daily. 10 tablet 3   No facility-administered medications prior to visit.     Allergies:   Patient has no known allergies.   Social History   Socioeconomic History  . Marital status: Single    Spouse name: Not on file  . Number of children: Not on file  . Years of education: Not on file  . Highest education level: Not on file  Occupational History  . Occupation:  Financial controller: [employer]  Tobacco Use  . Smoking status: Former Smoker    Types: Cigars    Quit date: 03/29/2017    Years since quitting: 3.2  . Smokeless tobacco: Former Systems developer  . Tobacco comment: cigar  Vaping Use  . Vaping Use: Never used  Substance and Sexual Activity  . Alcohol use: No  . Drug use: No  . Sexual activity: Not on file  Other Topics Concern  . Not on file  Social History Narrative  . Not on file   Social Determinants of Health   Financial Resource Strain: Not on file  Food Insecurity: Not on file  Transportation Needs: Not on file  Physical Activity: Not on  file  Stress: Not on file  Social Connections: Not on file    Socially he is engaged to be remarried.  He is  previously married and has 2 daughters, ages 54 and 55 and a grandchild.  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   Family History:  The patient's family history includes Alzheimer's disease in his mother; CAD in his father; Heart failure in his father.  His mother is alive at age 78 and father at age 1.  His father had CAD and CABG revascularization.  He has one brother and one sister.  ROS General: Negative; No fevers, chills, or night sweats;  HEENT: Negative; No changes in vision or hearing, sinus congestion, difficulty swallowing Pulmonary: Negative; No cough, wheezing, shortness of breath, hemoptysis Cardiovascular: Status post recent non-STEMI/secondary to LAD occlusion GI: Negative; No nausea, vomiting, diarrhea, or abdominal pain GU: Negative; No dysuria, hematuria, or difficulty voiding Musculoskeletal: Negative; no myalgias, joint pain, or weakness Hematologic/Oncology: Negative; no easy bruising, bleeding Endocrine: Negative; no heat/cold intolerance; no diabetes Neuro: Negative; no changes in balance, headaches Skin: Negative; No rashes or skin lesions Psychiatric: Negative; No behavioral problems, depression Sleep: Positive for severe OSA diagnosed in 2007; has an oral appliance, but has not been using.  Higher to institution of CPAP he noted significant snoring, daytime sleepiness, hypersomnolence; No bruxism, restless legs, hypnogognic hallucinations, no cataplexy Other comprehensive 14 point system review is negative.   PHYSICAL EXAM:   VS:  BP 125/75   Pulse 70   Ht 5' 9"  (1.753 m)   Wt 191 lb 12.8 oz (87 kg)   SpO2 97%   BMI 28.32 kg/m     Repeat blood pressure by me was 120/78  Wt Readings from Last 3 Encounters:  07/12/20 191 lb 12.8 oz (87 kg)  04/01/20 188 lb 3.2 oz (85.4 kg)  09/22/19 185 lb 3.2 oz (84 kg)    General: Alert, oriented, no distress.  Skin: normal turgor, no rashes, warm and dry HEENT: Normocephalic, atraumatic. Pupils equal round and reactive to light; sclera anicteric; extraocular  muscles intact;  Nose without nasal septal hypertrophy Mouth/Parynx benign; Mallinpatti scale 3 Neck: No JVD, no carotid bruits; normal carotid upstroke Lungs: clear to ausculatation and percussion; no wheezing or rales Chest wall: without tenderness to palpitation Heart: PMI not displaced, RRR, s1 s2 normal, 1/6 systolic murmur, no diastolic murmur, no rubs, gallops, thrills, or heaves Abdomen: soft, nontender; no hepatosplenomehaly, BS+; abdominal aorta nontender and not dilated by palpation. Back: no CVA tenderness Pulses 2+ Musculoskeletal: full range of motion, normal strength, no joint deformities Extremities: no clubbing cyanosis or edema, Homan's sign negative  Neurologic: grossly nonfocal; Cranial nerves grossly wnl Psychologic: Normal mood and affect   Studies/Labs Reviewed:   ECG (independently read by me): Normal sinus rhythm at 70 bpm with mild sinus arrhythmia.  Normal intervals.  April 2021 ECG (independently read by me): NSR at 80, isolated PAC, No STT changes; Normal intervals  Recent Labs: BMP Latest Ref Rng & Units 09/22/2019 01/15/2019 12/30/2017  Glucose 65 - 99 mg/dL 89 96 79  BUN 8 - 27 mg/dL 17 16 16   Creatinine 0.76 - 1.27 mg/dL 0.80 0.81 0.87  BUN/Creat Ratio 10 - 24 21 20 18   Sodium 134 - 144 mmol/L 138 140 142  Potassium 3.5 - 5.2 mmol/L 4.3 4.3 4.4  Chloride 96 - 106 mmol/L 103 103 102  CO2 20 - 29 mmol/L 23 22  22  Calcium 8.6 - 10.2 mg/dL 9.7 10.0 9.8     Hepatic Function Latest Ref Rng & Units 09/22/2019 01/15/2019 12/30/2017  Total Protein 6.0 - 8.5 g/dL 6.7 7.0 6.7  Albumin 3.8 - 4.8 g/dL 4.4 4.5 4.4  AST 0 - 40 IU/L 33 27 31  ALT 0 - 44 IU/L 32 31 35  Alk Phosphatase 48 - 121 IU/L 78 93 75  Total Bilirubin 0.0 - 1.2 mg/dL 0.6 0.5 0.4  Bilirubin, Direct 0.00 - 0.40 mg/dL 0.19 0.16 0.14    CBC Latest Ref Rng & Units 12/30/2017 05/06/2017 04/10/2017  WBC 3.4 - 10.8 x10E3/uL 7.9 7.8 6.9  Hemoglobin 13.0 - 17.7 g/dL 14.3 16.0 16.7  Hematocrit  37.5 - 51.0 % 41.5 46.8 49.4  Platelets 150 - 450 x10E3/uL 204 202 235   Lab Results  Component Value Date   MCV 94 12/30/2017   MCV 90.3 05/06/2017   MCV 90.3 04/10/2017   No results found for: TSH Lab Results  Component Value Date   HGBA1C 5.3 01/15/2019     BNP    Component Value Date/Time   BNP 78.9 05/07/2017 0144    ProBNP No results found for: PROBNP   Lipid Panel     Component Value Date/Time   CHOL 101 09/22/2019 0832   TRIG 74 09/22/2019 0832   HDL 44 09/22/2019 0832   CHOLHDL 2.3 09/22/2019 0832   CHOLHDL 6.0 03/30/2017 0251   VLDL 38 03/30/2017 0251   LDLCALC 42 09/22/2019 0832     RADIOLOGY: No results found.   Additional studies/ records that were reviewed today include:  I reviewed the patient's remote polysomnogram of 2007.  I reviewed his most recent hospitalizations and cardiac catheterizations as well as office note.  Labs were obtained today from November 26, 2017 through December 25, 2017 and more recently April 23, 2019 through May 22, 2019.   ASSESSMENT:    1. OSA (obstructive sleep apnea)   2. NSTEMI (non-ST elevated myocardial infarction) [facility]): March 29, 2017 with DES stent to LAD   3. Hyperlipidemia with target LDL less than 70     PLAN:  James [name] is a 60 year-old Caucasian male who developed chest pain while working out at [employer] and was found to have total occlusion of his mid LAD March 29, 2017.  He underwent delayed but successful revascularization with DES stenting.  Retrospectively, he has a history of obstructive sleep apnea documented since 2007.  At that time his sleep apnea was severe.  At that time he did not initiate CPAP therapy, but ultimately received a customized oral appliance which he had not been  wearing.  When I initially saw him in the office in 2019 he was 10 days following his acute coronary syndrome and had been readmitted for recurrent chest pain and had undergone repeat cardiac  catheterization.  During that evaluation I had a very long discussion with him regarding the importance of CPAP therapy.  He had awakened from sleep at night with chest pain leading to his presentation.  Remotely he had severe sleep apnea previously documented.  There was an attempt to try to have him undergo a new sleep study but despite numerous discussions this was denied by insurance.  He ultimately has been approved for an AutoPap titration.  At his initial evaluation, he was not compliant and I reviewed the data several downloads with him.  When last seen, I recommended a mask change to an air fit F  30 mask.  Over the past several years, he has made major strides in his CPAP compliance.  He now feels very comfortable using therapy and realizes how much better he sleeps.  He has lost 15 pounds has changed his diet and avoids all red meat and essentially has a plant-based diet.  When seen in 2021 AHI was excellent on therapy at 0.6.  His present download from April 3 through Jul 11, 2020 continues to show excellent compliance.  AHI is 0.8.  His CPAP auto unit is set at a range of 5-20 but his 95th percentile pressure is 10.7 with a maximum average of 12.7.  He does not have any significant mask leak.  Sleep duration however is suboptimal at only 5 hours and 40 minutes.  I again discussed optimal sleep duration at least 7 to 8 hours if at all possible.  He remains active.  He continues to exercise regularly and continues to follow his essentially plant-based diet.  He has not had recurrent anginal symptomatology.  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  His blood pressure today is stable excellent at 120/78 when rechecked by me.  He is no longer on any cardiac medications with the exception of aspirin 81 mg and atorvastatin 80 mg for hyperlipidemia.  Lipid studies in July 2021 were excellent with an LDL of 42.  He will return to the cardiology care of [name].   I will see him in 1 to 2 years for follow-up sleep evaluation or sooner as needed.    His most recent download shows excellent usage compliance however sleep duration is suboptimal.  He states he typically does not sleep very long.  I am changing his parameters and will manges ramp time from 45 minutes down to 25 minutes increase his minimum Rusher from 5 up to 7 cm.  Most recent download showing an AHI of 0.6 and his 95th percentile pressure was 11.7 with a maximum average pressure of 13.4.  Epworth Sleepiness Scale today endorsed at 10.  I suspect this is due to his inadequate sleep duration since his CPAP vents are excellent.  He essentially has stopped all his previous cardiac medications.  On repeat blood pressure by me today blood pressure was improved at 124/84.  He has not had any recurrent anginal symptomatology.  He continues to be on atorvastatin 80 mg daily for hyperlipidemia.  Continues to exercise regularly.  He will follow-up with [name] for his primary cardiology care.  I will see him in 1 year for follow-up sleep evaluation.   Medication Adjustments/Labs and Tests Ordered: Current medicines are reviewed at length with the patient today.  Concerns regarding medicines are outlined above.  Medication changes, Labs and Tests ordered today are listed in the Patient Instructions below. Patient Instructions  Medication Instructions:  Continue same medications *If you need a refill on your cardiac medications before your next appointment, please call your pharmacy*   Lab Work: None ordered   Testing/Procedures: None ordered   Follow-Up: At [facility], you and your health needs are our priority.  As part of our continuing mission to provide you with exceptional heart care, we have created designated Provider Care Teams.  These Care Teams include your primary Cardiologist (physician) and Advanced Practice Providers (APPs -  Physician Assistants and Nurse Practitioners) who all  work together to provide you with the care you need, when you need it.  We recommend signing up  for the patient portal called "MyChart".  Sign up information is provided on this After Visit Summary.  MyChart is used to connect with patients for Virtual Visits (Telemedicine).  Patients are able to view lab/test results, encounter notes, upcoming appointments, etc.  Non-urgent messages can be sent to your provider as well.   To learn more about what you can do with MyChart, go to [email or web address].    Your next appointment: 1 year   The format for your next appointment: Office   Provider:  Dr.Damian Buckles     Time spent: 25 minutes  Signed, Shelva Majestic, MD  07/14/2020 11:43 AM    [facility] [address] [place], [place], [place]  [address] Phone: [phone]

## 2020-07-12 NOTE — Patient Instructions (Signed)
Medication Instructions:  Continue same medications *If you need a refill on your cardiac medications before your next appointment, please call your pharmacy*   Lab Work: None ordered   Testing/Procedures: None ordered   Follow-Up: At [facility], you and your health needs are our priority.  As part of our continuing mission to provide you with exceptional heart care, we have created designated Provider Care Teams.  These Care Teams include your primary Cardiologist (physician) and Advanced Practice Providers (APPs -  Physician Assistants and Nurse Practitioners) who all work together to provide you with the care you need, when you need it.  We recommend signing up for the patient portal called "MyChart".  Sign up information is provided on this After Visit Summary.  MyChart is used to connect with patients for Virtual Visits (Telemedicine).  Patients are able to view lab/test results, encounter notes, upcoming appointments, etc.  Non-urgent messages can be sent to your provider as well.   To learn more about what you can do with MyChart, go to [email or web address].      Your next appointment:  1 year    The format for your next appointment:  Office   Provider:  Dr.Kelly   

## 2020-07-14 ENCOUNTER — Encounter: Payer: Self-pay | Admitting: Cardiovascular Disease

## 2020-07-20 NOTE — Addendum Note (Signed)
Addended by: Lubertha Sayres on: 07/20/2020 07:15 AM   Modules accepted: Orders

## 2020-07-21 ENCOUNTER — Other Ambulatory Visit: Payer: Self-pay

## 2020-07-21 ENCOUNTER — Encounter: Payer: Self-pay | Admitting: Physician Assistant

## 2020-07-21 ENCOUNTER — Ambulatory Visit (INDEPENDENT_AMBULATORY_CARE_PROVIDER_SITE_OTHER): Payer: BC Managed Care – PPO | Admitting: Physician Assistant

## 2020-07-21 DIAGNOSIS — D489 Neoplasm of uncertain behavior, unspecified: Secondary | ICD-10-CM

## 2020-07-21 NOTE — Progress Notes (Signed)
Got patient back into exam room.  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  I told patient that's correct in order for me to keep my job I have to wear my mask.  He stated" its all about control...Marland KitchenMarland KitchenSociety just want to control everything we do ". Just like if I go and get tested for COVID a government agency has to call me back with questions ....why should the government even know or need to know what test I had done... IT all about CONTROL...they want control over everything and everybody.

## 2020-07-21 NOTE — Progress Notes (Signed)
   Follow-Up Visit   Subjective  James [name] is a 60 y.o. male who presents for the following: No chief complaint on file..   The following portions of the chart were reviewed this encounter and updated as appropriate:      Objective  Well appearing patient in no apparent distress; mood and affect are within normal limits.  A focused examination was performed including chest. Relevant physical exam findings are noted in the Assessment and Plan.  Objective  Right Breast: Two pink lesions on chest. Unsure of exact location of previously biopsied BCC. These biopsies were performed at another Dermatology office. No treatment was performed.   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  The documentation on 07/21/20 for the exam, diagnosis, procedures and orders are all accurate and complete.

## 2020-07-21 NOTE — Progress Notes (Signed)
Patient was agitated when I informed him that it was impossible for me to identify where the Basal cell was located on his chest according to the way his previous Dermatologist had it labeled.  I drew him a diagram to try to explain the problem. Lauren was talking to him so I left to get Dr. Denna Haggard to further explain the problem. I went back to the room to inform him that my nurse would call his previous dermatologist's office to see if they had any photos or drawings that could help [place] identify where the lesion was located. He was upset and left. I told him that we could reschedule when we get the information.  He was condescending in his words and attitude indicating that we did not know what we were doing.

## 2020-08-03 ENCOUNTER — Telehealth: Payer: Self-pay | Admitting: Physician Assistant

## 2020-08-03 ENCOUNTER — Encounter: Payer: Self-pay | Admitting: Physician Assistant

## 2020-08-03 NOTE — Telephone Encounter (Signed)
See previous note. It is with a lot of thought that I don't take lightly. James [name] was very demeaning and agitated. I choose not to see this type of patient so he will be dismissed from this practice.

## 2020-08-03 NOTE — Telephone Encounter (Signed)
Wants to know if we got records from [facility] indicating which place needs to be cut out + if so can he schedule appointment. Was here ~a week ago.

## 2020-08-03 NOTE — Telephone Encounter (Signed)
I spoke with the patient today about the situation that happened on 07/21/20 after speaking with Vida Roller and Lauren. He started yelling saying we don't know what we're doing here because we can't identify the spot that was removed in [place]. I explained to him  [facility] nor the nurse could find the location, by the information on the pathology report from [place]. Kelli didn't want to cut on him not knowing the exact location. According to [facility] and the nurse Mr Guardado was very agitated and demeaning from the beginning of the visit but go worse when they couldn't find the location.  On 07/21/20 Kelli left to get Dr Denna Haggard to see if he could locate the spot but Mr Lanzo walked out of the office mad.I explained to Mr Adrion, Menz don't wish to follow his care due to the incident that took place on 07/21/20 due to his actions. Mr Saffran kept yelling at me and told me we just don't know what we are doing here and don't know why she don't want to see him. I told Mr Marszalek he's been yelling at me the whole time he's been talking to me and Vida Roller was taken back by his actions and feels it's best he find care at another dermatology office. Mr Pullin kept yelling and said maybe Jonni Sanger will know what they are doing and hung up on me. Dr Denna Haggard walked into my office as I was documenting this and said the patient is to be dismissed from the practice. I will send a dismissal letter to the patient.

## 2020-08-25 DIAGNOSIS — F4322 Adjustment disorder with anxiety: Secondary | ICD-10-CM | POA: Diagnosis not present

## 2020-09-22 DIAGNOSIS — F419 Anxiety disorder, unspecified: Secondary | ICD-10-CM | POA: Diagnosis not present

## 2020-09-28 DIAGNOSIS — G4733 Obstructive sleep apnea (adult) (pediatric): Secondary | ICD-10-CM | POA: Diagnosis not present

## 2020-09-28 DIAGNOSIS — E785 Hyperlipidemia, unspecified: Secondary | ICD-10-CM | POA: Diagnosis not present

## 2020-09-28 DIAGNOSIS — I25119 Atherosclerotic heart disease of native coronary artery with unspecified angina pectoris: Secondary | ICD-10-CM | POA: Diagnosis not present

## 2020-09-28 LAB — BASIC METABOLIC PANEL
BUN/Creatinine Ratio: 20 (ref 10–24)
BUN: 17 mg/dL (ref 8–27)
CO2: 25 mmol/L (ref 20–29)
Calcium: 9.8 mg/dL (ref 8.6–10.2)
Chloride: 102 mmol/L (ref 96–106)
Creatinine, Ser: 0.84 mg/dL (ref 0.76–1.27)
Glucose: 92 mg/dL (ref 65–99)
Potassium: 4.6 mmol/L (ref 3.5–5.2)
Sodium: 139 mmol/L (ref 134–144)
eGFR: 99 mL/min/{1.73_m2} (ref >59)

## 2020-09-28 LAB — LIPID PANEL
Chol/HDL Ratio: 2.5 ratio (ref 0.0–5.0)
Cholesterol, Total: 106 mg/dL (ref 100–199)
HDL: 43 mg/dL (ref >39)
LDL Chol Calc (NIH): 47 mg/dL (ref 0–99)
Triglycerides: 75 mg/dL (ref 0–149)
VLDL Cholesterol Cal: 16 mg/dL (ref 5–40)

## 2020-09-28 LAB — HEPATIC FUNCTION PANEL
ALT: 27 IU/L (ref 0–44)
AST: 24 IU/L (ref 0–40)
Albumin: 4.5 g/dL (ref 3.8–4.8)
Alkaline Phosphatase: 76 IU/L (ref 44–121)
Bilirubin Total: 0.6 mg/dL (ref 0.0–1.2)
Bilirubin, Direct: 0.2 mg/dL (ref 0.00–0.40)
Total Protein: 6.6 g/dL (ref 6.0–8.5)

## 2020-09-30 ENCOUNTER — Encounter: Payer: Self-pay | Admitting: *Deleted

## 2020-10-03 NOTE — Progress Notes (Signed)
Cardiology Office Note    Date:  10/04/2020   ID:  James [name], DOB 18-Oct-1960, MRN [id number]  PCP:  [name], MD  Cardiologist:  [name]   Chief Complaint  Patient presents with   Coronary Artery Disease    History of Present Illness:  James [name] is a 60 y.o. male with PMH of basal cell skin CA, polycythemia and hepatitis C presented with NSTEMI on 03/29/2017.  Troponin on a peak at 45.54.  Hemoglobin A1c 5.5. Total cholesterol 155, HDL 26, LDL 91, triglyceride 191.  Urgent cardiac catheterization on the same day showed 100% proximal LAD lesion treated with thrombectomy and 3.5 x 16 mm Synergy DES, EF 45%.   He was also placed on aspirin, Brilinta and high-dose statin.    He was also referred  to [name] for management of obstructive sleep apnea.  He could not tolerate full facial mask in the past, however I think he can tolerate nasal device instead. Later that same month he presented to the  hospital with chest pain on the following morning after the office visit.  Troponin was initially elevated and peaked at 2.7.  He underwent cardiac catheterization, however this showed his LAD stent was widely patent, there is mild jailing of the diagonal artery.  Retrospective, it was unclear if patient had transient partial stent thrombosis or coronary spasm at the end of the stent.  Echocardiogram was repeated which showed EF 55-60%, severe hypokinesis of the akinesis of mid anteroseptal and apical anterior wall, moderate LVH.  Ejection fraction has improved from the previous 40-45% on echo.  Low-dose nitrates were added, patient was eventually discharged on 04/11/2017.  He was admitted in February 2019 again with atypical chest and epigastric pain. He ruled out for MI. Seen by our service and pain was felt to be noncardiac.   He has also  been seen by a Cardiologist in [place]- Dr. [name]. Lisinopril discontinued due to dizziness and HA. Later nitrates also reduced  and later discontinued. He underwent a CPX in 2020 which was normal.  On follow up today he is doing very well. He denies any chest pain or SOB. He has not had to use Ntg.  Wearing nasal CPAP at night.  He  is still  following the [employer] program with diet and meditation. He is using CPAP. He exercises regularly riding his bike 8 miles, lifting weights, playing golf.  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 No concerns today.   Past Medical History:  Diagnosis Date   Acute systolic congestive heart failure (HCC)    Anemia    slight anemia with hepatitis tx   Arthritis    oa   Cancer ([place]) 4 months ago   basal cell skin cancer removed from chest area   GERD (gastroesophageal reflux disease)    Hepatitis C 04/29/2012   had savalta treatment 2 years ago, now cured   Hyperlipidemia    NSTEMI (non-ST elevated myocardial infarction) ([place]) 03/29/2017   1/19 PCI/DESx1 to mLAD, EF 45%   Palpitations    at times     Past Surgical History:  Procedure Laterality Date   ARTHROSCOPIC REPAIR ACL Right 15 years ago   CORONARY STENT INTERVENTION N/A 03/29/2017   Procedure: CORONARY STENT INTERVENTION;  Surgeon: Martinique, Shakera Ebrahimi M, MD;  Location: [facility];  Service: Cardiovascular;  Laterality: N/A;   CORONARY THROMBECTOMY N/A 03/29/2017   Procedure: Coronary Thrombectomy;  Surgeon: Martinique, Oaklee Sunga  M, MD;  Location: [facility];  Service: Cardiovascular;  Laterality: N/A;   LEFT HEART CATH AND CORONARY ANGIOGRAPHY N/A 03/29/2017   Procedure: LEFT HEART CATH AND CORONARY ANGIOGRAPHY;  Surgeon: Martinique, Kairon Shock M, MD;  Location: [place];  Service: Cardiovascular;  Laterality: N/A;   LEFT HEART CATH AND CORONARY ANGIOGRAPHY N/A 04/10/2017   Procedure: LEFT HEART CATH AND CORONARY ANGIOGRAPHY;  Surgeon: Martinique, Jayani Rozman M, MD;  Location: [facility];  Service: Cardiovascular;  Laterality: N/A;   TOTAL HIP ARTHROPLASTY Right 01/11/2015   Procedure: RIGHT TOTAL HIP  ARTHROPLASTY ANTERIOR APPROACH;  Surgeon: Paralee Cancel, MD;  Location: [facility];  Service: Orthopedics;  Laterality: Right;    Current Medications: Outpatient Medications Prior to Visit  Medication Sig Dispense Refill   aspirin 81 MG EC tablet TAKE 1 TABLET BY MOUTH EVERY DAY 90 tablet 3   atorvastatin (LIPITOR) 80 MG tablet TAKE 1 TABLET BY MOUTH DAILY AT 6PM 90 tablet 2   Multiple Vitamin (MULTIVITAMIN) tablet Take 1 tablet by mouth daily.     nitroGLYCERIN (NITROSTAT) 0.4 MG SL tablet PLACE 1 TABLET UNDER THE TONGUE EVERY 5 MINUTES IF NEEDED 75 tablet 3   valACYclovir (VALTREX) 500 MG tablet Take 1 tablet by mouth daily as needed.     colchicine 0.6 MG tablet Take 1 tablet (0.6 mg total) by mouth 2 (two) times daily. 90 tablet 1   No facility-administered medications prior to visit.     Allergies:   Patient has no known allergies.   Social History   Socioeconomic History   Marital status: Single    Spouse name: Not on file   Number of children: Not on file   Years of education: Not on file   Highest education level: Not on file  Occupational History   Occupation:  Financial controller: [employer]  Tobacco Use   Smoking status: Former    Types: Cigars    Quit date: 03/29/2017    Years since quitting: 3.5   Smokeless tobacco: Former   Tobacco comments:    cigar  Vaping Use   Vaping Use: Never used  Substance and Sexual Activity   Alcohol use: No   Drug use: No   Sexual activity: Not on file  Other Topics Concern   Not on file  Social History Narrative   Not on file   Social Determinants of Health   Financial Resource Strain: Not on file  Food Insecurity: Not on file  Transportation Needs: Not on file  Physical Activity: Not on file  Stress: Not on file  Social Connections: Not on file     Family History:  The patient's family history includes Alzheimer's disease in his mother; CAD in his father; Heart failure in his father.   ROS:   Please see  the history of present illness.    ROS All other systems reviewed and are negative.   PHYSICAL EXAM:   VS:  BP 132/70 (BP Location: Right Arm, Patient Position: Sitting, Cuff Size: Normal)   Pulse 61   Ht 5' 9.5" (1.765 m)   Wt 192 lb (87.1 kg)   SpO2 98%   BMI 27.95 kg/m  GENERAL:  Well appearing WM in NAD HEENT:  PERRL, EOMI, sclera are clear. Oropharynx is clear. NECK:  No jugular venous distention, carotid upstroke brisk and symmetric, no bruits, no thyromegaly or adenopathy LUNGS:  Clear to auscultation bilaterally CHEST:  Unremarkable HEART:  RRR,  PMI not displaced or sustained,S1 and S2 within normal limits, no S3, no S4: no clicks, no rubs, no murmurs ABD:  Soft, nontender. BS +, no masses or bruits. No hepatomegaly, no splenomegaly EXT:  2 + pulses throughout, no edema, no cyanosis no clubbing SKIN:  Warm and dry.  No rashes NEURO:  Alert and oriented x 3. Cranial nerves II through XII intact. PSYCH:  Cognitively intact   Wt Readings from Last 3 Encounters:  10/04/20 192 lb (87.1 kg)  07/12/20 191 lb 12.8 oz (87 kg)  04/01/20 188 lb 3.2 oz (85.4 kg)      Studies/Labs Reviewed:   EKG:  EKG is not ordered today.     Recent Labs: 09/28/2020: ALT 27; BUN 17; Creatinine, Ser 0.84; Potassium 4.6; Sodium 139   Lipid Panel    Component Value Date/Time   CHOL 106 09/28/2020 0907   TRIG 75 09/28/2020 0907   HDL 43 09/28/2020 0907   CHOLHDL 2.5 09/28/2020 0907   CHOLHDL 6.0 03/30/2017 0251   VLDL 38 03/30/2017 0251   LDLCALC 47 09/28/2020 0907   Labs dated 06/03/17: CDRP 0.48, A1c 6% Cholesterol 91, triglycerides 64, HDL 34, LDL 44.   Additional studies/ records that were reviewed today include:   Cath 03/29/2017 Conclusion      Prox LAD lesion is 100% stenosed. Post intervention, there is a 0% residual stenosis. A drug-eluting stent was successfully placed using a STENT SYNERGY DES 3.5X16. The left ventricular systolic function is normal. LV end diastolic  pressure is normal. The left ventricular ejection fraction is 45-50% by visual estimate.   1. Single vessel occlusive CAD- 100% mid LAD with collaterals 2. Mild LV dysfunction. EF 45% 3. Normal LVEDP 4. Successful aspiration thrombectomy and stenting of the mid LAD with DES    Plan: DAPT with ASA and Brilinta. IV Aggrastat for 18 hours. May be a candidate for DC Sunday if no complications. High dose statin.      Echo 03/30/2017 LV EF: 40% -   45%   Study Conclusions   - Left ventricle: Septal and apical akinesis inferior wall   hypokinesis The cavity size was mildly dilated. Wall thickness   was increased in a pattern of mild LVH. Systolic function was   mildly to moderately reduced. The estimated ejection fraction was   in the range of 40% to 45%. Left ventricular diastolic function   parameters were normal. - Atrial septum: No defect or patent foramen ovale was identified.   Cath 04/10/2017 Conclusion     Previously placed Prox LAD stent (unknown type) is widely patent. LV end diastolic pressure is normal.   1. No significant obstructive CAD. The stent in the LAD is widely patent. 2. Normal LVEDP   Plan: continue medical therapy.      ASSESSMENT:    1. Coronary artery disease involving native coronary artery of native heart with angina pectoris ([place])   2. Hyperlipidemia, unspecified hyperlipidemia type      PLAN:  In order of problems listed above:   CAD: NSTEMI with DES of the proximal LAD in January 2019.  Repeat cardiac catheterization near the end of January 2019 showed patent stent. CPX in follow up 2020 summer was normal. He is doing very well. No significant angina.  Now only on ASA and statin.  Hyperlipidemia: excellent control on statin.   Obstructive sleep apnea: Managed by [name]. On CPAP  Follow up in one year    Medication Adjustments/Labs and Tests Ordered:  Current medicines are reviewed at length with the patient today.  Concerns  regarding medicines are outlined above.  Medication changes, Labs and Tests ordered today are listed in the Patient Instructions below. There are no Patient Instructions on file for this visit.   Signed, Cyriah Childrey Martinique, MD  10/04/2020 1:34 PM    [facility] [place], [place], [place]  [address] Phone: [phone]; Fax: [phone]

## 2020-10-04 ENCOUNTER — Encounter: Payer: Self-pay | Admitting: Cardiology

## 2020-10-04 ENCOUNTER — Other Ambulatory Visit: Payer: Self-pay

## 2020-10-04 ENCOUNTER — Ambulatory Visit (INDEPENDENT_AMBULATORY_CARE_PROVIDER_SITE_OTHER): Payer: BC Managed Care – PPO | Admitting: Cardiology

## 2020-10-04 VITALS — BP 132/70 | HR 61 | Ht 69.5 in | Wt 192.0 lb

## 2020-10-04 DIAGNOSIS — E785 Hyperlipidemia, unspecified: Secondary | ICD-10-CM | POA: Diagnosis not present

## 2020-10-04 DIAGNOSIS — I25119 Atherosclerotic heart disease of native coronary artery with unspecified angina pectoris: Secondary | ICD-10-CM | POA: Diagnosis not present

## 2020-10-04 DIAGNOSIS — D45 Polycythemia vera: Secondary | ICD-10-CM | POA: Insufficient documentation

## 2020-10-06 DIAGNOSIS — F419 Anxiety disorder, unspecified: Secondary | ICD-10-CM | POA: Diagnosis not present

## 2020-10-08 DIAGNOSIS — H00015 Hordeolum externum left lower eyelid: Secondary | ICD-10-CM | POA: Diagnosis not present

## 2020-10-13 DIAGNOSIS — G4733 Obstructive sleep apnea (adult) (pediatric): Secondary | ICD-10-CM | POA: Diagnosis not present

## 2020-10-14 DIAGNOSIS — H00025 Hordeolum internum left lower eyelid: Secondary | ICD-10-CM | POA: Diagnosis not present

## 2020-10-30 DIAGNOSIS — S61231A Puncture wound without foreign body of left index finger without damage to nail, initial encounter: Secondary | ICD-10-CM | POA: Diagnosis not present

## 2020-10-30 DIAGNOSIS — W268XXA Contact with other sharp object(s), not elsewhere classified, initial encounter: Secondary | ICD-10-CM | POA: Diagnosis not present

## 2020-10-30 DIAGNOSIS — M7989 Other specified soft tissue disorders: Secondary | ICD-10-CM | POA: Diagnosis not present

## 2020-10-30 DIAGNOSIS — Z792 Long term (current) use of antibiotics: Secondary | ICD-10-CM | POA: Diagnosis not present

## 2020-11-25 DIAGNOSIS — G4733 Obstructive sleep apnea (adult) (pediatric): Secondary | ICD-10-CM | POA: Diagnosis not present

## 2020-12-23 ENCOUNTER — Telehealth: Payer: Self-pay | Admitting: Cardiovascular Disease

## 2020-12-23 NOTE — Telephone Encounter (Signed)
What problem are you experiencing? Wife states that the patient is having heart flutters while using the CPAP machine. Appt scheduled for 01/09 with [name] and he has been added to the wait list.   Who is your medical equipment company? [employer]   Please route to the sleep study assistant.

## 2020-12-27 NOTE — Telephone Encounter (Signed)
Spoke with both the patient and his wife. He had some concerns that his flutters are coming from his CPAP pressure. He assumed that the pressure is reaching the max pressure of 20 and causing his palpitations. I pulled up a download report from the airview system and it shows that his AHI is 1.0 It showed the highest his pressure has gotten was 16. So it is no where near the 20 max that it is set on. The patient was verbally given this information. Once he got this information he was satisfied that his symptoms are not coming from his CPAP machine. He stated " That's all I needed to know. I'm ok."  The wife asked me does a person have to stay on the machine for life. I told her it depends on what the cause of their OSA is. People have OSA for different reasons. They thanked me for talking to them and being so informative.

## 2021-01-12 DIAGNOSIS — F419 Anxiety disorder, unspecified: Secondary | ICD-10-CM | POA: Diagnosis not present

## 2021-01-23 ENCOUNTER — Other Ambulatory Visit: Payer: Self-pay | Admitting: Cardiology

## 2021-02-23 DIAGNOSIS — G4733 Obstructive sleep apnea (adult) (pediatric): Secondary | ICD-10-CM | POA: Diagnosis not present

## 2021-03-19 DIAGNOSIS — J069 Acute upper respiratory infection, unspecified: Secondary | ICD-10-CM | POA: Diagnosis not present

## 2021-03-20 ENCOUNTER — Encounter: Payer: Self-pay | Admitting: Cardiovascular Disease

## 2021-03-20 ENCOUNTER — Other Ambulatory Visit: Payer: Self-pay

## 2021-03-20 ENCOUNTER — Ambulatory Visit (INDEPENDENT_AMBULATORY_CARE_PROVIDER_SITE_OTHER): Payer: BC Managed Care – PPO | Admitting: Cardiovascular Disease

## 2021-03-20 VITALS — BP 140/72 | HR 69 | Ht 70.0 in | Wt 196.2 lb

## 2021-03-20 DIAGNOSIS — I214 Non-ST elevation (NSTEMI) myocardial infarction: Secondary | ICD-10-CM | POA: Diagnosis not present

## 2021-03-20 DIAGNOSIS — I2510 Atherosclerotic heart disease of native coronary artery without angina pectoris: Secondary | ICD-10-CM | POA: Diagnosis not present

## 2021-03-20 DIAGNOSIS — G4733 Obstructive sleep apnea (adult) (pediatric): Secondary | ICD-10-CM | POA: Diagnosis not present

## 2021-03-20 DIAGNOSIS — E785 Hyperlipidemia, unspecified: Secondary | ICD-10-CM | POA: Diagnosis not present

## 2021-03-20 NOTE — Progress Notes (Signed)
° °Cardiology Office Note   ° °Date:  03/24/2021  ° °ID:  James [name], DOB 06/01/1960, MRN [id number] ° °PCP:  Ross, Charles Alan, MD  °Cardiologist:  Thomas Kelly, MD  ° °One year F/U sleep evaluation. ° °History of Present Illness:  °James [name] is a 60 y.o. male who is status post a recent PCI to a totally occluded LAD.  He was referred for sleep evaluation and I initially saw him in April 11, 2017.   ° °James [name] has a history of obstructive sleep apnea which was initially diagnosed in May 2007 when he underwent a nocturnal polysomnogram which was interpreted by Dr. Young.  At that time, he had severe sleep apnea with an AHI of 45.9 per hour; random AHI was 43.1 per hour.  He had very loud snoring with oxygen desaturation to a nadir of 72%.  Apparently, he was never started on CPAP therapy, but at some point had an oral appliance customized which he had worn intermittently but had not used in some time.  He exercises frequently and works out regularly at sport time.  On January 18 while doing a cardiac workout.  He began to notice substernal chest tightness.  He saw his PCP and was told to take antacids.  Cover laboratory was drawn and he was later notified that troponins were elevated.  He presented to the hospital and underwent cardiac catheterization by Dr. Jordan on January 18 in the early evening and was found to have total mid LAD occlusion which was successfully stented.  He was seen on 04/09/2017 by James [name] in the office follow-up and at that time due to his poor sleep.  He was advised to undergo a sleep evaluation and was scheduled to see me today.  However, later that night he again experienced recurrent chest pain, was rehospitalized and underwent repeat cardiac catheterization yesterday which revealed a widely patent stent.  He was discharged early this morning and presented to the office for further evaluation with me later that day. ° °When I saw him he was having anxiety some  anxiety following his heart attack.  He is concerned that he needs to get his sleep apnea treated as soon as possible.  An echo Doppler study on 03/30/2017 showed an EF of 40-45%.  There was mild LVH.  There was septal and apical akinesis. .  He admits to slowing awakening 1-2 times per night, nonrestorative sleep, and daytime sleepiness.  An Epworth Sleepiness Scale score was calculated in the office  which endorsed at 17 and was consistent with excessive daytime sleepiness as shown below: ° °Epworth Sleepiness Scale: °Situation   Chance of Dozing/Sleeping (0 = never , 1 = slight chance , 2 = moderate chance , 3 = high chance )  ° sitting and reading 3  ° watching TV 3  ° sitting inactive in a public place 3  ° being a passenger in a motor vehicle for an hour or more 1  ° lying down in the afternoon 3  ° sitting and talking to someone 0  ° sitting quietly after lunch (no alcohol) 3  ° while stopped for a few minutes in traffic as the driver 1  ° Total Score  17  ° ° °When I initially saw him, and I spent a considerable amount of time with him reviewing his hospitalization as well as his reason to reinitiate CPAP therapy.  Since I saw him, we had numerous conversations with insurance company   insurance company he would denied his follow-up sleep study.  Ultimately after long discussions the insurance company agreed for him to have AutoPap titration at home.  I saw him for follow-up evaluation in April 2021.  His set up date via [employer] as his DME company was May 16, 2017.  I reviewed a download from June 01, 2017 through June 30, 2017 which revealed that he did not start using CPAP therapy until April 9.  He used it every day for 13 days at the end of that cycle but had significant mask leak.  I then obtain a new download from April 1 through July 09, 2017 which again demonstrates 13 stretch where he used therapy but he has not used CPAP since April 21.  As result he is not compliant presently with usage days at only 43% with  average use is at 5 hours and 16 minutes.  He has been participating in a weight loss program and has lost 12 pounds.  He is not lifting for mass as he had in the past and he believes he is lost some of his pectoral size.  He believes the reduction of this may accommodate for his need for CPAP use.   I saw him in April 2021.  Since his prior evaluation in 2019 he has spent half the time in [place] and half the time in [place].  [employer] is now his DME company.  He admitted to excellent compliance with CPAP and once he had gotten used to it has used it every night.  Download was obtained from April 23, 2019 through May 22, 2019.  He essentially used it all nights.  However sleep duration is suboptimal with median usage on days used at only 4 hours and 4 minutes.  He states that he does not sleep very long.  His AutoSet unit is set at a minimum pressure of 5 with maximum pressure of 20.  AHI 0.6.  His 95th percentile pressure is 11.7.  He is sleeping well.  He has had purposeful weight loss and admits to a 15 pound weight loss.  He has changed his diet and basically has a plant-based diet.  He continues to exercise regularly at the gym.  He denied any recurrent anginal symptomatology.  He continued to be on atorvastatin 80 mg for hyperlipidemia.  He is on aspirin.  During that evaluation, his Epworth scale endorsed at 10, being slightly increased most likely due to inadequate sleep duration.  His blood pressure was excellent.  I last saw him on Jul 12, 2020.  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  He has seen [name] for cardiology follow-up and has not experienced any recurrent anginal symptomatology.  His blood pressure has been stable.  He continues to be on a plant-based diet.  He continues to use CPAP.  A download was obtained from April 3 through Jul 12, 2019 which confirms excellent compliance.  Again, sleep duration was only  5 hours and 40 minutes.  However AHI was excellent at 0.8.  His 95th percentile pressure was 10.7 with a maximum average pressure of 12.7.  He is unaware of breakthrough snoring.  He has been without recurrent anginal symptoms or awareness of palpitations.  His sleep is restorative.    Since his last evaluation with me he saw [name] in July 2020.  Cardiac stable.  He was following the [employer]  with diet and meditation.  He states he has consistently used his CPAP.  He typically goes to bed at midnight but often takes his mask off at 5 AM but stays in bed until 6:30 in the morning.  I obtained a download from December 8 through March 17, 2021.  Usage was 100%.  However average use was 4 hours and 55 minutes.  His pressure settings are at a range of 5 to 20 cm and his 95th percentile pressure is 12.7 with maximum average pressure at 14.4.  AHI is excellent at 1.1.  He denies any residual daytime sleepiness.  An Epworth Sleepiness Scale score was recalculated in the office today and this endorsed at 9.  He tells me he plans to take 3 trips in the upcoming year including a mission trip to [place], bike riding trip in [place] and he plans also to go to bed.  He was not planning to take his CPAP with him on these trips.  He presents for evaluation. ° °Past Medical History:  °Diagnosis Date  ° Acute systolic congestive heart failure (HCC)   ° Anemia   ° slight anemia with hepatitis tx  ° Arthritis   ° oa  ° Cancer (HCC) 4 months ago  ° basal cell skin cancer removed from chest area  ° GERD (gastroesophageal reflux disease)   ° Hepatitis C 04/29/2012  ° had savalta treatment 2 years ago, now cured  ° Hyperlipidemia   ° NSTEMI (non-ST elevated myocardial infarction) (HCC) 03/29/2017  ° 1/19 PCI/DESx1 to mLAD, EF 45%  ° Palpitations   ° at times   ° ° °Past Surgical History:  °Procedure Laterality Date  ° ARTHROSCOPIC REPAIR ACL Right 15 years ago  ° CORONARY STENT INTERVENTION N/A 03/29/2017  ° Procedure:  CORONARY STENT INTERVENTION;  Surgeon: Jordan, Peter M, MD;  Location: [facility];  Service: Cardiovascular;  Laterality: N/A;  ° CORONARY THROMBECTOMY N/A 03/29/2017  ° Procedure: Coronary Thrombectomy;  Surgeon: Jordan, Peter M, MD;  Location: [facility];  Service: Cardiovascular;  Laterality: N/A;  ° LEFT HEART CATH AND CORONARY ANGIOGRAPHY N/A 03/29/2017  ° Procedure: LEFT HEART CATH AND CORONARY ANGIOGRAPHY;  Surgeon: Jordan, Peter M, MD;  Location: [facility];  Service: Cardiovascular;  Laterality: N/A;  ° LEFT HEART CATH AND CORONARY ANGIOGRAPHY N/A 04/10/2017  ° Procedure: LEFT HEART CATH AND CORONARY ANGIOGRAPHY;  Surgeon: Jordan, Peter M, MD;  Location: [facility];  Service: Cardiovascular;  Laterality: N/A;  ° TOTAL HIP ARTHROPLASTY Right 01/11/2015  ° Procedure: RIGHT TOTAL HIP ARTHROPLASTY ANTERIOR APPROACH;  Surgeon: Matthew Olin, MD;  Location: [facility];  Service: Orthopedics;  Laterality: Right;  ° ° °Current Medications: °Outpatient Medications Prior to Visit  °Medication Sig Dispense Refill  ° amoxicillin-clavulanate (AUGMENTIN) 875-125 MG tablet Take 1 tablet by mouth 2 (two) times daily.    ° aspirin 81 MG EC tablet TAKE 1 TABLET BY MOUTH EVERY DAY 90 tablet 3  ° atorvastatin (LIPITOR) 80 MG tablet TAKE 1 TABLET BY MOUTH DAILY AT 6PM 90 tablet 2  ° Multiple Vitamin (MULTIVITAMIN) tablet Take 1 tablet by mouth daily.    ° nitroGLYCERIN (NITROSTAT) 0.4 MG SL tablet PLACE 1 TABLET UNDER THE TONGUE EVERY 5 MINUTES IF NEEDED 75 tablet 3  ° predniSONE (DELTASONE) 10 MG tablet Take 10 mg by mouth 2 (two) times daily.    ° valACYclovir (VALTREX) 500 MG tablet Take 1 tablet by mouth daily as needed.    ° °  No facility-administered medications prior to visit.  °  ° °Allergies:   Patient has no known allergies.  ° °Social History  ° °Socioeconomic History  ° Marital status: Single  °  Spouse name: Not on file  ° Number of children: Not on file  ° Years of education: Not on file  °  Highest education level: Not on file  °Occupational History  ° Occupation:  Broker  °  Employer: [employer]  °Tobacco Use  ° Smoking status: Former  °  Types: Cigars  °  Quit date: 03/29/2017  °  Years since quitting: 3.9  ° Smokeless tobacco: Former  ° Tobacco comments:  °  cigar  °Vaping Use  ° Vaping Use: Never used  °Substance and Sexual Activity  ° Alcohol use: No  ° Drug use: No  ° Sexual activity: Not on file  °Other Topics Concern  ° Not on file  °Social History Narrative  ° Not on file  ° °Social Determinants of Health  ° °Financial Resource Strain: Not on file  °Food Insecurity: Not on file  °Transportation Needs: Not on file  °Physical Activity: Not on file  °Stress: Not on file  °Social Connections: Not on file  °  °Socially he is engaged to be remarried.  He is  previously married and has 2 daughters, ages 34 and 24 and a grandchild.  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 ° ° °Family History:  The patient's family history includes Alzheimer's disease in his mother; CAD in his father; Heart failure in his father.  °His mother is alive at age 86 and father at age 85.  His father had CAD and CABG revascularization.  He has one brother and one sister. ° °ROS °General: Negative; No fevers, chills, or night sweats;  °HEENT: Negative; No changes in vision or hearing, sinus congestion, difficulty swallowing °Pulmonary: Negative; No cough, wheezing, shortness of breath, hemoptysis °Cardiovascular: Status post recent non-STEMI/secondary to LAD occlusion °GI: Negative; No nausea, vomiting, diarrhea, or abdominal pain °GU: Negative; No dysuria, hematuria, or difficulty voiding °Musculoskeletal: Negative; no myalgias, joint pain, or weakness °Hematologic/Oncology: Negative; no easy bruising, bleeding °Endocrine: Negative; no heat/cold intolerance; no diabetes °Neuro: Negative; no changes in balance, headaches °Skin: Negative; No rashes or  skin lesions °Psychiatric: Negative; No behavioral problems, depression °Sleep: Positive for severe OSA diagnosed in 2007; previously had an oral appliance.  Since reinstitution of CPAP therapy there has been marked improvement in his previous snoring, daytime sleepiness hypersomnolence.   No bruxism, restless legs, hypnogognic hallucinations, no cataplexy °Other comprehensive 14 point system review is negative. ° ° °PHYSICAL EXAM:   °VS:  BP 140/72 (BP Location: Left Arm)    Pulse 69    Ht 5' 10" (1.778 m)    Wt 196 lb 3.2 oz (89 kg)    SpO2 96%    BMI 28.15 kg/m²    ° °Repeat blood pressure by me was 114/70 ° °Wt Readings from Last 3 Encounters:  °03/20/21 196 lb 3.2 oz (89 kg)  °10/04/20 192 lb (87.1 kg)  °07/12/20 191 lb 12.8 oz (87 kg)  ° ° °General: Alert, oriented, no distress.  °Skin: normal turgor, no rashes, warm and dry °HEENT: Normocephalic, atraumatic. Pupils equal round and reactive to light; sclera anicteric; extraocular muscles intact;  °Nose without nasal septal hypertrophy °Mouth/Parynx benign; Mallinpatti scale 3 °Neck: No JVD, no carotid bruits; normal carotid upstroke °Lungs: clear to ausculatation and   percussion; no wheezing or rales °Chest wall: without tenderness to palpitation °Heart: PMI not displaced, RRR, s1 s2 normal, 1/6 systolic murmur, no diastolic murmur, no rubs, gallops, thrills, or heaves °Abdomen: soft, nontender; no hepatosplenomehaly, BS+; abdominal aorta nontender and not dilated by palpation. °Back: no CVA tenderness °Pulses 2+ °Musculoskeletal: full range of motion, normal strength, no joint deformities °Extremities: no clubbing cyanosis or edema, Homan's sign negative  °Neurologic: grossly nonfocal; Cranial nerves grossly wnl °Psychologic: Normal mood and affect ° °  °  °Studies/Labs Reviewed:  ° °March 20, 2021 ECG (independently read by me):  Sinus ryhthm at 69, PAC, normal intervals ° °Jul 12, 2020 ECG (independently read by me): Normal sinus rhythm at 70 bpm with mild  sinus arrhythmia.  Normal intervals. ° °April 2021 ECG (independently read by me): NSR at 80, isolated PAC, No STT changes; Normal intervals ° °Recent Labs: °BMP Latest Ref Rng & Units 09/28/2020 09/22/2019 01/15/2019  °Glucose 65 - 99 mg/dL 92 89 96  °BUN 8 - 27 mg/dL 17 17 16  °Creatinine 0.76 - 1.27 mg/dL 0.84 0.80 0.81  °BUN/Creat Ratio 10 - 24 20 21 20  °Sodium 134 - 144 mmol/L 139 138 140  °Potassium 3.5 - 5.2 mmol/L 4.6 4.3 4.3  °Chloride 96 - 106 mmol/L 102 103 103  °CO2 20 - 29 mmol/L 25 23 22  °Calcium 8.6 - 10.2 mg/dL 9.8 9.7 10.0  ° ° ° °Hepatic Function Latest Ref Rng & Units 09/28/2020 09/22/2019 01/15/2019  °Total Protein 6.0 - 8.5 g/dL 6.6 6.7 7.0  °Albumin 3.8 - 4.8 g/dL 4.5 4.4 4.5  °AST 0 - 40 IU/L 24 33 27  °ALT 0 - 44 IU/L 27 32 31  °Alk Phosphatase 44 - 121 IU/L 76 78 93  °Total Bilirubin 0.0 - 1.2 mg/dL 0.6 0.6 0.5  °Bilirubin, Direct 0.00 - 0.40 mg/dL 0.20 0.19 0.16  ° ° °CBC Latest Ref Rng & Units 12/30/2017 05/06/2017 04/10/2017  °WBC 3.4 - 10.8 x10E3/uL 7.9 7.8 6.9  °Hemoglobin 13.0 - 17.7 g/dL 14.3 16.0 16.7  °Hematocrit 37.5 - 51.0 % 41.5 46.8 49.4  °Platelets 150 - 450 x10E3/uL 204 202 235  ° °Lab Results  °Component Value Date  ° MCV 94 12/30/2017  ° MCV 90.3 05/06/2017  ° MCV 90.3 04/10/2017  ° °No results found for: TSH °Lab Results  °Component Value Date  ° HGBA1C 5.3 01/15/2019  ° ° ° °BNP °   °Component Value Date/Time  ° BNP 78.9 05/07/2017 0144  ° ° °ProBNP °No results found for: PROBNP ° ° °Lipid Panel  °   °Component Value Date/Time  ° CHOL 106 09/28/2020 0907  ° TRIG 75 09/28/2020 0907  ° HDL 43 09/28/2020 0907  ° CHOLHDL 2.5 09/28/2020 0907  ° CHOLHDL 6.0 03/30/2017 0251  ° VLDL 38 03/30/2017 0251  ° LDLCALC 47 09/28/2020 0907  ° ° ° °RADIOLOGY: °No results found. ° ° °Additional studies/ records that were reviewed today include:  °I reviewed the patient's remote polysomnogram of 2007.  I reviewed his most recent hospitalizations and cardiac catheterizations as well as office  note. ° °Labs were obtained today from November 26, 2017 through December 25, 2017 and more recently April 23, 2019 through May 22, 2019. ° °New download was obtained from December 8 through March 17, 2021.  AHI 1.1 with his 95th percentile pressure 12.7 and maximum average pressure 14.4. ° ° °ASSESSMENT:   ° °1. OSA (obstructive sleep apnea)   °2. NSTEMI (non-ST elevated   2. NSTEMI (non-ST elevated myocardial infarction) [facility]): March 29, 2017   3. Coronary artery disease involving native coronary artery of native heart without angina pectoris   4. Hyperlipidemia with target LDL less than 70    PLAN:  James [name] is a 61 year-old Caucasian male who developed chest pain while working out at [employer] and was found to have total occlusion of his mid LAD March 29, 2017.  He underwent delayed but successful revascularization with DES stenting.  Retrospectively, he has a history of obstructive sleep apnea documented since 2007.  At that time his sleep apnea was severe.  At that time he did not initiate CPAP therapy, but ultimately received a customized oral appliance which he had not been  wearing.  When I initially saw him in the office in 2019 he was 10 days following his acute coronary syndrome and had been readmitted for recurrent chest pain and had undergone repeat cardiac catheterization.  During that evaluation I had a very long discussion with him regarding the importance of CPAP therapy.  He had awakened from sleep at night with chest pain leading to his presentation.  Remotely he had severe sleep apnea previously documented.  There was an attempt to try to have him undergo a new sleep study but despite numerous discussions this was denied by insurance.  He ultimately underwent AutoPap titration.  At his initial evaluation, he was not compliant and I reviewed the data several downloads with him.  Subsequently, his mask was changed to a ResMed AirFit F 30 and over the past several years he has been compliant with CPAP  use.  Most recently, usage is 100% point average use is 4 hours and 55 minutes.  I again discussed with him that the preponderance of REM sleep occurs in the second half of the night.  He has been typically not putting his mask back on when he wakes up to go to the bathroom at 5 AM.  I reiterated the importance of using it throughout the night particularly since his sleep apnea is worse during REM sleep.  Current settings are excellent and his AHI is 1.1.  I had a long discussion with him regarding his travel.  I discussed with him the importance of taking his CPAP with him.  He will be going on a mission trip in [place].  However when he goes to [place] he does not feel like there will be electricity nearby.  In addition, he will be going to [place].  I discussed with him the high likelihood to develop high-altitude periodic breathing leading to central apneic events.  I strongly encouraged him to bring CPAP with him and use it as much as possible particularly with his previous significant nocturnal hypoxemia which undoubtedly contributed to his previous acute coronary syndrome.  His blood pressure today is stable.  He continues to be on atorvastatin 80 mg daily LDL less than 70.  He will follow-up with [name] for cardiology care.  I will see him in 1 year for follow-up evaluation or sooner as needed.  Medication Adjustments/Labs and Tests Ordered: Current medicines are reviewed at length with the patient today.  Concerns regarding medicines are outlined above.  Medication changes, Labs and Tests ordered today are listed in the Patient Instructions below. Patient Instructions  Medication Instructions:  The current medical regimen is effective;  continue present plan and medications as directed. Please refer to the Current Medication list given to you today.  *If you need  cardiac medications before your next appointment, please call your pharmacy* ° °Follow-Up: °Your next appointment:  12  month(s) In Person with Thomas Kelly, MD  ° °Please call our office 2 months in advance to schedule this appointment  ° °At [facility], you and your health needs are our priority.  As part of our continuing mission to provide you with exceptional heart care, we have created designated Provider Care Teams.  These Care Teams include your primary Cardiologist (physician) and Advanced Practice Providers (APPs -  Physician Assistants and Nurse Practitioners) who all work together to provide you with the care you need, when you need it. ° °  °Time spent: 25 minutes ° °Signed, °Thomas Kelly, MD  °03/24/2021 10:49 PM    °[facility] °[address], [address], [place], [place]  [address] °Phone: [phone]  °  °

## 2021-03-20 NOTE — Patient Instructions (Signed)
Medication Instructions:  The current medical regimen is effective;  continue present plan and medications as directed. Please refer to the Current Medication list given to you today.  *If you need a refill on your cardiac medications before your next appointment, please call your pharmacy*  Follow-Up: Your next appointment:  12 month(s) In Person with Shelva Majestic, MD   Please call our office 2 months in advance to schedule this appointment   At [facility], you and your health needs are our priority.  As part of our continuing mission to provide you with exceptional heart care, we have created designated Provider Care Teams.  These Care Teams include your primary Cardiologist (physician) and Advanced Practice Providers (APPs -  Physician Assistants and Nurse Practitioners) who all work together to provide you with the care you need, when you need it.

## 2021-03-24 ENCOUNTER — Encounter: Payer: Self-pay | Admitting: Cardiovascular Disease

## 2021-04-10 DIAGNOSIS — D2239 Melanocytic nevi of other parts of face: Secondary | ICD-10-CM | POA: Diagnosis not present

## 2021-04-10 DIAGNOSIS — L570 Actinic keratosis: Secondary | ICD-10-CM | POA: Diagnosis not present

## 2021-04-10 DIAGNOSIS — L821 Other seborrheic keratosis: Secondary | ICD-10-CM | POA: Diagnosis not present

## 2021-04-10 DIAGNOSIS — D2261 Melanocytic nevi of right upper limb, including shoulder: Secondary | ICD-10-CM | POA: Diagnosis not present

## 2021-04-10 DIAGNOSIS — C44519 Basal cell carcinoma of skin of other part of trunk: Secondary | ICD-10-CM | POA: Diagnosis not present

## 2021-06-01 DIAGNOSIS — Z7184 Encounter for health counseling related to travel: Secondary | ICD-10-CM | POA: Diagnosis not present

## 2021-06-01 DIAGNOSIS — Z955 Presence of coronary angioplasty implant and graft: Secondary | ICD-10-CM | POA: Diagnosis not present

## 2021-06-01 DIAGNOSIS — Z23 Encounter for immunization: Secondary | ICD-10-CM | POA: Diagnosis not present

## 2021-06-01 DIAGNOSIS — B009 Herpesviral infection, unspecified: Secondary | ICD-10-CM | POA: Diagnosis not present

## 2021-07-24 DIAGNOSIS — W57XXXA Bitten or stung by nonvenomous insect and other nonvenomous arthropods, initial encounter: Secondary | ICD-10-CM | POA: Diagnosis not present

## 2021-07-24 DIAGNOSIS — S2096XA Insect bite (nonvenomous) of unspecified parts of thorax, initial encounter: Secondary | ICD-10-CM | POA: Diagnosis not present

## 2021-07-24 DIAGNOSIS — R609 Edema, unspecified: Secondary | ICD-10-CM | POA: Diagnosis not present

## 2021-08-21 DIAGNOSIS — M109 Gout, unspecified: Secondary | ICD-10-CM | POA: Diagnosis not present

## 2021-08-25 DIAGNOSIS — M1000 Idiopathic gout, unspecified site: Secondary | ICD-10-CM | POA: Diagnosis not present

## 2021-08-30 DIAGNOSIS — F419 Anxiety disorder, unspecified: Secondary | ICD-10-CM | POA: Diagnosis not present

## 2021-09-01 DIAGNOSIS — M25562 Pain in left knee: Secondary | ICD-10-CM | POA: Diagnosis not present

## 2021-09-01 DIAGNOSIS — M25572 Pain in left ankle and joints of left foot: Secondary | ICD-10-CM | POA: Diagnosis not present

## 2021-10-03 NOTE — Progress Notes (Signed)
Cardiology Office Note    Date:  10/09/2021   ID:  James [name], DOB 1960/05/03, MRN [id number]  PCP:  [name], MD  Cardiologist:  [name]   Chief Complaint  Patient presents with   Coronary Artery Disease    History of Present Illness:  James [name] is a 61 y.o. male with PMH of basal cell skin CA, polycythemia and hepatitis C presented with NSTEMI on 03/29/2017.  Troponin on a peak at 45.54.  Hemoglobin A1c 5.5. Total cholesterol 155, HDL 26, LDL 91, triglyceride 191.  Urgent cardiac catheterization on the same day showed 100% proximal LAD lesion treated with thrombectomy and 3.5 x 16 mm Synergy DES, EF 45%.   He was also placed on aspirin, Brilinta and high-dose statin.    He was also referred  to [name] for management of obstructive sleep apnea.  He could not tolerate full facial mask in the past, however I think he can tolerate nasal device instead. Later that same month he presented to the  hospital with chest pain on the following morning after the office visit.  Troponin was initially elevated and peaked at 2.7.  He underwent cardiac catheterization, however this showed his LAD stent was widely patent, there is mild jailing of the diagonal artery.  Retrospective, it was unclear if patient had transient partial stent thrombosis or coronary spasm at the end of the stent.  Echocardiogram was repeated which showed EF 55-60%, severe hypokinesis of the akinesis of mid anteroseptal and apical anterior wall, moderate LVH.  Ejection fraction has improved from the previous 40-45% on echo.  Low-dose nitrates were added, patient was eventually discharged on 04/11/2017.  He was admitted in February 2019 again with atypical chest and epigastric pain. He ruled out for MI. Seen by our service and pain was felt to be noncardiac.   He has also  been seen by a Cardiologist in [place]- Dr. [name]. Lisinopril discontinued due to dizziness and HA. Later nitrates also reduced  and later discontinued. He underwent a CPX in 2020 which was normal.  On follow up today he reports he recently went on a 2 week motocross trip in the [place]. Was at 17K feet. Noted some palpitations with a fluttering sensation. Noted oxygen level was 88% by pulse ox at altitude. Did not take CPAP on trip. Since then palpitations have resolved. He also had mild swelling of his ankle with travel that has resolved. He has been experiencing some "angina" with tightness in his throat and chest. This is actually better with exercise. Has not taken any Ntg. He has lost 10-12 lbs.    Past Medical History:  Diagnosis Date   Acute systolic congestive heart failure (HCC)    Anemia    slight anemia with hepatitis tx   Arthritis    oa   Cancer ([place]) 4 months ago   basal cell skin cancer removed from chest area   GERD (gastroesophageal reflux disease)    Hepatitis C 04/29/2012   had savalta treatment 2 years ago, now cured   Hyperlipidemia    NSTEMI (non-ST elevated myocardial infarction) ([place]) 03/29/2017   1/19 PCI/DESx1 to mLAD, EF 45%   Palpitations    at times     Past Surgical History:  Procedure Laterality Date   ARTHROSCOPIC REPAIR ACL Right 15 years ago   CORONARY STENT INTERVENTION N/A 03/29/2017   Procedure: CORONARY STENT INTERVENTION;  Surgeon: Martinique, Michi Herrmann M, MD;  Location: [facility];  Service: Cardiovascular;  Laterality: N/A;   CORONARY THROMBECTOMY N/A 03/29/2017   Procedure: Coronary Thrombectomy;  Surgeon: Martinique, Ona Roehrs M, MD;  Location: [facility];  Service: Cardiovascular;  Laterality: N/A;   LEFT HEART CATH AND CORONARY ANGIOGRAPHY N/A 03/29/2017   Procedure: LEFT HEART CATH AND CORONARY ANGIOGRAPHY;  Surgeon: Martinique, Deavon Podgorski M, MD;  Location: [facility];  Service: Cardiovascular;  Laterality: N/A;   LEFT HEART CATH AND CORONARY ANGIOGRAPHY N/A 04/10/2017   Procedure: LEFT HEART CATH AND CORONARY ANGIOGRAPHY;  Surgeon: Martinique, Alvis Edgell M, MD;  Location: [facility];  Service: Cardiovascular;  Laterality: N/A;   TOTAL HIP ARTHROPLASTY Right 01/11/2015   Procedure: RIGHT TOTAL HIP ARTHROPLASTY ANTERIOR APPROACH;  Surgeon: Paralee Cancel, MD;  Location: [facility];  Service: Orthopedics;  Laterality: Right;    Current Medications: Outpatient Medications Prior to Visit  Medication Sig Dispense Refill   aspirin 81 MG EC tablet TAKE 1 TABLET BY MOUTH EVERY DAY 90 tablet 3   atorvastatin (LIPITOR) 80 MG tablet TAKE 1 TABLET BY MOUTH DAILY AT 6PM 90 tablet 2   Multiple Vitamin (MULTIVITAMIN) tablet Take 1 tablet by mouth daily.     nitroGLYCERIN (NITROSTAT) 0.4 MG SL tablet PLACE 1 TABLET UNDER THE TONGUE EVERY 5 MINUTES IF NEEDED 75 tablet 3   valACYclovir (VALTREX) 500 MG tablet Take 1 tablet by mouth daily as needed.     amoxicillin-clavulanate (AUGMENTIN) 875-125 MG tablet Take 1 tablet by mouth 2 (two) times daily.     predniSONE (DELTASONE) 10 MG tablet Take 10 mg by mouth 2 (two) times daily.     No facility-administered medications prior to visit.     Allergies:   Patient has no known allergies.   Social History   Socioeconomic History   Marital status: Single    Spouse name: Not on file   Number of children: Not on file   Years of education: Not on file   Highest education level: Not on file  Occupational History   Occupation:  Financial controller: [employer]  Tobacco Use   Smoking status: Former    Types: Cigars    Quit date: 03/29/2017    Years since quitting: 4.5   Smokeless tobacco: Former   Tobacco comments:    cigar  Vaping Use   Vaping Use: Never used  Substance and Sexual Activity   Alcohol use: No   Drug use: No   Sexual activity: Not on file  Other Topics Concern   Not on file  Social History Narrative   Not on file   Social Determinants of Health   Financial Resource Strain: Not on file  Food Insecurity: Not on file  Transportation Needs: Not on file  Physical Activity: Not on file   Stress: Not on file  Social Connections: Not on file     Family History:  The patient's family history includes Alzheimer's disease in his mother; CAD in his father; Heart failure in his father.   ROS:   Please see the history of present illness.    ROS All other systems reviewed and are negative.   PHYSICAL EXAM:   VS:  BP 119/72   Pulse 73   Ht '5\' 10"'$  (1.778 m)   Wt 183 lb 6.4 oz (83.2 kg)   SpO2 98%   BMI 26.32 kg/m  GENERAL:  Well appearing WM in NAD HEENT:  PERRL, EOMI, sclera are clear. Oropharynx is clear.  NECK:  No jugular venous distention, carotid upstroke brisk and symmetric, no bruits, no thyromegaly or adenopathy LUNGS:  Clear to auscultation bilaterally CHEST:  Unremarkable HEART:  RRR,  PMI not displaced or sustained,S1 and S2 within normal limits, no S3, no S4: no clicks, no rubs, no murmurs ABD:  Soft, nontender. BS +, no masses or bruits. No hepatomegaly, no splenomegaly EXT:  2 + pulses throughout, no edema, no cyanosis no clubbing SKIN:  Warm and dry.  No rashes NEURO:  Alert and oriented x 3. Cranial nerves II through XII intact. PSYCH:  Cognitively intact   Wt Readings from Last 3 Encounters:  10/09/21 183 lb 6.4 oz (83.2 kg)  03/20/21 196 lb 3.2 oz (89 kg)  10/04/20 192 lb (87.1 kg)      Studies/Labs Reviewed:   EKG:  EKG is  ordered today.  NSR rate 73. Normal. I have personally reviewed and interpreted this study.    Recent Labs: No results found for requested labs within last 365 days.   Lipid Panel    Component Value Date/Time   CHOL 106 09/28/2020 0907   TRIG 75 09/28/2020 0907   HDL 43 09/28/2020 0907   CHOLHDL 2.5 09/28/2020 0907   CHOLHDL 6.0 03/30/2017 0251   VLDL 38 03/30/2017 0251   LDLCALC 47 09/28/2020 0907   Labs dated 06/03/17: CDRP 0.48, A1c 6% Cholesterol 91, triglycerides 64, HDL 34, LDL 44.   Additional studies/ records that were reviewed today include:   Cath 03/29/2017 Conclusion      Prox LAD lesion is  100% stenosed. Post intervention, there is a 0% residual stenosis. A drug-eluting stent was successfully placed using a STENT SYNERGY DES 3.5X16. The left ventricular systolic function is normal. LV end diastolic pressure is normal. The left ventricular ejection fraction is 45-50% by visual estimate.   1. Single vessel occlusive CAD- 100% mid LAD with collaterals 2. Mild LV dysfunction. EF 45% 3. Normal LVEDP 4. Successful aspiration thrombectomy and stenting of the mid LAD with DES    Plan: DAPT with ASA and Brilinta. IV Aggrastat for 18 hours. May be a candidate for DC Sunday if no complications. High dose statin.      Echo 03/30/2017 LV EF: 40% -   45%   Study Conclusions   - Left ventricle: Septal and apical akinesis inferior wall   hypokinesis The cavity size was mildly dilated. Wall thickness   was increased in a pattern of mild LVH. Systolic function was   mildly to moderately reduced. The estimated ejection fraction was   in the range of 40% to 45%. Left ventricular diastolic function   parameters were normal. - Atrial septum: No defect or patent foramen ovale was identified.   Cath 04/10/2017 Conclusion     Previously placed Prox LAD stent (unknown type) is widely patent. LV end diastolic pressure is normal.   1. No significant obstructive CAD. The stent in the LAD is widely patent. 2. Normal LVEDP   Plan: continue medical therapy.      ASSESSMENT:    No diagnosis found.    PLAN:  In order of problems listed above:   CAD: NSTEMI with DES of the proximal LAD in January 2019.  Repeat cardiac catheterization near the end of January 2019 showed patent stent. CPX in follow up 2020 summer was normal. He is having some recurrent anginal symptoms. I have recommended a stress Myoview to assess. If abnormal he will need cardiac cath. If Brantley Fling is ok will  continue to monitor.   Now only on ASA and statin.  Hyperlipidemia:  will update labs today   Obstructive  sleep apnea: Managed by [name]. On CPAP  Follow up in one year    Medication Adjustments/Labs and Tests Ordered: Current medicines are reviewed at length with the patient today.  Concerns regarding medicines are outlined above.  Medication changes, Labs and Tests ordered today are listed in the Patient Instructions below. There are no Patient Instructions on file for this visit.   Signed, Shalin Vonbargen Martinique, MD  10/09/2021 8:10 AM    [facility] [address], [place], [place]  [address] Phone: [phone]; Fax: [phone]

## 2021-10-09 ENCOUNTER — Encounter: Payer: Self-pay | Admitting: Cardiology

## 2021-10-09 ENCOUNTER — Ambulatory Visit (INDEPENDENT_AMBULATORY_CARE_PROVIDER_SITE_OTHER): Payer: BC Managed Care – PPO | Admitting: Cardiology

## 2021-10-09 VITALS — BP 119/72 | HR 73 | Ht 70.0 in | Wt 183.4 lb

## 2021-10-09 DIAGNOSIS — E785 Hyperlipidemia, unspecified: Secondary | ICD-10-CM | POA: Diagnosis not present

## 2021-10-09 DIAGNOSIS — G4733 Obstructive sleep apnea (adult) (pediatric): Secondary | ICD-10-CM | POA: Diagnosis not present

## 2021-10-09 DIAGNOSIS — I25118 Atherosclerotic heart disease of native coronary artery with other forms of angina pectoris: Secondary | ICD-10-CM | POA: Diagnosis not present

## 2021-10-09 LAB — CBC WITH DIFFERENTIAL/PLATELET
Basophils Absolute: 0 10*3/uL (ref 0.0–0.2)
Basos: 0 %
EOS (ABSOLUTE): 0.2 10*3/uL (ref 0.0–0.4)
Eos: 2 %
Hematocrit: 46.8 % (ref 37.5–51.0)
Hemoglobin: 16.2 g/dL (ref 13.0–17.7)
Immature Grans (Abs): 0 10*3/uL (ref 0.0–0.1)
Immature Granulocytes: 0 %
Lymphocytes Absolute: 1.1 10*3/uL (ref 0.7–3.1)
Lymphs: 17 %
MCH: 33.1 pg — ABNORMAL HIGH (ref 26.6–33.0)
MCHC: 34.6 g/dL (ref 31.5–35.7)
MCV: 96 fL (ref 79–97)
Monocytes Absolute: 0.5 10*3/uL (ref 0.1–0.9)
Monocytes: 8 %
Neutrophils Absolute: 4.7 10*3/uL (ref 1.4–7.0)
Neutrophils: 73 %
Platelets: 223 10*3/uL (ref 150–450)
RBC: 4.9 x10E6/uL (ref 4.14–5.80)
RDW: 12.8 % (ref 11.6–15.4)
WBC: 6.4 10*3/uL (ref 3.4–10.8)

## 2021-10-09 LAB — BASIC METABOLIC PANEL
BUN/Creatinine Ratio: 18 (ref 10–24)
BUN: 15 mg/dL (ref 8–27)
CO2: 25 mmol/L (ref 20–29)
Calcium: 10 mg/dL (ref 8.6–10.2)
Chloride: 104 mmol/L (ref 96–106)
Creatinine, Ser: 0.82 mg/dL (ref 0.76–1.27)
Glucose: 99 mg/dL (ref 70–99)
Potassium: 4.5 mmol/L (ref 3.5–5.2)
Sodium: 141 mmol/L (ref 134–144)
eGFR: 99 mL/min/{1.73_m2} (ref >59)

## 2021-10-09 LAB — HEPATIC FUNCTION PANEL
ALT: 28 IU/L (ref 0–44)
AST: 25 IU/L (ref 0–40)
Albumin: 4.2 g/dL (ref 3.9–4.9)
Alkaline Phosphatase: 79 IU/L (ref 44–121)
Bilirubin Total: 0.6 mg/dL (ref 0.0–1.2)
Bilirubin, Direct: 0.17 mg/dL (ref 0.00–0.40)
Total Protein: 6.6 g/dL (ref 6.0–8.5)

## 2021-10-09 LAB — LIPID PANEL
Chol/HDL Ratio: 2.5 ratio (ref 0.0–5.0)
Cholesterol, Total: 111 mg/dL (ref 100–199)
HDL: 44 mg/dL (ref >39)
LDL Chol Calc (NIH): 51 mg/dL (ref 0–99)
Triglycerides: 78 mg/dL (ref 0–149)
VLDL Cholesterol Cal: 16 mg/dL (ref 5–40)

## 2021-10-09 NOTE — Patient Instructions (Signed)
Medication Instructions:  Continue same medications *If you need a refill on your cardiac medications before your next appointment, please call your pharmacy*   Lab Work: Bmet,lipid and hepatic panels,cbc today   Testing/Procedures: Stress Myoview   Follow-Up: At [facility], you and your health needs are our priority.  As part of our continuing mission to provide you with exceptional heart care, we have created designated Provider Care Teams.  These Care Teams include your primary Cardiologist (physician) and Advanced Practice Providers (APPs -  Physician Assistants and Nurse Practitioners) who all work together to provide you with the care you need, when you need it.  We recommend signing up for the patient portal called "MyChart".  Sign up information is provided on this After Visit Summary.  MyChart is used to connect with patients for Virtual Visits (Telemedicine).  Patients are able to view lab/test results, encounter notes, upcoming appointments, etc.  Non-urgent messages can be sent to your provider as well.   To learn more about what you can do with MyChart, go to [email or web address].      Your next appointment: To Be Determined after test     The format for your next appointment: Office   Provider:  Dr.Jordan

## 2021-10-10 ENCOUNTER — Ambulatory Visit (HOSPITAL_COMMUNITY)
Admission: RE | Admit: 2021-10-10 | Discharge: 2021-10-10 | Disposition: A | Discharge status: Home / Self Care | Payer: BC Managed Care – PPO | Source: Ambulatory Visit | Attending: Cardiology | Admitting: Cardiology

## 2021-10-10 DIAGNOSIS — G4733 Obstructive sleep apnea (adult) (pediatric): Secondary | ICD-10-CM | POA: Diagnosis not present

## 2021-10-10 DIAGNOSIS — E785 Hyperlipidemia, unspecified: Secondary | ICD-10-CM

## 2021-10-10 DIAGNOSIS — I25118 Atherosclerotic heart disease of native coronary artery with other forms of angina pectoris: Secondary | ICD-10-CM

## 2021-10-10 LAB — MYOCARDIAL PERFUSION IMAGING
Angina Index: 0
Duke Treadmill Score: 12
Estimated workload: 13.3
Exercise duration (min): 12 min
Exercise duration (sec): 0 s
LV dias vol: 124 mL (ref 62–150)
LV sys vol: 66 mL
MPHR: 157 {beats}/min
Nuc Stress EF: 47 %
Peak HR: 139 {beats}/min
Percent HR: 88 %
Rest HR: 69 {beats}/min
Rest Nuclear Isotope Dose: 10.8 mCi
SDS: 4
SRS: 6
SSS: 10
ST Depression (mm): 0 mm
Stress Nuclear Isotope Dose: 30.5 mCi
TID: 1.08

## 2021-10-10 MED ORDER — TECHNETIUM TC 99M TETROFOSMIN IV KIT
10.8000 | PACK | Freq: Once | INTRAVENOUS | Status: AC | PRN
  Administered 2021-10-10: 10.8 via INTRAVENOUS

## 2021-10-10 MED ORDER — TECHNETIUM TC 99M TETROFOSMIN IV KIT
30.5000 | PACK | Freq: Once | INTRAVENOUS | Status: AC | PRN
  Administered 2021-10-10: 30.5 via INTRAVENOUS

## 2021-10-18 DIAGNOSIS — L821 Other seborrheic keratosis: Secondary | ICD-10-CM | POA: Diagnosis not present

## 2021-10-18 DIAGNOSIS — Z08 Encounter for follow-up examination after completed treatment for malignant neoplasm: Secondary | ICD-10-CM | POA: Diagnosis not present

## 2021-10-18 DIAGNOSIS — C44712 Basal cell carcinoma of skin of right lower limb, including hip: Secondary | ICD-10-CM | POA: Diagnosis not present

## 2021-10-18 DIAGNOSIS — D485 Neoplasm of uncertain behavior of skin: Secondary | ICD-10-CM | POA: Diagnosis not present

## 2021-10-18 DIAGNOSIS — D1801 Hemangioma of skin and subcutaneous tissue: Secondary | ICD-10-CM | POA: Diagnosis not present

## 2021-10-18 DIAGNOSIS — L814 Other melanin hyperpigmentation: Secondary | ICD-10-CM | POA: Diagnosis not present

## 2021-10-18 DIAGNOSIS — C44519 Basal cell carcinoma of skin of other part of trunk: Secondary | ICD-10-CM | POA: Diagnosis not present

## 2021-11-01 DIAGNOSIS — B351 Tinea unguium: Secondary | ICD-10-CM | POA: Diagnosis not present

## 2021-11-01 DIAGNOSIS — M76821 Posterior tibial tendinitis, right leg: Secondary | ICD-10-CM | POA: Diagnosis not present

## 2021-11-22 DIAGNOSIS — M2141 Flat foot [pes planus] (acquired), right foot: Secondary | ICD-10-CM | POA: Diagnosis not present

## 2021-12-06 DIAGNOSIS — C44712 Basal cell carcinoma of skin of right lower limb, including hip: Secondary | ICD-10-CM | POA: Diagnosis not present

## 2021-12-20 ENCOUNTER — Encounter: Payer: Self-pay | Admitting: Cardiovascular Disease

## 2021-12-20 ENCOUNTER — Ambulatory Visit: Payer: BC Managed Care – PPO | Attending: Cardiovascular Disease | Admitting: Cardiovascular Disease

## 2021-12-20 DIAGNOSIS — I214 Non-ST elevation (NSTEMI) myocardial infarction: Secondary | ICD-10-CM

## 2021-12-20 DIAGNOSIS — I25118 Atherosclerotic heart disease of native coronary artery with other forms of angina pectoris: Secondary | ICD-10-CM | POA: Diagnosis not present

## 2021-12-20 DIAGNOSIS — G4733 Obstructive sleep apnea (adult) (pediatric): Secondary | ICD-10-CM | POA: Diagnosis not present

## 2021-12-20 DIAGNOSIS — E7800 Pure hypercholesterolemia, unspecified: Secondary | ICD-10-CM

## 2021-12-20 NOTE — Patient Instructions (Signed)
Medication Instructions:  Continue same medications *If you need a refill on your cardiac medications before your next appointment, please call your pharmacy*   Lab Work: None ordered   Testing/Procedures: None ordered   Follow-Up: At [facility], you and your health needs are our priority.  As part of our continuing mission to provide you with exceptional heart care, we have created designated Provider Care Teams.  These Care Teams include your primary Cardiologist (physician) and Advanced Practice Providers (APPs -  Physician Assistants and Nurse Practitioners) who all work together to provide you with the care you need, when you need it.  We recommend signing up for the patient portal called "MyChart".  Sign up information is provided on this After Visit Summary.  MyChart is used to connect with patients for Virtual Visits (Telemedicine).  Patients are able to view lab/test results, encounter notes, upcoming appointments, etc.  Non-urgent messages can be sent to your provider as well.   To learn more about what you can do with MyChart, go to [email or web address].    Your next appointment:  August or Sept    Call in May to scheduled appointment     The format for your next appointment: Office   Provider:  [facility]   Important Information About Sugar

## 2021-12-20 NOTE — Progress Notes (Signed)
Cardiology Office Note    Date:  12/22/2021   ID:  James [name], DOB November 12, 1960, MRN [id number]  PCP:  [name], MD  Cardiologist:  Shelva Majestic, MD   9 month  F/U sleep evaluation.  History of Present Illness:  James [name] is a 61 y.o. male who is status post a recent PCI to a totally occluded LAD.  He was referred for sleep evaluation and I initially saw him in April 11, 2017.    James [name] has a history of obstructive sleep apnea which was initially diagnosed in May 2007 when he underwent a nocturnal polysomnogram which was interpreted by Dr. Annamaria Boots.  At that time, he had severe sleep apnea with an AHI of 45.9 per hour; random AHI was 43.1 per hour.  He had very loud snoring with oxygen desaturation to a nadir of 72%.  Apparently, he was never started on CPAP therapy, but at some point had an oral appliance customized which he had worn intermittently but had not used in some time.  He exercises frequently and works out regularly at sport time.  On January 18 while doing a cardiac workout.  He began to notice substernal chest tightness.  He saw his PCP and was told to take antacids.  Cover laboratory was drawn and he was later notified that troponins were elevated.  He presented to the hospital and underwent cardiac catheterization by [name] on January 18 in the early evening and was found to have total mid LAD occlusion which was successfully stented.  He was seen on 04/09/2017 by James [name] in the office follow-up and at that time due to his poor sleep.  He was advised to undergo a sleep evaluation and was scheduled to see me today.  However, later that night he again experienced recurrent chest pain, was rehospitalized and underwent repeat cardiac catheterization yesterday which revealed a widely patent stent.  He was discharged early this morning and presented to the office for further evaluation with me later that day.  When I saw him he was having anxiety  some anxiety following his heart attack.  He is concerned that he needs to get his sleep apnea treated as soon as possible.  An echo Doppler study on 03/30/2017 showed an EF of 40-45%.  There was mild LVH.  There was septal and apical akinesis. [name]  He admits to slowing awakening 1-2 times per night, nonrestorative sleep, and daytime sleepiness.  An Epworth Sleepiness Scale score was calculated in the office  which endorsed at 17 and was consistent with excessive daytime sleepiness as shown below:  Epworth Sleepiness Scale: Situation   Chance of Dozing/Sleeping (0 = never , 1 = slight chance , 2 = moderate chance , 3 = high chance )   sitting and reading 3   watching TV 3   sitting inactive in a public place 3   being a passenger in a motor vehicle for an hour or more 1   lying down in the afternoon 3   sitting and talking to someone 0   sitting quietly after lunch (no alcohol) 3   while stopped for a few minutes in traffic as the driver 1   Total Score  17    When I initially saw him, and I spent a considerable amount of time with him reviewing his hospitalization as well as his reason to reinitiate CPAP therapy.  Since I saw him, we  had numerous conversations with insurance company he would denied his follow-up sleep study.  Ultimately after long discussions the insurance company agreed for him to have AutoPap titration at home.  I saw him for follow-up evaluation in April 2021.  His set up date via [employer] as his DME company was May 16, 2017.  I reviewed a download from June 01, 2017 through June 30, 2017 which revealed that he did not start using CPAP therapy until April 9.  He used it every day for 13 days at the end of that cycle but had significant mask leak.  I then obtain a new download from April 1 through July 09, 2017 which again demonstrates 13 stretch where he used therapy but he has not used CPAP since April 21.  As result he is not compliant presently with usage days at only 43%  with average use is at 5 hours and 16 minutes.  He has been participating in a weight loss program and has lost 12 pounds.  He is not lifting for mass as he had in the past and he believes he is lost some of his pectoral size.  He believes the reduction of this may accommodate for his need for CPAP use.   I saw him in April 2021.  Since his prior evaluation in 2019 he has spent half the time in [place] and half the time in [place].  [employer] is now his DME company.  He admitted to excellent compliance with CPAP and once he had gotten used to it has used it every night.  Download was obtained from April 23, 2019 through May 22, 2019.  He essentially used it all nights.  However sleep duration is suboptimal with median usage on days used at only 4 hours and 4 minutes.  He states that he does not sleep very long.  His AutoSet unit is set at a minimum pressure of 5 with maximum pressure of 20.  AHI 0.6.  His 95th percentile pressure is 11.7.  He is sleeping well.  He has had purposeful weight loss and admits to a 15 pound weight loss.  He has changed his diet and basically has a plant-based diet.  He continues to exercise regularly at the gym.  He denied any recurrent anginal symptomatology.  He continued to be on atorvastatin 80 mg for hyperlipidemia.  He is on aspirin.  During that evaluation, his Epworth scale endorsed at 10, being slightly increased most likely due to inadequate sleep duration.  His blood pressure was excellent.  I saw him on Jul 12, 2020.  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  He has seen [name] for cardiology follow-up and has not experienced any recurrent anginal symptomatology.  His blood pressure has been stable.  He continues to be on a plant-based diet.  He continues to use CPAP.  A download was obtained from April 3 through Jul 12, 2019 which confirms excellent compliance.  Again, sleep duration was only  5 hours and 40 minutes.  However AHI was excellent at 0.8.  His 95th percentile pressure was 10.7 with a maximum average pressure of 12.7.  He is unaware of breakthrough snoring.  He has been without recurrent anginal symptoms or awareness of palpitations.  His sleep is restorative.    I last saw him on March 20, 2021.  He had seen [name] in July 2020 and was  cardiac stable.  He was following the [facility] program with diet and meditation.  He states he has consistently used his CPAP.  He typically goes to bed at midnight but often takes his mask off at 5 AM but stays in bed until 6:30 in the morning.  I obtained a download from December 8 through March 17, 2021.  Usage was 100%.  However average use was 4 hours and 55 minutes.  His pressure settings are at a range of 5 to 20 cm and his 95th percentile pressure is 12.7 with maximum average pressure at 14.4.  AHI is excellent at 1.1.  He denies any residual daytime sleepiness.  An Epworth Sleepiness Scale score was recalculated in the office today and this endorsed at 9.  He tells me he plans to take 3 trips in the upcoming year including a mission trip to [place], bike riding trip in [place] and he plans also to go to [place].  He was not planning to take his CPAP with him on these trips.  During that evaluation, I discussed the concern of high altitude induced periodic breathing leading to potential central events.  However he states on these trips he will not have electrical outlets for his CPAP use.  Since I last saw him, he was evaluated by [name] on October 09, 2021.  He had gone on a 2-week motocross trip and he and his mountain where his elevation had it was approximately 7000 feet.  During this time he did note increased shortness of breath, somewhat occasional palpitations and on one occasion had oxygen saturation at 88% by pulse oximetry at high altitude.  He did not take his CPAP on the trip.  He also had noticed some vague tightness symptoms  upon his return and as result he underwent a myocardial perfusion study in August 2023 which showed normal perfusion with excellent exercise tolerance.  Presently, he since returning from his trip he is back on CPAP and admits to 100% usage days.  I obtained a download from September 11 through December 19, 2021.  He typically goes to bed between 11 and 1130 and wakes up at 6 AM.  Usage however is only 4 hours and 46 minutes on average per night.  His pressure is set at a range of 5 to 20 cm.  AHI is excellent at 1.0.  His 95th percentile pressure is 12.3 with maximum average pressure at 14.0.  He denies any residual daytime sleepiness.  An Epworth Sleepiness Scale score was calculated in the office today and this endorsed at 6.  He presents for evaluation.  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   GERD (gastroesophageal reflux disease)    Hepatitis C 04/29/2012   had savalta treatment 2 years ago, now cured   Hyperlipidemia    NSTEMI (non-ST elevated myocardial infarction) ([place]) 03/29/2017   1/19 PCI/DESx1 to mLAD, EF 45%   Palpitations    at times     Past Surgical History:  Procedure Laterality Date   ARTHROSCOPIC REPAIR ACL Right 15 years ago   CORONARY STENT INTERVENTION N/A 03/29/2017   Procedure: CORONARY STENT INTERVENTION;  Surgeon: Martinique, Peter M, MD;  Location: [facility];  Service: Cardiovascular;  Laterality:  N/A;   CORONARY THROMBECTOMY N/A 03/29/2017   Procedure: Coronary Thrombectomy;  Surgeon: Martinique, Peter M, MD;  Location: [facility];  Service: Cardiovascular;  Laterality: N/A;   LEFT HEART CATH AND CORONARY ANGIOGRAPHY N/A 03/29/2017   Procedure: LEFT HEART CATH AND CORONARY ANGIOGRAPHY;  Surgeon: Martinique, Peter M, MD;  Location: [facility];  Service:  Cardiovascular;  Laterality: N/A;   LEFT HEART CATH AND CORONARY ANGIOGRAPHY N/A 04/10/2017   Procedure: LEFT HEART CATH AND CORONARY ANGIOGRAPHY;  Surgeon: Martinique, Peter M, MD;  Location: [facility];  Service: Cardiovascular;  Laterality: N/A;   TOTAL HIP ARTHROPLASTY Right 01/11/2015   Procedure: RIGHT TOTAL HIP ARTHROPLASTY ANTERIOR APPROACH;  Surgeon: Paralee Cancel, MD;  Location: [facility];  Service: Orthopedics;  Laterality: Right;    Current Medications: Outpatient Medications Prior to Visit  Medication Sig Dispense Refill   aspirin 81 MG EC tablet TAKE 1 TABLET BY MOUTH EVERY DAY 90 tablet 3   atorvastatin (LIPITOR) 80 MG tablet TAKE 1 TABLET BY MOUTH DAILY AT 6PM 90 tablet 2   Multiple Vitamin (MULTIVITAMIN) tablet Take 1 tablet by mouth daily.     nitroGLYCERIN (NITROSTAT) 0.4 MG SL tablet PLACE 1 TABLET UNDER THE TONGUE EVERY 5 MINUTES IF NEEDED 75 tablet 3   valACYclovir (VALTREX) 500 MG tablet Take 1 tablet by mouth daily as needed.     No facility-administered medications prior to visit.     Allergies:   Patient has no known allergies.   Social History   Socioeconomic History   Marital status: Single    Spouse name: Not on file   Number of children: Not on file   Years of education: Not on file   Highest education level: Not on file  Occupational History   Occupation:  Financial controller: [employer]  Tobacco Use   Smoking status: Former    Types: Cigars    Quit date: 03/29/2017    Years since quitting: 4.7   Smokeless tobacco: Former   Tobacco comments:    cigar  Vaping Use   Vaping Use: Never used  Substance and Sexual Activity   Alcohol use: No   Drug use: No   Sexual activity: Not on file  Other Topics Concern   Not on file  Social History Narrative   Not on file   Social Determinants of Health   Financial Resource Strain: Not on file  Food Insecurity: Not on file  Transportation Needs: Not on file  Physical Activity: Not on  file  Stress: Not on file  Social Connections: Not on file    Socially he is engaged to be remarried.  He is  previously married and has 2 daughters, ages 18 and 57 and a grandchild.  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   Family History:  The patient's family history includes Alzheimer's disease in his mother; CAD in his father; Heart failure in his father.  His mother is alive at age 42 and father at age 59.  His father had CAD and CABG revascularization.  He has one brother and one sister.  ROS General: Negative; No fevers, chills, or night sweats;  HEENT: Negative; No changes in vision or hearing, sinus congestion, difficulty swallowing Pulmonary: Negative; No cough, wheezing, shortness of breath, hemoptysis Cardiovascular: Status post  non-STEMI/secondary to LAD occlusion GI: Negative; No nausea, vomiting, diarrhea, or abdominal pain GU: Negative; No  dysuria, hematuria, or difficulty voiding Musculoskeletal: Negative; no myalgias, joint pain, or weakness Hematologic/Oncology: Negative; no easy bruising, bleeding Endocrine: Negative; no heat/cold intolerance; no diabetes Neuro: Negative; no changes in balance, headaches Skin: Negative; No rashes or skin lesions Psychiatric: Negative; No behavioral problems, depression Sleep: Positive for severe OSA diagnosed in 2007; previously had an oral appliance.  Since reinstitution of CPAP therapy there has been marked improvement in his previous snoring, daytime sleepiness hypersomnolence.   No bruxism, restless legs, hypnogognic hallucinations, no cataplexy Other comprehensive 14 point system review is negative.   PHYSICAL EXAM:   VS:  BP 108/72   Pulse 72   Ht 5' 10"  (1.778 m)   Wt 193 lb 9.6 oz (87.8 kg)   SpO2 99%   BMI 27.78 kg/m     Repeat blood pressure by me was 120/70  Wt Readings from Last 3 Encounters:  12/20/21 193 lb 9.6 oz (87.8 kg)  10/10/21 183 lb (83 kg)   10/09/21 183 lb 6.4 oz (83.2 kg)   General: Alert, oriented, no distress.  Skin: normal turgor, no rashes, warm and dry HEENT: Normocephalic, atraumatic. Pupils equal round and reactive to light; sclera anicteric; extraocular muscles intact;  Nose without nasal septal hypertrophy Mouth/Parynx benign; Mallinpatti scale 3 Neck: No JVD, no carotid bruits; normal carotid upstroke Lungs: clear to ausculatation and percussion; no wheezing or rales Chest wall: without tenderness to palpitation Heart: PMI not displaced, RRR, s1 s2 normal, 1/6 systolic murmur, no diastolic murmur, no rubs, gallops, thrills, or heaves Abdomen: soft, nontender; no hepatosplenomehaly, BS+; abdominal aorta nontender and not dilated by palpation. Back: no CVA tenderness Pulses 2+ Musculoskeletal: full range of motion, normal strength, no joint deformities Extremities: no clubbing cyanosis or edema, Homan's sign negative  Neurologic: grossly nonfocal; Cranial nerves grossly wnl Psychologic: Normal mood and affect     Studies/Labs Reviewed:   December 20, 2021 ECG (independently read by me): NSR at 72, no ectopy, normal intervas  March 20, 2021 ECG (independently read by me):  Sinus ryhthm at 69, PAC, normal intervals  Jul 12, 2020 ECG (independently read by me): Normal sinus rhythm at 70 bpm with mild sinus arrhythmia.  Normal intervals.  April 2021 ECG (independently read by me): NSR at 80, isolated PAC, No STT changes; Normal intervals  Recent Labs:    Latest Ref Rng & Units 10/09/2021    8:41 AM 09/28/2020    9:07 AM 09/22/2019    8:32 AM  BMP  Glucose 70 - 99 mg/dL 99  92  89   BUN 8 - 27 mg/dL 15  17  17    Creatinine 0.76 - 1.27 mg/dL 0.82  0.84  0.80   BUN/Creat Ratio 10 - 24 18  20  21    Sodium 134 - 144 mmol/L 141  139  138   Potassium 3.5 - 5.2 mmol/L 4.5  4.6  4.3   Chloride 96 - 106 mmol/L 104  102  103   CO2 20 - 29 mmol/L 25  25  23    Calcium 8.6 - 10.2 mg/dL 10.0  9.8  9.7          Latest Ref Rng & Units 10/09/2021    8:41 AM 09/28/2020    9:07 AM 09/22/2019    8:32 AM  Hepatic Function  Total Protein 6.0 - 8.5 g/dL 6.6  6.6  6.7   Albumin 3.9 - 4.9 g/dL 4.2  4.5  4.4   AST 0 - 40 IU/L 25  24  33   ALT 0 - 44 IU/L 28  27  32   Alk Phosphatase 44 - 121 IU/L 79  76  78   Total Bilirubin 0.0 - 1.2 mg/dL 0.6  0.6  0.6   Bilirubin, Direct 0.00 - 0.40 mg/dL 0.17  0.20  0.19        Latest Ref Rng & Units 10/09/2021    8:41 AM 12/30/2017    9:58 AM 05/06/2017    5:14 PM  CBC  WBC 3.4 - 10.8 x10E3/uL 6.4  7.9  7.8   Hemoglobin 13.0 - 17.7 g/dL 16.2  14.3  16.0   Hematocrit 37.5 - 51.0 % 46.8  41.5  46.8   Platelets 150 - 450 x10E3/uL 223  204  202    Lab Results  Component Value Date   MCV 96 10/09/2021   MCV 94 12/30/2017   MCV 90.3 05/06/2017   No results found for: "TSH" Lab Results  Component Value Date   HGBA1C 5.3 01/15/2019     BNP    Component Value Date/Time   BNP 78.9 05/07/2017 0144    ProBNP No results found for: "PROBNP"   Lipid Panel     Component Value Date/Time   CHOL 111 10/09/2021 0841   TRIG 78 10/09/2021 0841   HDL 44 10/09/2021 0841   CHOLHDL 2.5 10/09/2021 0841   CHOLHDL 6.0 03/30/2017 0251   VLDL 38 03/30/2017 0251   LDLCALC 51 10/09/2021 0841     RADIOLOGY: No results found.   Additional studies/ records that were reviewed today include:  I reviewed the patient's remote polysomnogram of 2007.  I reviewed his most recent hospitalizations and cardiac catheterizations as well as office note.  Labs were obtained today from November 26, 2017 through December 25, 2017 and more recently April 23, 2019 through May 22, 2019.  New download was obtained from December 8 through March 17, 2021.  AHI 1.1 with his 95th percentile pressure 12.7 and maximum average pressure 14.4.   ASSESSMENT:    1. OSA on CPAP   2. NSTEMI (non-ST elevated myocardial infarction) [facility]): March 29, 2017   3. Coronary artery disease of  native artery of native heart with stable angina pectoris ([place])   4. Pure hypercholesterolemia     PLAN:  James [name] is a 61 year-old Caucasian male who developed chest pain while working out at [employer] and was found to have total occlusion of his mid LAD March 29, 2017.  He underwent delayed but successful revascularization with DES stenting.  Retrospectively, he has a history of obstructive sleep apnea documented since 2007.  At that time his sleep apnea was severe.  At that time he did not initiate CPAP therapy, but ultimately received a customized oral appliance which he had not been  wearing.  When I initially saw him in the office in 2019 he was 10 days following his acute coronary syndrome and had been readmitted for recurrent chest pain and had undergone repeat cardiac catheterization.  During that evaluation I had a very long discussion with him regarding the importance of CPAP therapy.  He had awakened from sleep at night with chest pain leading to his presentation.  Remotely he had severe sleep apnea previously documented.  There was an attempt to try to have him undergo a new sleep study but despite numerous discussions this was denied by insurance.  He ultimately underwent AutoPap titration.  At his initial evaluation, he was not compliant and  I reviewed the data several downloads with him.  Subsequently, his mask was changed to a ResMed AirFit F 30 and over the past several years he has been compliant with CPAP use; however usage duration per night has been suboptimal and typically has been less than 5 hours.  At his prior evaluations, I had discussed that the preponderance of REM sleep occurs in the second half of the night.  He was typically not putting his mask back on when he wakes up to go to the bathroom at 5 AM.  I reiterated the importance of using it throughout the night particularly since his sleep apnea is worse during REM sleep.  Current settings are excellent and his AHI is  1.1.  I had a long discussion with him regarding his travel.  I discussed with him the importance of taking his CPAP with him.  Since his last evaluation, he took a motor cross trip to the [facility] and reached an altitude of 7000 feet above sea level.  He admitted to some increased shortness of breath.  He did not have his CPAP with him.  His sleep was not as good.  I had a lengthy discussion again with him today and discussed high altitude induced central apnea contributed by hyperventilation mediated drop in carbon dioxide below the threshold contributing to central sleep apneic events.  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  Presently, he is using CPAP with daily compliance.  I again reiterated the importance of ideal sleep duration for at least 7 to 9 hours and that he should use his CPAP for the nights entirety.  On his initial sleep evaluation in 2007 he had very loud snoring with O2 desaturation to 72%.  Clinically he is doing well.  Blood pressure is stable.  I will change his CPAP parameter to a range of 8-18 rather than 5 to 20 cm of water.  He continues to be on atorvastatin 80 mg for hyperlipidemia with LDL cholesterol on October 09, 2021 at 109.  He will follow-up with [name].  I will see him in 1 year for follow-up sleep evaluation.   Medication Adjustments/Labs and Tests Ordered: Current medicines are reviewed at length with the patient today.  Concerns regarding medicines are outlined above.  Medication changes, Labs and Tests ordered today are listed in the Patient Instructions below. Patient Instructions  Medication Instructions:  Continue same medications *If you need a refill on your cardiac medications before your next appointment, please call your pharmacy*   Lab Work: None ordered   Testing/Procedures: None ordered   Follow-Up: At [facility], you and your health needs are our priority.  As part of our continuing mission to provide  you with exceptional heart care, we have created designated Provider Care Teams.  These Care Teams include your primary Cardiologist (physician) and Advanced Practice Providers (APPs -  Physician Assistants and Nurse Practitioners) who all work together to provide you with the care you need, when you need it.  We recommend signing up for the patient portal called "MyChart".  Sign up information is provided on this After Visit Summary.  MyChart is used to connect with patients for Virtual Visits (Telemedicine).  Patients are able to view lab/test results, encounter notes, upcoming appointments, etc.  Non-urgent messages can be sent to your provider as well.   To learn more about what you can do with MyChart, go to [email or web address].    Your next appointment:  August or  Sept    Call in May to scheduled appointment     The format for your next appointment: Office   Provider:  [facility]   Important Information About Sugar          Signed, Shelva Majestic, MD , [facility] [place], American Board of Sleep Medicine  12/22/2021 [facility] [place] [address] [place], [place], [place]  [address] Phone: [phone]

## 2021-12-21 DIAGNOSIS — D485 Neoplasm of uncertain behavior of skin: Secondary | ICD-10-CM | POA: Diagnosis not present

## 2021-12-21 DIAGNOSIS — C44519 Basal cell carcinoma of skin of other part of trunk: Secondary | ICD-10-CM | POA: Diagnosis not present

## 2021-12-22 ENCOUNTER — Encounter: Payer: Self-pay | Admitting: Cardiovascular Disease

## 2022-01-09 DIAGNOSIS — G4733 Obstructive sleep apnea (adult) (pediatric): Secondary | ICD-10-CM | POA: Diagnosis not present

## 2022-01-25 DIAGNOSIS — M2141 Flat foot [pes planus] (acquired), right foot: Secondary | ICD-10-CM | POA: Diagnosis not present

## 2022-02-08 DIAGNOSIS — M2141 Flat foot [pes planus] (acquired), right foot: Secondary | ICD-10-CM | POA: Diagnosis not present

## 2022-02-08 DIAGNOSIS — G4733 Obstructive sleep apnea (adult) (pediatric): Secondary | ICD-10-CM | POA: Diagnosis not present

## 2022-02-27 DIAGNOSIS — M25532 Pain in left wrist: Secondary | ICD-10-CM | POA: Diagnosis not present

## 2022-02-27 DIAGNOSIS — M25531 Pain in right wrist: Secondary | ICD-10-CM | POA: Diagnosis not present

## 2022-03-07 DIAGNOSIS — L853 Xerosis cutis: Secondary | ICD-10-CM | POA: Diagnosis not present

## 2022-03-11 DIAGNOSIS — G4733 Obstructive sleep apnea (adult) (pediatric): Secondary | ICD-10-CM | POA: Diagnosis not present

## 2022-03-20 ENCOUNTER — Telehealth: Payer: Self-pay

## 2022-03-20 DIAGNOSIS — M25531 Pain in right wrist: Secondary | ICD-10-CM | POA: Diagnosis not present

## 2022-03-20 DIAGNOSIS — M25532 Pain in left wrist: Secondary | ICD-10-CM | POA: Diagnosis not present

## 2022-03-20 NOTE — Telephone Encounter (Signed)
Pt walks into clinic today after having an appointment at [facility] with complaints of issues with carpel tunnel. Pt has been receiving cortisone and prednisone shots in his hand. Pt would like to know if this is ok for him to continue doing. Pt did receive a shot today. Pt spoke with Amber at our front desk. Advised pt that we would send message to [name] and give him a call with results. Pt verbalizes understanding.

## 2022-03-21 DIAGNOSIS — M2141 Flat foot [pes planus] (acquired), right foot: Secondary | ICD-10-CM | POA: Diagnosis not present

## 2022-03-21 NOTE — Telephone Encounter (Signed)
Spoke to patient's wife Dr.Jordan's advice given.

## 2022-03-23 ENCOUNTER — Telehealth: Payer: Self-pay | Admitting: Cardiology

## 2022-03-23 MED ORDER — NITROGLYCERIN 0.4 MG SL SUBL: SUBLINGUAL_TABLET | SUBLINGUAL | 3 refills | Status: DC

## 2022-03-23 NOTE — Telephone Encounter (Signed)
*  STAT* If patient is at the pharmacy, call can be transferred to refill team.   1. Which medications need to be refilled? (please list name of each medication and dose if known)   nitroGLYCERIN (NITROSTAT) 0.4 MG SL tablet   2. Which pharmacy/location (including street and city if local pharmacy) is medication to be sent to?  [id number]- [place], [place] - [address]  3. Do they need a 30 day or 90 day supply?   30 day  Wife stated the patient is completely out of this medication.

## 2022-04-19 ENCOUNTER — Telehealth: Payer: Self-pay | Admitting: Cardiovascular Disease

## 2022-04-19 NOTE — Telephone Encounter (Signed)
Ok to take Mucinex and benzonatate, no issues with his other meds.

## 2022-04-19 NOTE — Telephone Encounter (Signed)
Wife informed of PharmD response: "Ok to take Mucinex and benzonatate, no issues with his other meds." She verbalized understanding.

## 2022-04-19 NOTE — Telephone Encounter (Signed)
Wife called to check if the patient can take Mucinex and Benzonate "Pearls" as he has a heart condition for cough due cold.

## 2022-04-24 DIAGNOSIS — L814 Other melanin hyperpigmentation: Secondary | ICD-10-CM | POA: Diagnosis not present

## 2022-04-24 DIAGNOSIS — L02223 Furuncle of chest wall: Secondary | ICD-10-CM | POA: Diagnosis not present

## 2022-04-24 DIAGNOSIS — D1801 Hemangioma of skin and subcutaneous tissue: Secondary | ICD-10-CM | POA: Diagnosis not present

## 2022-04-24 DIAGNOSIS — L821 Other seborrheic keratosis: Secondary | ICD-10-CM | POA: Diagnosis not present

## 2022-05-01 NOTE — Progress Notes (Signed)
Cardiology Office Note    Date:  05/07/2022   ID:  James [name], DOB [birth year], MRN [id number]  PCP:  James Cruel, [name]  Cardiologist:  [name]   Chief Complaint  Patient presents with   Coronary Artery Disease    History of Present Illness:  James [name] is a 62 y.o. male with PMH of basal cell skin CA, polycythemia and hepatitis C presented with NSTEMI on 03/29/2017.  Troponin on a peak at 45.54.  Hemoglobin A1c 5.5. Total cholesterol 155, HDL 26, LDL 91, triglyceride 191.  Urgent cardiac catheterization on the same day showed 100% proximal LAD lesion treated with thrombectomy and 3.5 x 16 mm Synergy DES, EF 45%.   He was also placed on aspirin, Brilinta and high-dose statin.    He was also referred  to [name] for management of obstructive sleep apnea.  He could not tolerate full facial mask in the past, however I think he can tolerate nasal device instead. Later that same month he presented to the  hospital with chest pain on the following morning after the office visit.  Troponin was initially elevated and peaked at 2.7.  He underwent cardiac catheterization, however this showed his LAD stent was widely patent, there is mild jailing of the diagonal artery.  Retrospective, it was unclear if patient had transient partial stent thrombosis or coronary spasm at the end of the stent.  Echocardiogram was repeated which showed EF 55-60%, severe hypokinesis of the akinesis of mid anteroseptal and apical anterior wall, moderate LVH.  Ejection fraction has improved from the previous 40-45% on echo.  Low-dose nitrates were added, patient was eventually discharged on 04/11/2017.  He was admitted in February 2019 again with atypical chest and epigastric pain. He ruled out for MI. Seen by our service and pain was felt to be noncardiac.   He has also  been seen by a Cardiologist in [place]- Dr. [name]. Lisinopril discontinued due to dizziness and HA. Later nitrates also reduced  and later discontinued. He underwent a CPX in 2020 which was normal.  When seen in July  he  went [place] 2 week mot[place]. Was at 17K feet. Noted some palpitations with a fluttering sensation. Noted oxygen level was 88% by pulse ox at altitude. Did not take CPAP on trip. Since then palpitations have resolved. He also had mild swelling of his ankle with travel that has resolved. He has been experiencing some "angina" with tightness in his throat and chest. This is actually better with exercise. We performed a Myoview study that showed normal perfusion at excellent exercise level.   He has done very well since then. Exercising well. Very rare angina. No dyspnea. No palpitations.    Past Medical History:  Diagnosis Date   Acute systolic congestive heart failure (HCC)    Anemia    slight anemia with hepatitis tx   Arthritis    oa   Cancer ([place]) 4 months ago   basal cell skin cancer removed from chest area   GERD (gastroesophageal reflux disease)    Hepatitis C 04/29/2012   had savalta treatment 2 years ago, now cured   Hyperlipidemia    NSTEMI (non-ST elevated myocardial infarction) ([place]) 03/29/2017   1/19 PCI/DESx1 to mLAD, EF 45%   Palpitations    at times     Past Surgical History:  Procedure Laterality Date   ARTHROSCOPIC REPAIR ACL Right 15 years ago   CORONARY STENT  INTERVENTION N/A 03/29/2017   Procedure: CORONARY STENT INTERVENTION;  Surgeon: Martinique, James Sigler M, [name];  Location: [facility];  Service: Cardiovascular;  Laterality: N/A;   CORONARY THROMBECTOMY N/A 03/29/2017   Procedure: Coronary Thrombectomy;  Surgeon: Martinique, James Fairbairn M, [name];  Location: [facility];  Service: Cardiovascular;  Laterality: N/A;   LEFT HEART CATH AND CORONARY ANGIOGRAPHY N/A 03/29/2017   Procedure: LEFT HEART CATH AND CORONARY ANGIOGRAPHY;  Surgeon: Martinique, James Koenen M, [name];  Location: [facility];  Service: Cardiovascular;  Laterality: N/A;   LEFT HEART CATH AND CORONARY  ANGIOGRAPHY N/A 04/10/2017   Procedure: LEFT HEART CATH AND CORONARY ANGIOGRAPHY;  Surgeon: Martinique, James Soules M, [name];  Location: [facility];  Service: Cardiovascular;  Laterality: N/A;   TOTAL HIP ARTHROPLASTY Right 01/11/2015   Procedure: RIGHT TOTAL HIP ARTHROPLASTY ANTERIOR APPROACH;  Surgeon: James [name];  Location: [facility];  Service: Orthopedics;  Laterality: Right;    Current Medications: Outpatient Medications Prior to Visit  Medication Sig Dispense Refill   aspirin 81 MG EC tablet TAKE 1 TABLET BY MOUTH EVERY DAY 90 tablet 3   atorvastatin (LIPITOR) 80 MG tablet TAKE 1 TABLET BY MOUTH DAILY AT 6PM (Patient taking differently: Take 80 mg by mouth daily.) 90 tablet 2   Multiple Vitamin (MULTIVITAMIN) tablet Take 1 tablet by mouth daily.     valACYclovir (VALTREX) 500 MG tablet Take 1 tablet by mouth daily as needed.     nitroGLYCERIN (NITROSTAT) 0.4 MG SL tablet PLACE 1 TABLET UNDER THE TONGUE EVERY 5 MINUTES IF NEEDED 75 tablet 3   No facility-administered medications prior to visit.     Allergies:   Patient has no known allergies.   Social History   Socioeconomic History   Marital status: Single    Spouse name: Not on file   Number of children: Not on file   Years of education: Not on file   Highest education level: Not on file  Occupational History   Occupation:  Financial controller: [employer]  Tobacco Use   Smoking status: Former    Types: Cigars    Quit date: 03/29/2017    Years since quitting: 5.1   Smokeless tobacco: Former   Tobacco comments:    cigar  Vaping Use   Vaping Use: Never used  Substance and Sexual Activity   Alcohol use: No   Drug use: No   Sexual activity: Not on file  Other Topics Concern   Not on file  Social History Narrative   Not on file   Social Determinants of Health   Financial Resource Strain: Not on file  Food Insecurity: Not on file  Transportation Needs: Not on file  Physical Activity: Not on file   Stress: Not on file  Social Connections: Not on file     Family History:  The patient's family history includes Alzheimer's disease in his mother; CAD in his father; Heart failure in his father.   ROS:   Please see the history of present illness.    ROS All other systems reviewed and are negative.   PHYSICAL EXAM:   VS:  BP 122/64 (BP Location: Left Arm, Patient Position: Sitting, Cuff Size: Normal)   Pulse 85   Ht '5\' 9"'$  (1.753 m)   Wt 188 lb 12.8 oz (85.6 kg)   SpO2 97%   BMI 27.88 kg/m  GENERAL:  Well appearing WM in NAD HEENT:  PERRL, EOMI, sclera are clear.  Oropharynx is clear. NECK:  No jugular venous distention, carotid upstroke brisk and symmetric, no bruits, no thyromegaly or adenopathy LUNGS:  Clear to auscultation bilaterally CHEST:  Unremarkable HEART:  RRR,  PMI not displaced or sustained,S1 and S2 within normal limits, no S3, no S4: no clicks, no rubs, no murmurs ABD:  Soft, nontender. BS +, no masses or bruits. No hepatomegaly, no splenomegaly EXT:  2 + pulses throughout, no edema, no cyanosis no clubbing SKIN:  Warm and dry.  No rashes NEURO:  Alert and oriented x 3. Cranial nerves II through XII intact. PSYCH:  Cognitively intact   Wt Readings from Last 3 Encounters:  05/07/22 188 lb 12.8 oz (85.6 kg)  12/20/21 193 lb 9.6 oz (87.8 kg)  10/10/21 183 lb (83 kg)      Studies/Labs Reviewed:   EKG:  EKG is not ordered today.     Recent Labs: 10/09/2021: ALT 28; BUN 15; Creatinine, Ser 0.82; Hemoglobin 16.2; Platelets 223; Potassium 4.5; Sodium 141   Lipid Panel    Component Value Date/Time   CHOL 111 10/09/2021 0841   TRIG 78 10/09/2021 0841   HDL 44 10/09/2021 0841   CHOLHDL 2.5 10/09/2021 0841   CHOLHDL 6.0 03/30/2017 0251   VLDL 38 03/30/2017 0251   LDLCALC 51 10/09/2021 0841   Labs dated 06/03/17: CDRP 0.48, A1c 6% Cholesterol 91, triglycerides 64, HDL 34, LDL 44.   Additional studies/ records that were reviewed today include:   Cath  03/29/2017 Conclusion      Prox LAD lesion is 100% stenosed. Post intervention, there is a 0% residual stenosis. A drug-eluting stent was successfully placed using a STENT SYNERGY DES 3.5X16. The left ventricular systolic function is normal. LV end diastolic pressure is normal. The left ventricular ejection fraction is 45-50% by visual estimate.   1. Single vessel occlusive CAD- 100% mid LAD with collaterals 2. Mild LV dysfunction. EF 45% 3. Normal LVEDP 4. Successful aspiration thrombectomy and stenting of the mid LAD with DES    Plan: DAPT with ASA and Brilinta. IV Aggrastat for 18 hours. May be a candidate for DC Sunday if no complications. High dose statin.      Echo 03/30/2017 LV EF: 40% -   45%   Study Conclusions   - Left ventricle: Septal and apical akinesis inferior wall   hypokinesis The cavity size was mildly dilated. Wall thickness   was increased in a pattern of mild LVH. Systolic function was   mildly to moderately reduced. The estimated ejection fraction was   in the range of 40% to 45%. Left ventricular diastolic function   parameters were normal. - Atrial septum: No defect or patent foramen ovale was identified.   Cath 04/10/2017 Conclusion     Previously placed Prox LAD stent (unknown type) is widely patent. LV end diastolic pressure is normal.   1. No significant obstructive CAD. The stent in the LAD is widely patent. 2. Normal LVEDP   Plan: continue medical therapy.    Myoview 10/10/21: Study Highlights      The study is normal. The study is low risk.   No ST deviation was noted.   Left ventricular function is normal. Nuclear stress EF: 47 %. The left ventricular ejection fraction is mildly decreased (45-54%). End diastolic cavity size is normal.   Prior study not available for comparison.   Low risk stress nuclear study with normal perfusion and mildly reduced left ventricular global systolic function. Recommend correlation with  echo.  ASSESSMENT:    1. Coronary artery disease of native artery of native heart with stable angina pectoris (Early)   2. Hyperlipidemia with target LDL less than 70   3. OSA (obstructive sleep apnea)       PLAN:  In order of problems listed above:   CAD: NSTEMI with DES of the proximal LAD in January 2019.  Repeat cardiac catheterization near the end of January 2019 showed patent stent. CPX in follow up 2020 summer was normal.  Last stress Myoview in July was normal.  Continue ASA and statin.  Hyperlipidemia:  last LDL 51. At goal.   Obstructive sleep apnea: Managed by [name]. On CPAP  Follow up in one year    Medication Adjustments/Labs and Tests Ordered: Current medicines are reviewed at length with the patient today.  Concerns regarding medicines are outlined above.  Medication changes, Labs and Tests ordered today are listed in the Patient Instructions below. There are no Patient Instructions on file for this visit.   Signed, Siler Mavis Martinique, [name]  05/07/2022 8:05 AM    [facility] [address], [place], [place]  [address] Phone: [phone]; Fax: [phone]

## 2022-05-07 ENCOUNTER — Ambulatory Visit: Payer: BC Managed Care – PPO | Attending: Cardiology | Admitting: Cardiology

## 2022-05-07 ENCOUNTER — Encounter: Payer: Self-pay | Admitting: Cardiology

## 2022-05-07 VITALS — BP 122/64 | HR 85 | Ht 69.0 in | Wt 188.8 lb

## 2022-05-07 DIAGNOSIS — E785 Hyperlipidemia, unspecified: Secondary | ICD-10-CM | POA: Diagnosis not present

## 2022-05-07 DIAGNOSIS — G4733 Obstructive sleep apnea (adult) (pediatric): Secondary | ICD-10-CM

## 2022-05-07 DIAGNOSIS — I25118 Atherosclerotic heart disease of native coronary artery with other forms of angina pectoris: Secondary | ICD-10-CM | POA: Diagnosis not present

## 2022-05-07 MED ORDER — NITROGLYCERIN 0.4 MG SL SUBL: SUBLINGUAL_TABLET | SUBLINGUAL | 3 refills | Status: DC

## 2022-05-07 NOTE — Patient Instructions (Signed)
Medication Instructions:  Your physician recommends that you continue on your current medications as directed. Please refer to the Current Medication list given to you today.  *If you need a refill on your cardiac medications before your next appointment, please call your pharmacy*   Lab Work: Your physician recommends that you return for lab work in: 6 months for [place]  If you have labs (blood work) drawn today and your tests are completely normal, you will receive your results only by: [place] (if you have MyChart) OR A paper copy in the mail If you have any lab test that is abnormal or we need to change your treatment, we will call you to review the results.   Follow-Up: At [facility], you and your health needs are our priority.  As part of our continuing mission to provide you with exceptional heart care, we have created designated Provider Care Teams.  These Care Teams include your primary Cardiologist (physician) and Advanced Practice Providers (APPs -  Physician Assistants and Nurse Practitioners) who all work together to provide you with the care you need, when you need it.  We recommend signing up for the patient portal called "MyChart".  Sign up information is provided on this After Visit Summary.  MyChart is used to connect with patients for Virtual Visits (Telemedicine).  Patients are able to view lab/test results, encounter notes, upcoming appointments, etc.  Non-urgent messages can be sent to your provider as well.   To learn more about what you can do with MyChart, go to [email or web address].    Your next appointment:   6 month(s)  Provider:   [name], MD

## 2022-05-08 DIAGNOSIS — L309 Dermatitis, unspecified: Secondary | ICD-10-CM | POA: Diagnosis not present

## 2022-05-24 DIAGNOSIS — M1712 Unilateral primary osteoarthritis, left knee: Secondary | ICD-10-CM | POA: Diagnosis not present

## 2022-06-14 ENCOUNTER — Telehealth: Payer: Self-pay | Admitting: Cardiology

## 2022-06-14 DIAGNOSIS — L2089 Other atopic dermatitis: Secondary | ICD-10-CM | POA: Diagnosis not present

## 2022-06-14 MED ORDER — ATORVASTATIN CALCIUM 80 MG PO TABS: 80.0000 mg | ORAL_TABLET | Freq: Every day | ORAL | 3 refills | Status: DC

## 2022-06-14 NOTE — Telephone Encounter (Signed)
Patient came into office regarding refill on LIPITOR. Patient stated that pharmacy needs a new prescription sent - due to previous one being expired.

## 2022-07-10 DIAGNOSIS — D485 Neoplasm of uncertain behavior of skin: Secondary | ICD-10-CM | POA: Diagnosis not present

## 2022-07-10 DIAGNOSIS — L308 Other specified dermatitis: Secondary | ICD-10-CM | POA: Diagnosis not present

## 2022-07-10 DIAGNOSIS — L814 Other melanin hyperpigmentation: Secondary | ICD-10-CM | POA: Diagnosis not present

## 2022-07-10 DIAGNOSIS — L309 Dermatitis, unspecified: Secondary | ICD-10-CM | POA: Diagnosis not present

## 2022-07-10 DIAGNOSIS — L821 Other seborrheic keratosis: Secondary | ICD-10-CM | POA: Diagnosis not present

## 2022-07-10 DIAGNOSIS — D1801 Hemangioma of skin and subcutaneous tissue: Secondary | ICD-10-CM | POA: Diagnosis not present

## 2022-07-12 ENCOUNTER — Telehealth: Payer: Self-pay | Admitting: *Deleted

## 2022-07-12 DIAGNOSIS — M25531 Pain in right wrist: Secondary | ICD-10-CM | POA: Diagnosis not present

## 2022-07-12 DIAGNOSIS — M25532 Pain in left wrist: Secondary | ICD-10-CM | POA: Diagnosis not present

## 2022-07-12 NOTE — Telephone Encounter (Signed)
Short term this is ok. May increase BP or palpitations a little.

## 2022-07-12 NOTE — Telephone Encounter (Signed)
Patient walked in after a cortisone shot at [facility] requesting to know if shot will interfere with prednisone.  Called patient to discuss-patient wanted to make sure cortisone shot and prednisone will not effect him cardiovascular wise.    He had shot this am and starts prednisone this afternoon.  Advised will send to pharmD to review.

## 2022-07-12 NOTE — Telephone Encounter (Signed)
Voicemail is full unable to leave message

## 2022-07-13 NOTE — Telephone Encounter (Signed)
Pt was returning nurse call and would like a callback to be advised on what he should do. Please advise

## 2022-07-13 NOTE — Telephone Encounter (Signed)
Appears that the benefits outweigh the risks. Ok to proceed.

## 2022-07-13 NOTE — Telephone Encounter (Signed)
Pt informed of providers result & recommendations. Pt verbalized understanding.  

## 2022-07-13 NOTE — Telephone Encounter (Signed)
Pt states that he had his cortisone shot yesterday and is to take his prednisone taper for 15 days, for his rash. Pt states that he was in the [place] and states that about the last day'ish that he was there he woke up with a rash. It is very itchy and painful and this is the only thing that works to stop this. Doctors cannot find the cause of this rash so, he is doing this shot and taper and it seems to be working. "They have done all kinds of blood tests-no clear cause found" he will be continuing to get this treatment every 3 months until the rash/itching subsides. Will forward back to [facility] for this update.

## 2022-07-27 DIAGNOSIS — M25562 Pain in left knee: Secondary | ICD-10-CM | POA: Diagnosis not present

## 2022-08-02 DIAGNOSIS — L308 Other specified dermatitis: Secondary | ICD-10-CM | POA: Diagnosis not present

## 2022-08-22 DIAGNOSIS — F419 Anxiety disorder, unspecified: Secondary | ICD-10-CM | POA: Diagnosis not present

## 2022-08-30 DIAGNOSIS — R21 Rash and other nonspecific skin eruption: Secondary | ICD-10-CM | POA: Diagnosis not present

## 2022-08-30 DIAGNOSIS — B009 Herpesviral infection, unspecified: Secondary | ICD-10-CM | POA: Diagnosis not present

## 2022-09-11 DIAGNOSIS — G4733 Obstructive sleep apnea (adult) (pediatric): Secondary | ICD-10-CM | POA: Diagnosis not present

## 2022-09-20 ENCOUNTER — Telehealth: Payer: Self-pay

## 2022-09-20 NOTE — Telephone Encounter (Signed)
Patient would like to know your opinion on him getting Cortizone shots in his wrist and his dermatologist have him on prednisone. He would like your opinion on of they are safe to take together.

## 2022-09-21 NOTE — Telephone Encounter (Signed)
Left voicemail to return call to office.

## 2022-10-04 DIAGNOSIS — M25532 Pain in left wrist: Secondary | ICD-10-CM | POA: Diagnosis not present

## 2022-10-04 DIAGNOSIS — M25531 Pain in right wrist: Secondary | ICD-10-CM | POA: Diagnosis not present

## 2022-10-06 DIAGNOSIS — Z20822 Contact with and (suspected) exposure to covid-19: Secondary | ICD-10-CM | POA: Diagnosis not present

## 2022-10-06 DIAGNOSIS — J309 Allergic rhinitis, unspecified: Secondary | ICD-10-CM | POA: Diagnosis not present

## 2022-10-06 DIAGNOSIS — I491 Atrial premature depolarization: Secondary | ICD-10-CM | POA: Diagnosis not present

## 2022-10-06 DIAGNOSIS — I493 Ventricular premature depolarization: Secondary | ICD-10-CM | POA: Diagnosis not present

## 2022-10-06 DIAGNOSIS — R0981 Nasal congestion: Secondary | ICD-10-CM | POA: Diagnosis not present

## 2022-10-07 DIAGNOSIS — I491 Atrial premature depolarization: Secondary | ICD-10-CM | POA: Diagnosis not present

## 2022-10-31 DIAGNOSIS — I25118 Atherosclerotic heart disease of native coronary artery with other forms of angina pectoris: Secondary | ICD-10-CM | POA: Diagnosis not present

## 2022-10-31 DIAGNOSIS — E785 Hyperlipidemia, unspecified: Secondary | ICD-10-CM | POA: Diagnosis not present

## 2022-10-31 DIAGNOSIS — G4733 Obstructive sleep apnea (adult) (pediatric): Secondary | ICD-10-CM | POA: Diagnosis not present

## 2022-11-01 LAB — COMPREHENSIVE METABOLIC PANEL
Chloride: 104 mmol/L (ref 96–106)
Globulin, Total: 2.2 g/dL (ref 1.5–4.5)
Glucose: 92 mg/dL (ref 70–99)

## 2022-11-01 LAB — LIPID PANEL
LDL Chol Calc (NIH): 46 mg/dL (ref 0–99)
VLDL Cholesterol Cal: 12 mg/dL (ref 5–40)

## 2022-11-01 NOTE — Progress Notes (Signed)
Cardiology Office Note    Date:  11/05/2022   ID:  James [name] [birth year], MRN [id number]  PCP:  Daisy Floro, MD  Cardiologist:  Dr. Swaziland   Chief Complaint  Patient presents with   Coronary Artery Disease    History of Present Illness:  James [name] is a 62 y.o. male with PMH of basal cell skin CA, polycythemia and hepatitis C presented with NSTEMI on 03/29/2017.  Troponin on a peak at 45.54.  Hemoglobin A1c 5.5. Total cholesterol 155, HDL 26, LDL 91, triglyceride 191.  Urgent cardiac catheterization on the same day showed 100% proximal LAD lesion treated with thrombectomy and 3.5 x 16 mm Synergy DES, EF 45%.   He was also placed on aspirin, Brilinta and high-dose statin.    He was also referred  to Dr. Tresa Endo for management of obstructive sleep apnea.  He could not tolerate full facial mask in the past, however I think he can tolerate nasal device instead. Later that same month he presented to the  hospital with chest pain on the following morning after the office visit.  Troponin was initially elevated and peaked at 2.7.  He underwent cardiac catheterization, however this showed his LAD stent was widely patent, there is mild jailing of the diagonal artery.  Retrospective, it was unclear if patient had transient partial stent thrombosis or coronary spasm at the end of the stent.  Echocardiogram was repeated which showed EF 55-60%, severe hypokinesis of the akinesis of mid anteroseptal and apical anterior wall, moderate LVH.  Ejection fraction has improved from the previous 40-45% on echo.  Low-dose nitrates were added, patient was eventually discharged on 04/11/2017.  He was admitted in February 2019 again with atypical chest and epigastric pain. He ruled out for MI. Seen by our service and pain was felt to be noncardiac.   He has also  been seen by a Cardiologist in [place]- Dr. Fanny Skates. Lisinopril discontinued due to dizziness and HA. Later nitrates also reduced  and later discontinued. He underwent a CPX in 2020 which was normal.  When seen in July  he  went on a 2 week motocross trip in the [place]. Was at 17K feet. Noted some palpitations with a fluttering sensation. Noted oxygen level was 88% by pulse ox at altitude. Did not take CPAP on trip. Since then palpitations have resolved. He also had mild swelling of his ankle with travel that has resolved. He has been experiencing some "angina" with tightness in his throat and chest. This is actually better with exercise. We performed a Myoview study that showed normal perfusion at excellent exercise level.   He has done very well since then. Exercising well. Very rare angina. No dyspnea. No palpitations.    Past Medical History:  Diagnosis Date   Acute systolic congestive heart failure (HCC)    Anemia    slight anemia with hepatitis tx   Arthritis    oa   Cancer (HCC) 4 months ago   basal cell skin cancer removed from chest area   GERD (gastroesophageal reflux disease)    Hepatitis C 04/29/2012   had savalta treatment 2 years ago, now cured   Hyperlipidemia    NSTEMI (non-ST elevated myocardial infarction) (HCC) 03/29/2017   1/19 PCI/DESx1 to mLAD, EF 45%   Palpitations    at times     Past Surgical History:  Procedure Laterality Date   ARTHROSCOPIC REPAIR ACL Right 15 years ago   CORONARY STENT  INTERVENTION N/A 03/29/2017   Procedure: CORONARY STENT INTERVENTION;  Surgeon: Swaziland, Keaundra Stehle M, MD;  Location: [facility] INVASIVE CV LAB;  Service: Cardiovascular;  Laterality: N/A;   CORONARY THROMBECTOMY N/A 03/29/2017   Procedure: Coronary Thrombectomy;  Surgeon: Swaziland, Janautica Netzley M, MD;  Location: [facility] INVASIVE CV LAB;  Service: Cardiovascular;  Laterality: N/A;   LEFT HEART CATH AND CORONARY ANGIOGRAPHY N/A 03/29/2017   Procedure: LEFT HEART CATH AND CORONARY ANGIOGRAPHY;  Surgeon: Swaziland, Xzavior Reinig M, MD;  Location: [facility] INVASIVE CV LAB;  Service: Cardiovascular;  Laterality: N/A;   LEFT HEART CATH AND CORONARY  ANGIOGRAPHY N/A 04/10/2017   Procedure: LEFT HEART CATH AND CORONARY ANGIOGRAPHY;  Surgeon: Swaziland, Kloi Brodman M, MD;  Location: [facility] INVASIVE CV LAB;  Service: Cardiovascular;  Laterality: N/A;   TOTAL HIP ARTHROPLASTY Right 01/11/2015   Procedure: RIGHT TOTAL HIP ARTHROPLASTY ANTERIOR APPROACH;  Surgeon: Durene Romans, MD;  Location: [facility] ORS;  Service: Orthopedics;  Laterality: Right;    Current Medications: Outpatient Medications Prior to Visit  Medication Sig Dispense Refill   aspirin 81 MG EC tablet TAKE 1 TABLET BY MOUTH EVERY DAY 90 tablet 3   atorvastatin (LIPITOR) 80 MG tablet Take 1 tablet (80 mg total) by mouth daily. 90 tablet 3   Multiple Vitamin (MULTIVITAMIN) tablet Take 1 tablet by mouth daily.     nitroGLYCERIN (NITROSTAT) 0.4 MG SL tablet PLACE 1 TABLET UNDER THE TONGUE EVERY 5 MINUTES IF NEEDED 75 tablet 3   valACYclovir (VALTREX) 500 MG tablet Take 1 tablet by mouth daily as needed.     No facility-administered medications prior to visit.     Allergies:   Patient has no known allergies.   Social History   Socioeconomic History   Marital status: Single    Spouse name: Not on file   Number of children: Not on file   Years of education: Not on file   Highest education level: Not on file  Occupational History   Occupation:  Copywriter, advertising: [employer]  Tobacco Use   Smoking status: Former    Types: Cigars    Quit date: 03/29/2017    Years since quitting: 5.6   Smokeless tobacco: Former   Tobacco comments:    cigar  Vaping Use   Vaping status: Never Used  Substance and Sexual Activity   Alcohol use: No   Drug use: No   Sexual activity: Not on file  Other Topics Concern   Not on file  Social History Narrative   Not on file   Social Determinants of Health   Financial Resource Strain: Not on file  Food Insecurity: Not on file  Transportation Needs: Not on file  Physical Activity: Not on file  Stress: Not on file  Social Connections: Not on  file     Family History:  The patient's family history includes Alzheimer's disease in his mother; CAD in his father; Heart failure in his father.   ROS:   Please see the history of present illness.    ROS All other systems reviewed and are negative.   PHYSICAL EXAM:   VS:  BP 114/82 (BP Location: Left Arm, Patient Position: Sitting, Cuff Size: Large)   Pulse 62   Ht 5\' 10"  (1.778 m)   Wt 182 lb 6.4 oz (82.7 kg)   SpO2 98%   BMI 26.17 kg/m  GENERAL:  Well appearing WM in NAD HEENT:  PERRL, EOMI, sclera are clear. Oropharynx is clear. NECK:  No jugular venous  distention, carotid upstroke brisk and symmetric, no bruits, no thyromegaly or adenopathy LUNGS:  Clear to auscultation bilaterally CHEST:  Unremarkable HEART:  RRR,  PMI not displaced or sustained,S1 and S2 within normal limits, no S3, no S4: no clicks, no rubs, no murmurs ABD:  Soft, nontender. BS +, no masses or bruits. No hepatomegaly, no splenomegaly EXT:  2 + pulses throughout, no edema, no cyanosis no clubbing SKIN:  Warm and dry.  No rashes NEURO:  Alert and oriented x 3. Cranial nerves II through XII intact. PSYCH:  Cognitively intact   Wt Readings from Last 3 Encounters:  11/05/22 182 lb 6.4 oz (82.7 kg)  05/07/22 188 lb 12.8 oz (85.6 kg)  12/20/21 193 lb 9.6 oz (87.8 kg)      Studies/Labs Reviewed:   EKG:  EKG is not ordered today.     Recent Labs: 10/31/2022: ALT 30; BUN 17; Creatinine, Ser 0.92; Potassium 4.9; Sodium 143   Lipid Panel    Component Value Date/Time   CHOL 108 10/31/2022 0843   TRIG 53 10/31/2022 0843   HDL 50 10/31/2022 0843   CHOLHDL 2.2 10/31/2022 0843   CHOLHDL 6.0 03/30/2017 0251   VLDL 38 03/30/2017 0251   LDLCALC 46 10/31/2022 0843   Labs dated 06/03/17: CDRP 0.48, A1c 6% Cholesterol 91, triglycerides 64, HDL 34, LDL 44.   Additional studies/ records that were reviewed today include:   Cath 03/29/2017 Conclusion      Prox LAD lesion is 100% stenosed. Post  intervention, there is a 0% residual stenosis. A drug-eluting stent was successfully placed using a STENT SYNERGY DES 3.5X16. The left ventricular systolic function is normal. LV end diastolic pressure is normal. The left ventricular ejection fraction is 45-50% by visual estimate.   1. Single vessel occlusive CAD- 100% mid LAD with collaterals 2. Mild LV dysfunction. EF 45% 3. Normal LVEDP 4. Successful aspiration thrombectomy and stenting of the mid LAD with DES    Plan: DAPT with ASA and Brilinta. IV Aggrastat for 18 hours. May be a candidate for DC Sunday if no complications. High dose statin.      Echo 03/30/2017 LV EF: 40% -   45%   Study Conclusions   - Left ventricle: Septal and apical akinesis inferior wall   hypokinesis The cavity size was mildly dilated. Wall thickness   was increased in a pattern of mild LVH. Systolic function was   mildly to moderately reduced. The estimated ejection fraction was   in the range of 40% to 45%. Left ventricular diastolic function   parameters were normal. - Atrial septum: No defect or patent foramen ovale was identified.   Cath 04/10/2017 Conclusion     Previously placed Prox LAD stent (unknown type) is widely patent. LV end diastolic pressure is normal.   1. No significant obstructive CAD. The stent in the LAD is widely patent. 2. Normal LVEDP   Plan: continue medical therapy.    Myoview 10/10/21: Study Highlights      The study is normal. The study is low risk.   No ST deviation was noted.   Left ventricular function is normal. Nuclear stress EF: 47 %. The left ventricular ejection fraction is mildly decreased (45-54%). End diastolic cavity size is normal.   Prior study not available for comparison.   Low risk stress nuclear study with normal perfusion and mildly reduced left ventricular global systolic function. Recommend correlation with echo.  ASSESSMENT:    1. Coronary artery disease of native artery  of native heart  with stable angina pectoris (HCC)   2. Pure hypercholesterolemia        PLAN:  In order of problems listed above:   CAD: NSTEMI with DES of the proximal LAD in January 2019.  Repeat cardiac catheterization near the end of January 2019 showed patent stent. CPX in follow up 2020 summer was normal.  Last stress Myoview in July 2023 was normal.  Continue ASA and statin.  Hyperlipidemia:  last LDL 46. At goal.   Obstructive sleep apnea: Managed by Dr. Tresa Endo. On CPAP  Follow up in one year    Medication Adjustments/Labs and Tests Ordered: Current medicines are reviewed at length with the patient today.  Concerns regarding medicines are outlined above.  Medication changes, Labs and Tests ordered today are listed in the Patient Instructions below. There are no Patient Instructions on file for this visit.   Signed, Angelmarie Ponzo Swaziland, MD  11/05/2022 11:53 AM    [facility] [address] [place], [place], [place]  [address] Phone: [phone]; Fax: [phone]

## 2022-11-05 ENCOUNTER — Ambulatory Visit: Payer: BC Managed Care – PPO | Attending: Cardiology | Admitting: Cardiology

## 2022-11-05 ENCOUNTER — Encounter: Payer: Self-pay | Admitting: Cardiology

## 2022-11-05 VITALS — BP 114/82 | HR 62 | Ht 70.0 in | Wt 182.4 lb

## 2022-11-05 DIAGNOSIS — E7800 Pure hypercholesterolemia, unspecified: Secondary | ICD-10-CM | POA: Diagnosis not present

## 2022-11-05 DIAGNOSIS — I25118 Atherosclerotic heart disease of native coronary artery with other forms of angina pectoris: Secondary | ICD-10-CM

## 2022-11-05 NOTE — Patient Instructions (Signed)
Medication Instructions:  Your physician recommends that you continue on your current medications as directed. Please refer to the Current Medication list given to you today.  *If you need a refill on your cardiac medications before your next appointment, please call your pharmacy*    Follow-Up: At [facility], you and your health needs are our priority.  As part of our continuing mission to provide you with exceptional heart care, we have created designated Provider Care Teams.  These Care Teams include your primary Cardiologist (physician) and Advanced Practice Providers (APPs -  Physician Assistants and Nurse Practitioners) who all work together to provide you with the care you need, when you need it.  We recommend signing up for the patient portal called "MyChart".  Sign up information is provided on this After Visit Summary.  MyChart is used to connect with patients for Virtual Visits (Telemedicine).  Patients are able to view lab/test results, encounter notes, upcoming appointments, etc.  Non-urgent messages can be sent to your provider as well.   To learn more about what you can do with MyChart, go to [email or web address].    Your next appointment:   1 year(s)  Provider:   Peter Swaziland, MD    structions

## 2022-11-08 DIAGNOSIS — F419 Anxiety disorder, unspecified: Secondary | ICD-10-CM | POA: Diagnosis not present

## 2022-12-05 DIAGNOSIS — G4733 Obstructive sleep apnea (adult) (pediatric): Secondary | ICD-10-CM | POA: Diagnosis not present

## 2023-01-08 DIAGNOSIS — M25531 Pain in right wrist: Secondary | ICD-10-CM | POA: Diagnosis not present

## 2023-01-08 DIAGNOSIS — M25532 Pain in left wrist: Secondary | ICD-10-CM | POA: Diagnosis not present

## 2023-02-25 DIAGNOSIS — B349 Viral infection, unspecified: Secondary | ICD-10-CM | POA: Diagnosis not present

## 2023-02-25 DIAGNOSIS — J029 Acute pharyngitis, unspecified: Secondary | ICD-10-CM | POA: Diagnosis not present

## 2023-03-11 DIAGNOSIS — G4733 Obstructive sleep apnea (adult) (pediatric): Secondary | ICD-10-CM | POA: Diagnosis not present

## 2023-03-25 DIAGNOSIS — F419 Anxiety disorder, unspecified: Secondary | ICD-10-CM | POA: Diagnosis not present

## 2023-04-01 DIAGNOSIS — H16141 Punctate keratitis, right eye: Secondary | ICD-10-CM | POA: Diagnosis not present

## 2023-04-08 DIAGNOSIS — F419 Anxiety disorder, unspecified: Secondary | ICD-10-CM | POA: Diagnosis not present

## 2023-04-22 DIAGNOSIS — F419 Anxiety disorder, unspecified: Secondary | ICD-10-CM | POA: Diagnosis not present

## 2023-04-25 DIAGNOSIS — L821 Other seborrheic keratosis: Secondary | ICD-10-CM | POA: Diagnosis not present

## 2023-04-25 DIAGNOSIS — D485 Neoplasm of uncertain behavior of skin: Secondary | ICD-10-CM | POA: Diagnosis not present

## 2023-04-25 DIAGNOSIS — L814 Other melanin hyperpigmentation: Secondary | ICD-10-CM | POA: Diagnosis not present

## 2023-04-25 DIAGNOSIS — D1801 Hemangioma of skin and subcutaneous tissue: Secondary | ICD-10-CM | POA: Diagnosis not present

## 2023-07-07 ENCOUNTER — Other Ambulatory Visit: Payer: Self-pay | Admitting: Cardiology

## 2023-07-17 DIAGNOSIS — B351 Tinea unguium: Secondary | ICD-10-CM | POA: Diagnosis not present

## 2023-07-28 DIAGNOSIS — B351 Tinea unguium: Secondary | ICD-10-CM | POA: Diagnosis not present

## 2023-08-21 DIAGNOSIS — D485 Neoplasm of uncertain behavior of skin: Secondary | ICD-10-CM | POA: Diagnosis not present

## 2023-08-21 DIAGNOSIS — Z85828 Personal history of other malignant neoplasm of skin: Secondary | ICD-10-CM | POA: Diagnosis not present

## 2023-08-21 DIAGNOSIS — L814 Other melanin hyperpigmentation: Secondary | ICD-10-CM | POA: Diagnosis not present

## 2023-08-21 DIAGNOSIS — D225 Melanocytic nevi of trunk: Secondary | ICD-10-CM | POA: Diagnosis not present

## 2023-08-21 DIAGNOSIS — Z08 Encounter for follow-up examination after completed treatment for malignant neoplasm: Secondary | ICD-10-CM | POA: Diagnosis not present

## 2023-08-21 DIAGNOSIS — L821 Other seborrheic keratosis: Secondary | ICD-10-CM | POA: Diagnosis not present

## 2023-08-22 DIAGNOSIS — B351 Tinea unguium: Secondary | ICD-10-CM | POA: Diagnosis not present

## 2023-09-29 ENCOUNTER — Other Ambulatory Visit: Payer: Self-pay | Admitting: Cardiology

## 2023-10-01 ENCOUNTER — Telehealth: Payer: Self-pay | Admitting: Cardiology

## 2023-10-01 MED ORDER — ATORVASTATIN CALCIUM 80 MG PO TABS: 80.0000 mg | ORAL_TABLET | Freq: Every day | ORAL | 0 refills | Status: DC

## 2023-10-01 NOTE — Telephone Encounter (Signed)
 Pt's medication was sent to pt's pharmacy as requested. Confirmation received.

## 2023-10-01 NOTE — Telephone Encounter (Signed)
*  STAT* If patient is at the pharmacy, call can be transferred to refill team.   1. Which medications need to be refilled? (please list name of each medication and dose if known) Atorvastatin    2. Would you like to learn more about the convenience, safety, & potential cost savings by using the [facility]?     3. Are you open to using the [facility] (Type [facility].    4. Which pharmacy/location (including street and city if local pharmacy) is medication to be sent to? [facility]  [address] [place], [place]   5. Do they need a 30 day or 90 day supply? 90 days and refills

## 2023-10-08 DIAGNOSIS — L03116 Cellulitis of left lower limb: Secondary | ICD-10-CM | POA: Diagnosis not present

## 2023-10-27 ENCOUNTER — Other Ambulatory Visit: Payer: Self-pay | Admitting: Cardiology

## 2023-10-28 ENCOUNTER — Other Ambulatory Visit: Payer: Self-pay | Admitting: Cardiology

## 2023-10-29 ENCOUNTER — Other Ambulatory Visit: Payer: Self-pay | Admitting: Cardiology

## 2023-10-30 MED ORDER — NITROGLYCERIN 0.4 MG SL SUBL: SUBLINGUAL_TABLET | SUBLINGUAL | 0 refills | Status: AC

## 2023-11-25 DIAGNOSIS — F4320 Adjustment disorder, unspecified: Secondary | ICD-10-CM | POA: Diagnosis not present

## 2023-12-06 DIAGNOSIS — S50811A Abrasion of right forearm, initial encounter: Secondary | ICD-10-CM | POA: Diagnosis not present

## 2023-12-09 DIAGNOSIS — F4320 Adjustment disorder, unspecified: Secondary | ICD-10-CM | POA: Diagnosis not present
